# Patient Record
Sex: Female | Born: 1960
Health system: Southern US, Community
[De-identification: ages and names within clinical notes are randomized; demographics above are authoritative.]

## PROBLEM LIST (undated history)

## (undated) DIAGNOSIS — K219 Gastro-esophageal reflux disease without esophagitis: Secondary | ICD-10-CM

## (undated) DIAGNOSIS — T4145XA Adverse effect of unspecified anesthetic, initial encounter: Secondary | ICD-10-CM

## (undated) DIAGNOSIS — F429 Obsessive-compulsive disorder, unspecified: Secondary | ICD-10-CM

## (undated) DIAGNOSIS — R519 Headache, unspecified: Secondary | ICD-10-CM

## (undated) DIAGNOSIS — R569 Unspecified convulsions: Secondary | ICD-10-CM

## (undated) DIAGNOSIS — F909 Attention-deficit hyperactivity disorder, unspecified type: Secondary | ICD-10-CM

## (undated) DIAGNOSIS — S065X9A Traumatic subdural hemorrhage with loss of consciousness of unspecified duration, initial encounter: Secondary | ICD-10-CM

## (undated) DIAGNOSIS — I10 Essential (primary) hypertension: Secondary | ICD-10-CM

## (undated) DIAGNOSIS — R51 Headache: Secondary | ICD-10-CM

## (undated) DIAGNOSIS — T7840XA Allergy, unspecified, initial encounter: Secondary | ICD-10-CM

## (undated) DIAGNOSIS — T8859XA Other complications of anesthesia, initial encounter: Secondary | ICD-10-CM

## (undated) DIAGNOSIS — S0990XA Unspecified injury of head, initial encounter: Secondary | ICD-10-CM

## (undated) DIAGNOSIS — F4310 Post-traumatic stress disorder, unspecified: Secondary | ICD-10-CM

## (undated) DIAGNOSIS — F419 Anxiety disorder, unspecified: Secondary | ICD-10-CM

## (undated) HISTORY — PX: ABDOMINAL HYSTERECTOMY: SHX81

## (undated) HISTORY — DX: Allergy, unspecified, initial encounter: T78.40XA

## (undated) HISTORY — DX: Unspecified convulsions: R56.9

## (undated) HISTORY — DX: Attention-deficit hyperactivity disorder, unspecified type: F90.9

## (undated) HISTORY — DX: Obsessive-compulsive disorder, unspecified: F42.9

## (undated) HISTORY — DX: Post-traumatic stress disorder, unspecified: F43.10

---
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
## 2002-05-26 ENCOUNTER — Emergency Department (HOSPITAL_COMMUNITY): Admission: EM | Admit: 2002-05-26 | Discharge: 2002-05-26 | Payer: Self-pay | Admitting: Emergency Medicine

## 2006-08-07 ENCOUNTER — Emergency Department (HOSPITAL_COMMUNITY): Admission: EM | Admit: 2006-08-07 | Discharge: 2006-08-07 | Payer: Self-pay | Admitting: Emergency Medicine

## 2006-11-27 ENCOUNTER — Emergency Department (HOSPITAL_COMMUNITY): Admission: EM | Admit: 2006-11-27 | Discharge: 2006-11-27 | Payer: Self-pay | Admitting: Emergency Medicine

## 2007-02-09 ENCOUNTER — Encounter: Admission: RE | Admit: 2007-02-09 | Discharge: 2007-02-09 | Payer: Self-pay | Admitting: Family Medicine

## 2007-02-23 ENCOUNTER — Emergency Department (HOSPITAL_COMMUNITY): Admission: EM | Admit: 2007-02-23 | Discharge: 2007-02-23 | Payer: Self-pay | Admitting: Emergency Medicine

## 2008-02-23 ENCOUNTER — Emergency Department (HOSPITAL_COMMUNITY): Admission: EM | Admit: 2008-02-23 | Discharge: 2008-02-23 | Payer: Self-pay | Admitting: Emergency Medicine

## 2008-02-26 ENCOUNTER — Emergency Department (HOSPITAL_COMMUNITY): Admission: EM | Admit: 2008-02-26 | Discharge: 2008-02-26 | Payer: Self-pay | Admitting: Emergency Medicine

## 2008-02-27 ENCOUNTER — Emergency Department (HOSPITAL_COMMUNITY): Admission: EM | Admit: 2008-02-27 | Discharge: 2008-02-27 | Payer: Self-pay | Admitting: Emergency Medicine

## 2009-02-23 ENCOUNTER — Emergency Department (HOSPITAL_COMMUNITY): Admission: EM | Admit: 2009-02-23 | Discharge: 2009-02-23 | Payer: Self-pay | Admitting: Emergency Medicine

## 2009-04-13 ENCOUNTER — Emergency Department (HOSPITAL_COMMUNITY): Admission: EM | Admit: 2009-04-13 | Discharge: 2009-04-13 | Payer: Self-pay | Admitting: Emergency Medicine

## 2009-08-06 ENCOUNTER — Emergency Department (HOSPITAL_COMMUNITY): Admission: EM | Admit: 2009-08-06 | Discharge: 2009-08-06 | Payer: Self-pay | Admitting: Emergency Medicine

## 2009-08-08 ENCOUNTER — Emergency Department (HOSPITAL_COMMUNITY): Admission: EM | Admit: 2009-08-08 | Discharge: 2009-08-08 | Payer: Self-pay | Admitting: Emergency Medicine

## 2010-03-15 ENCOUNTER — Emergency Department (HOSPITAL_COMMUNITY): Admission: EM | Admit: 2010-03-15 | Discharge: 2010-03-15 | Payer: Self-pay | Admitting: Emergency Medicine

## 2010-04-11 ENCOUNTER — Emergency Department (HOSPITAL_COMMUNITY): Admission: EM | Admit: 2010-04-11 | Discharge: 2010-04-11 | Payer: Self-pay | Admitting: Emergency Medicine

## 2010-09-01 ENCOUNTER — Emergency Department (HOSPITAL_COMMUNITY)
Admission: EM | Admit: 2010-09-01 | Discharge: 2010-09-01 | Payer: Self-pay | Source: Home / Self Care | Admitting: Emergency Medicine

## 2010-10-14 ENCOUNTER — Encounter: Payer: Self-pay | Admitting: Family Medicine

## 2010-12-04 LAB — URINALYSIS, ROUTINE W REFLEX MICROSCOPIC
Bilirubin Urine: NEGATIVE
Nitrite: POSITIVE — AB
Protein, ur: 100 mg/dL — AB
Specific Gravity, Urine: 1.022 (ref 1.005–1.030)
Urobilinogen, UA: 0.2 mg/dL (ref 0.0–1.0)

## 2010-12-04 LAB — URINE MICROSCOPIC-ADD ON

## 2010-12-05 ENCOUNTER — Emergency Department (HOSPITAL_COMMUNITY)
Admission: EM | Admit: 2010-12-05 | Discharge: 2010-12-05 | Disposition: A | Discharge status: Home / Self Care | Payer: Self-pay | Attending: Emergency Medicine | Admitting: Emergency Medicine

## 2010-12-05 ENCOUNTER — Emergency Department (HOSPITAL_COMMUNITY): Payer: Self-pay

## 2010-12-05 DIAGNOSIS — K5289 Other specified noninfective gastroenteritis and colitis: Secondary | ICD-10-CM | POA: Insufficient documentation

## 2010-12-05 DIAGNOSIS — R109 Unspecified abdominal pain: Secondary | ICD-10-CM | POA: Insufficient documentation

## 2010-12-05 LAB — DIFFERENTIAL
Basophils Absolute: 0.1 10*3/uL (ref 0.0–0.1)
Basophils Relative: 1 % (ref 0–1)
Eosinophils Absolute: 0.2 10*3/uL (ref 0.0–0.7)
Eosinophils Relative: 2 % (ref 0–5)
Lymphocytes Relative: 21 % (ref 12–46)
Lymphs Abs: 2.8 K/uL (ref 0.7–4.0)
Monocytes Absolute: 0.8 10*3/uL (ref 0.1–1.0)
Monocytes Relative: 6 % (ref 3–12)
Neutro Abs: 9.3 K/uL — ABNORMAL HIGH (ref 1.7–7.7)
Neutrophils Relative %: 71 % (ref 43–77)

## 2010-12-05 LAB — URINALYSIS, ROUTINE W REFLEX MICROSCOPIC
Bilirubin Urine: NEGATIVE
Glucose, UA: NEGATIVE mg/dL
Hgb urine dipstick: NEGATIVE
Ketones, ur: NEGATIVE mg/dL
Nitrite: NEGATIVE
Protein, ur: NEGATIVE mg/dL
Specific Gravity, Urine: 1.027 (ref 1.005–1.030)
Urobilinogen, UA: 0.2 mg/dL (ref 0.0–1.0)
pH: 6 (ref 5.0–8.0)

## 2010-12-05 LAB — COMPREHENSIVE METABOLIC PANEL
ALT: 13 U/L (ref 0–35)
Albumin: 4.1 g/dL (ref 3.5–5.2)
Calcium: 9.4 mg/dL (ref 8.4–10.5)
Glucose, Bld: 115 mg/dL — ABNORMAL HIGH (ref 70–99)
Sodium: 137 mEq/L (ref 135–145)
Total Protein: 7.5 g/dL (ref 6.0–8.3)

## 2010-12-05 LAB — CBC
HCT: 43.7 % (ref 36.0–46.0)
Hemoglobin: 14.9 g/dL (ref 12.0–15.0)
MCH: 30.9 pg (ref 26.0–34.0)
MCHC: 34.1 g/dL (ref 30.0–36.0)
MCV: 90.7 fL (ref 78.0–100.0)
Platelets: 375 10*3/uL (ref 150–400)
RBC: 4.82 MIL/uL (ref 3.87–5.11)
RDW: 12.3 % (ref 11.5–15.5)
WBC: 13.1 K/uL — ABNORMAL HIGH (ref 4.0–10.5)

## 2010-12-05 LAB — WET PREP, GENITAL
Trich, Wet Prep: NONE SEEN
Yeast Wet Prep HPF POC: NONE SEEN

## 2010-12-05 LAB — COMPREHENSIVE METABOLIC PANEL WITH GFR
AST: 19 U/L (ref 0–37)
Alkaline Phosphatase: 64 U/L (ref 39–117)
BUN: 11 mg/dL (ref 6–23)
CO2: 28 meq/L (ref 19–32)
Chloride: 101 meq/L (ref 96–112)
Creatinine, Ser: 0.73 mg/dL (ref 0.4–1.2)
GFR calc Af Amer: >60 mL/min (ref >60)
GFR calc non Af Amer: >60 mL/min (ref >60)
Potassium: 3.4 meq/L — ABNORMAL LOW (ref 3.5–5.1)
Total Bilirubin: 0.6 mg/dL (ref 0.3–1.2)

## 2010-12-05 LAB — LIPASE, BLOOD: Lipase: 22 U/L (ref 11–59)

## 2010-12-05 MED ORDER — IOHEXOL 300 MG/ML  SOLN
100.0000 mL | Freq: Once | INTRAMUSCULAR | Status: AC | PRN
  Administered 2010-12-05: 100 mL via INTRAVENOUS

## 2010-12-06 LAB — GC/CHLAMYDIA PROBE AMP, GENITAL
Chlamydia, DNA Probe: NEGATIVE
GC Probe Amp, Genital: NEGATIVE

## 2010-12-09 LAB — DIFFERENTIAL
Basophils Absolute: 0 10*3/uL (ref 0.0–0.1)
Lymphocytes Relative: 20 % (ref 12–46)
Lymphs Abs: 1.8 10*3/uL (ref 0.7–4.0)
Neutro Abs: 7.1 10*3/uL (ref 1.7–7.7)

## 2010-12-09 LAB — COMPREHENSIVE METABOLIC PANEL
BUN: 7 mg/dL (ref 6–23)
CO2: 28 mEq/L (ref 19–32)
Calcium: 9.5 mg/dL (ref 8.4–10.5)
Chloride: 103 mEq/L (ref 96–112)
Creatinine, Ser: 0.82 mg/dL (ref 0.4–1.2)
GFR calc non Af Amer: >60 mL/min (ref >60)
Total Bilirubin: 0.8 mg/dL (ref 0.3–1.2)

## 2010-12-09 LAB — CBC
Hemoglobin: 15.1 g/dL — ABNORMAL HIGH (ref 12.0–15.0)
MCH: 30.8 pg (ref 26.0–34.0)
MCHC: 33.2 g/dL (ref 30.0–36.0)
MCV: 92.6 fL (ref 78.0–100.0)
Platelets: 365 10*3/uL (ref 150–400)
RBC: 4.91 MIL/uL (ref 3.87–5.11)

## 2010-12-09 LAB — URINALYSIS, ROUTINE W REFLEX MICROSCOPIC
Nitrite: NEGATIVE
Specific Gravity, Urine: 1.024 (ref 1.005–1.030)
Urobilinogen, UA: 1 mg/dL (ref 0.0–1.0)

## 2010-12-09 LAB — URINE MICROSCOPIC-ADD ON

## 2010-12-09 LAB — LIPASE, BLOOD: Lipase: 22 U/L (ref 11–59)

## 2010-12-09 LAB — URINE CULTURE

## 2010-12-26 LAB — URINALYSIS, ROUTINE W REFLEX MICROSCOPIC
Ketones, ur: 15 mg/dL — AB
Nitrite: NEGATIVE
Specific Gravity, Urine: 1.028 (ref 1.005–1.030)
pH: 7 (ref 5.0–8.0)

## 2010-12-26 LAB — CBC
HCT: 43.5 % (ref 36.0–46.0)
Hemoglobin: 14.6 g/dL (ref 12.0–15.0)
MCV: 87.9 fL (ref 78.0–100.0)
Platelets: 355 10*3/uL (ref 150–400)

## 2010-12-26 LAB — COMPREHENSIVE METABOLIC PANEL
Albumin: 4.2 g/dL (ref 3.5–5.2)
Alkaline Phosphatase: 65 U/L (ref 39–117)
BUN: 9 mg/dL (ref 6–23)
Chloride: 102 mEq/L (ref 96–112)
Creatinine, Ser: 0.78 mg/dL (ref 0.4–1.2)
GFR calc non Af Amer: >60 mL/min (ref >60)
Glucose, Bld: 106 mg/dL — ABNORMAL HIGH (ref 70–99)
Potassium: 3.9 mEq/L (ref 3.5–5.1)
Total Bilirubin: 0.7 mg/dL (ref 0.3–1.2)

## 2010-12-26 LAB — DIFFERENTIAL
Basophils Absolute: 0 10*3/uL (ref 0.0–0.1)
Basophils Relative: 0 % (ref 0–1)
Lymphocytes Relative: 25 % (ref 12–46)
Neutro Abs: 6.8 10*3/uL (ref 1.7–7.7)
Neutrophils Relative %: 69 % (ref 43–77)

## 2011-01-12 ENCOUNTER — Emergency Department (HOSPITAL_COMMUNITY)
Admission: EM | Admit: 2011-01-12 | Discharge: 2011-01-12 | Disposition: A | Discharge status: Home / Self Care | Payer: Self-pay | Attending: Emergency Medicine | Admitting: Emergency Medicine

## 2011-01-12 DIAGNOSIS — J45909 Unspecified asthma, uncomplicated: Secondary | ICD-10-CM | POA: Insufficient documentation

## 2011-01-12 DIAGNOSIS — J309 Allergic rhinitis, unspecified: Secondary | ICD-10-CM | POA: Insufficient documentation

## 2011-01-28 ENCOUNTER — Emergency Department (HOSPITAL_COMMUNITY)
Admission: EM | Admit: 2011-01-28 | Discharge: 2011-01-29 | Disposition: A | Discharge status: Home / Self Care | Payer: Self-pay | Attending: Emergency Medicine | Admitting: Emergency Medicine

## 2011-01-28 DIAGNOSIS — S0010XA Contusion of unspecified eyelid and periocular area, initial encounter: Secondary | ICD-10-CM | POA: Insufficient documentation

## 2011-01-28 DIAGNOSIS — M5030 Other cervical disc degeneration, unspecified cervical region: Secondary | ICD-10-CM | POA: Insufficient documentation

## 2011-01-28 DIAGNOSIS — M25569 Pain in unspecified knee: Secondary | ICD-10-CM | POA: Insufficient documentation

## 2011-01-28 DIAGNOSIS — IMO0002 Reserved for concepts with insufficient information to code with codable children: Secondary | ICD-10-CM | POA: Insufficient documentation

## 2011-01-28 DIAGNOSIS — T07XXXA Unspecified multiple injuries, initial encounter: Secondary | ICD-10-CM | POA: Insufficient documentation

## 2011-01-28 DIAGNOSIS — S060X9A Concussion with loss of consciousness of unspecified duration, initial encounter: Secondary | ICD-10-CM | POA: Insufficient documentation

## 2011-01-29 ENCOUNTER — Encounter (HOSPITAL_COMMUNITY): Payer: Self-pay

## 2011-01-29 ENCOUNTER — Emergency Department (HOSPITAL_COMMUNITY): Payer: Self-pay

## 2011-01-29 LAB — URINALYSIS, ROUTINE W REFLEX MICROSCOPIC
Hgb urine dipstick: NEGATIVE
Nitrite: NEGATIVE
Specific Gravity, Urine: 1.017 (ref 1.005–1.030)
Urobilinogen, UA: 0.2 mg/dL (ref 0.0–1.0)
pH: 6 (ref 5.0–8.0)

## 2011-01-29 LAB — RAPID URINE DRUG SCREEN, HOSP PERFORMED
Amphetamines: NOT DETECTED
Barbiturates: NOT DETECTED
Benzodiazepines: NOT DETECTED
Cocaine: POSITIVE — AB
Cocaine: NOT DETECTED
Opiates: NOT DETECTED
Tetrahydrocannabinol: NOT DETECTED

## 2011-01-29 LAB — BASIC METABOLIC PANEL
CO2: 24 mEq/L (ref 19–32)
Calcium: 9.6 mg/dL (ref 8.4–10.5)
GFR calc Af Amer: >60 mL/min (ref >60)
Potassium: 3.9 mEq/L (ref 3.5–5.1)
Sodium: 136 mEq/L (ref 135–145)

## 2011-01-29 LAB — CBC
HCT: 39.5 % (ref 36.0–46.0)
Hemoglobin: 13.7 g/dL (ref 12.0–15.0)
MCV: 89 fL (ref 78.0–100.0)
RBC: 4.44 MIL/uL (ref 3.87–5.11)
RDW: 12.2 % (ref 11.5–15.5)
WBC: 10.3 10*3/uL (ref 4.0–10.5)

## 2011-01-29 LAB — DIFFERENTIAL
Basophils Absolute: 0.1 10*3/uL (ref 0.0–0.1)
Eosinophils Relative: 1 % (ref 0–5)
Lymphocytes Relative: 23 % (ref 12–46)
Lymphs Abs: 2.4 10*3/uL (ref 0.7–4.0)
Neutro Abs: 7.1 10*3/uL (ref 1.7–7.7)
Neutrophils Relative %: 69 % (ref 43–77)

## 2011-01-31 ENCOUNTER — Emergency Department (HOSPITAL_COMMUNITY)
Admission: EM | Admit: 2011-01-31 | Discharge: 2011-01-31 | Disposition: A | Discharge status: Home / Self Care | Payer: Self-pay | Attending: Emergency Medicine | Admitting: Emergency Medicine

## 2011-01-31 ENCOUNTER — Emergency Department (HOSPITAL_COMMUNITY): Payer: Self-pay

## 2011-01-31 DIAGNOSIS — S02400A Malar fracture unspecified, initial encounter for closed fracture: Secondary | ICD-10-CM | POA: Insufficient documentation

## 2011-01-31 DIAGNOSIS — H20049 Secondary noninfectious iridocyclitis, unspecified eye: Secondary | ICD-10-CM | POA: Insufficient documentation

## 2011-01-31 DIAGNOSIS — S02401A Maxillary fracture, unspecified, initial encounter for closed fracture: Secondary | ICD-10-CM | POA: Insufficient documentation

## 2011-01-31 DIAGNOSIS — S058X9A Other injuries of unspecified eye and orbit, initial encounter: Secondary | ICD-10-CM | POA: Insufficient documentation

## 2011-01-31 DIAGNOSIS — S0280XA Fracture of other specified skull and facial bones, unspecified side, initial encounter for closed fracture: Secondary | ICD-10-CM | POA: Insufficient documentation

## 2011-02-26 ENCOUNTER — Emergency Department (HOSPITAL_COMMUNITY)
Admission: EM | Admit: 2011-02-26 | Discharge: 2011-02-26 | Disposition: A | Discharge status: Home / Self Care | Payer: Self-pay | Attending: Emergency Medicine | Admitting: Emergency Medicine

## 2011-02-26 DIAGNOSIS — G44309 Post-traumatic headache, unspecified, not intractable: Secondary | ICD-10-CM | POA: Insufficient documentation

## 2011-02-26 DIAGNOSIS — F0781 Postconcussional syndrome: Secondary | ICD-10-CM | POA: Insufficient documentation

## 2011-02-26 DIAGNOSIS — IMO0001 Reserved for inherently not codable concepts without codable children: Secondary | ICD-10-CM | POA: Insufficient documentation

## 2011-02-26 DIAGNOSIS — IMO0002 Reserved for concepts with insufficient information to code with codable children: Secondary | ICD-10-CM | POA: Insufficient documentation

## 2011-03-14 ENCOUNTER — Emergency Department (HOSPITAL_COMMUNITY)
Admission: EM | Admit: 2011-03-14 | Discharge: 2011-03-14 | Disposition: A | Discharge status: Home / Self Care | Payer: Self-pay | Attending: Emergency Medicine | Admitting: Emergency Medicine

## 2011-03-14 ENCOUNTER — Emergency Department (HOSPITAL_COMMUNITY): Payer: Self-pay

## 2011-03-14 DIAGNOSIS — X58XXXA Exposure to other specified factors, initial encounter: Secondary | ICD-10-CM | POA: Insufficient documentation

## 2011-03-14 DIAGNOSIS — S99919A Unspecified injury of unspecified ankle, initial encounter: Secondary | ICD-10-CM | POA: Insufficient documentation

## 2011-03-14 DIAGNOSIS — S7010XA Contusion of unspecified thigh, initial encounter: Secondary | ICD-10-CM | POA: Insufficient documentation

## 2011-03-14 DIAGNOSIS — H5710 Ocular pain, unspecified eye: Secondary | ICD-10-CM | POA: Insufficient documentation

## 2011-03-14 DIAGNOSIS — R51 Headache: Secondary | ICD-10-CM | POA: Insufficient documentation

## 2011-03-14 DIAGNOSIS — M79609 Pain in unspecified limb: Secondary | ICD-10-CM | POA: Insufficient documentation

## 2011-03-14 DIAGNOSIS — S8010XA Contusion of unspecified lower leg, initial encounter: Secondary | ICD-10-CM | POA: Insufficient documentation

## 2011-03-14 DIAGNOSIS — F0781 Postconcussional syndrome: Secondary | ICD-10-CM | POA: Insufficient documentation

## 2011-03-14 DIAGNOSIS — M549 Dorsalgia, unspecified: Secondary | ICD-10-CM | POA: Insufficient documentation

## 2011-03-14 DIAGNOSIS — S8990XA Unspecified injury of unspecified lower leg, initial encounter: Secondary | ICD-10-CM | POA: Insufficient documentation

## 2011-03-14 DIAGNOSIS — G8929 Other chronic pain: Secondary | ICD-10-CM | POA: Insufficient documentation

## 2011-04-07 ENCOUNTER — Emergency Department (HOSPITAL_COMMUNITY)
Admission: EM | Admit: 2011-04-07 | Discharge: 2011-04-07 | Disposition: A | Discharge status: Home / Self Care | Payer: Self-pay | Attending: Emergency Medicine | Admitting: Emergency Medicine

## 2011-04-07 DIAGNOSIS — X58XXXS Exposure to other specified factors, sequela: Secondary | ICD-10-CM | POA: Insufficient documentation

## 2011-04-07 DIAGNOSIS — F17200 Nicotine dependence, unspecified, uncomplicated: Secondary | ICD-10-CM | POA: Insufficient documentation

## 2011-04-07 DIAGNOSIS — F0781 Postconcussional syndrome: Secondary | ICD-10-CM | POA: Insufficient documentation

## 2011-04-07 DIAGNOSIS — G44309 Post-traumatic headache, unspecified, not intractable: Secondary | ICD-10-CM | POA: Insufficient documentation

## 2011-04-07 DIAGNOSIS — IMO0001 Reserved for inherently not codable concepts without codable children: Secondary | ICD-10-CM | POA: Insufficient documentation

## 2011-04-14 ENCOUNTER — Emergency Department (HOSPITAL_COMMUNITY)
Admission: EM | Admit: 2011-04-14 | Discharge: 2011-04-14 | Disposition: A | Discharge status: Home / Self Care | Payer: Self-pay | Attending: Emergency Medicine | Admitting: Emergency Medicine

## 2011-04-14 DIAGNOSIS — R51 Headache: Secondary | ICD-10-CM | POA: Insufficient documentation

## 2011-04-14 DIAGNOSIS — Z79899 Other long term (current) drug therapy: Secondary | ICD-10-CM | POA: Insufficient documentation

## 2011-04-24 ENCOUNTER — Emergency Department (HOSPITAL_COMMUNITY)
Admission: EM | Admit: 2011-04-24 | Discharge: 2011-04-24 | Disposition: A | Discharge status: Home / Self Care | Payer: Self-pay | Attending: Emergency Medicine | Admitting: Emergency Medicine

## 2011-04-24 DIAGNOSIS — G8929 Other chronic pain: Secondary | ICD-10-CM | POA: Insufficient documentation

## 2011-04-24 DIAGNOSIS — R51 Headache: Secondary | ICD-10-CM | POA: Insufficient documentation

## 2011-04-24 DIAGNOSIS — Z8781 Personal history of (healed) traumatic fracture: Secondary | ICD-10-CM | POA: Insufficient documentation

## 2011-05-12 ENCOUNTER — Emergency Department (HOSPITAL_COMMUNITY): Payer: Self-pay

## 2011-05-12 ENCOUNTER — Emergency Department (HOSPITAL_COMMUNITY)
Admission: EM | Admit: 2011-05-12 | Discharge: 2011-05-12 | Disposition: A | Discharge status: Home / Self Care | Payer: Self-pay | Attending: Emergency Medicine | Admitting: Emergency Medicine

## 2011-05-12 DIAGNOSIS — R609 Edema, unspecified: Secondary | ICD-10-CM | POA: Insufficient documentation

## 2011-05-12 DIAGNOSIS — X500XXA Overexertion from strenuous movement or load, initial encounter: Secondary | ICD-10-CM | POA: Insufficient documentation

## 2011-05-12 DIAGNOSIS — IMO0002 Reserved for concepts with insufficient information to code with codable children: Secondary | ICD-10-CM | POA: Insufficient documentation

## 2011-05-12 DIAGNOSIS — M25579 Pain in unspecified ankle and joints of unspecified foot: Secondary | ICD-10-CM | POA: Insufficient documentation

## 2011-05-25 DIAGNOSIS — S065XAA Traumatic subdural hemorrhage with loss of consciousness status unknown, initial encounter: Secondary | ICD-10-CM

## 2011-05-25 DIAGNOSIS — S065X9A Traumatic subdural hemorrhage with loss of consciousness of unspecified duration, initial encounter: Secondary | ICD-10-CM

## 2011-05-25 HISTORY — DX: Traumatic subdural hemorrhage with loss of consciousness of unspecified duration, initial encounter: S06.5X9A

## 2011-05-25 HISTORY — DX: Traumatic subdural hemorrhage with loss of consciousness status unknown, initial encounter: S06.5XAA

## 2011-06-05 ENCOUNTER — Emergency Department (HOSPITAL_COMMUNITY): Payer: Self-pay

## 2011-06-05 ENCOUNTER — Emergency Department (HOSPITAL_COMMUNITY)
Admission: EM | Admit: 2011-06-05 | Discharge: 2011-06-06 | Disposition: A | Discharge status: Other Acute Inpatient Hospital | Payer: Self-pay | Attending: Emergency Medicine | Admitting: Emergency Medicine

## 2011-06-05 DIAGNOSIS — S065X0A Traumatic subdural hemorrhage without loss of consciousness, initial encounter: Secondary | ICD-10-CM | POA: Insufficient documentation

## 2011-06-05 DIAGNOSIS — R51 Headache: Secondary | ICD-10-CM | POA: Insufficient documentation

## 2011-06-05 LAB — URINALYSIS, ROUTINE W REFLEX MICROSCOPIC
Bilirubin Urine: NEGATIVE
Protein, ur: NEGATIVE mg/dL
Urobilinogen, UA: 0.2 mg/dL (ref 0.0–1.0)

## 2011-06-06 ENCOUNTER — Inpatient Hospital Stay (HOSPITAL_COMMUNITY): Payer: Self-pay

## 2011-06-06 ENCOUNTER — Inpatient Hospital Stay (HOSPITAL_COMMUNITY)
Admission: AD | Admit: 2011-06-06 | Discharge: 2011-06-11 | DRG: 087 | Disposition: A | Discharge status: Home / Self Care | Payer: Self-pay | Source: Other Acute Inpatient Hospital | Attending: Neurological Surgery | Admitting: Neurological Surgery

## 2011-06-06 DIAGNOSIS — F0781 Postconcussional syndrome: Secondary | ICD-10-CM | POA: Diagnosis present

## 2011-06-06 DIAGNOSIS — S069X9S Unspecified intracranial injury with loss of consciousness of unspecified duration, sequela: Secondary | ICD-10-CM

## 2011-06-06 DIAGNOSIS — I10 Essential (primary) hypertension: Secondary | ICD-10-CM | POA: Diagnosis present

## 2011-06-06 DIAGNOSIS — S069XAS Unspecified intracranial injury with loss of consciousness status unknown, sequela: Secondary | ICD-10-CM

## 2011-06-06 DIAGNOSIS — F17200 Nicotine dependence, unspecified, uncomplicated: Secondary | ICD-10-CM | POA: Diagnosis present

## 2011-06-06 DIAGNOSIS — W19XXXS Unspecified fall, sequela: Secondary | ICD-10-CM

## 2011-06-06 DIAGNOSIS — S065X0A Traumatic subdural hemorrhage without loss of consciousness, initial encounter: Principal | ICD-10-CM | POA: Diagnosis present

## 2011-06-06 DIAGNOSIS — Z79899 Other long term (current) drug therapy: Secondary | ICD-10-CM

## 2011-06-06 DIAGNOSIS — Z880 Allergy status to penicillin: Secondary | ICD-10-CM

## 2011-06-06 DIAGNOSIS — G44319 Acute post-traumatic headache, not intractable: Secondary | ICD-10-CM | POA: Diagnosis present

## 2011-06-06 DIAGNOSIS — R569 Unspecified convulsions: Secondary | ICD-10-CM | POA: Diagnosis present

## 2011-06-06 DIAGNOSIS — Y9289 Other specified places as the place of occurrence of the external cause: Secondary | ICD-10-CM

## 2011-06-06 LAB — CBC
HCT: 40.3 % (ref 36.0–46.0)
Hemoglobin: 14.1 g/dL (ref 12.0–15.0)
RBC: 4.6 MIL/uL (ref 3.87–5.11)
WBC: 11 10*3/uL — ABNORMAL HIGH (ref 4.0–10.5)

## 2011-06-06 LAB — COMPREHENSIVE METABOLIC PANEL
Albumin: 3.7 g/dL (ref 3.5–5.2)
Alkaline Phosphatase: 63 U/L (ref 39–117)
BUN: 10 mg/dL (ref 6–23)
CO2: 23 mEq/L (ref 19–32)
Chloride: 104 mEq/L (ref 96–112)
GFR calc Af Amer: >60 mL/min (ref >60)
Glucose, Bld: 108 mg/dL — ABNORMAL HIGH (ref 70–99)
Potassium: 3.7 mEq/L (ref 3.5–5.1)
Total Bilirubin: 0.5 mg/dL (ref 0.3–1.2)

## 2011-06-06 LAB — GLUCOSE, CAPILLARY
Glucose-Capillary: 130 mg/dL — ABNORMAL HIGH (ref 70–99)
Glucose-Capillary: 106 mg/dL — ABNORMAL HIGH (ref 70–99)
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
## 2011-07-11 LAB — URINALYSIS, ROUTINE W REFLEX MICROSCOPIC
Bilirubin Urine: NEGATIVE
Hgb urine dipstick: NEGATIVE
Ketones, ur: NEGATIVE
Protein, ur: NEGATIVE
Urobilinogen, UA: 0.2

## 2011-07-11 LAB — DIFFERENTIAL
Basophils Relative: 0
Eosinophils Absolute: 0.3
Monocytes Relative: 6
Neutrophils Relative %: 65

## 2011-07-11 LAB — CBC
MCHC: 33.5
MCV: 86.6
RBC: 4.32

## 2011-07-11 LAB — URINE MICROSCOPIC-ADD ON

## 2011-07-12 ENCOUNTER — Other Ambulatory Visit (HOSPITAL_COMMUNITY): Payer: Self-pay | Admitting: Family Medicine

## 2011-07-12 DIAGNOSIS — S0990XA Unspecified injury of head, initial encounter: Secondary | ICD-10-CM

## 2011-07-15 ENCOUNTER — Inpatient Hospital Stay (HOSPITAL_COMMUNITY)
Admission: RE | Admit: 2011-07-15 | Discharge: 2011-07-15 | Payer: Self-pay | Source: Ambulatory Visit | Attending: Family Medicine | Admitting: Family Medicine

## 2011-07-22 ENCOUNTER — Other Ambulatory Visit (HOSPITAL_COMMUNITY): Payer: Self-pay | Admitting: Family Medicine

## 2011-07-22 DIAGNOSIS — S0990XA Unspecified injury of head, initial encounter: Secondary | ICD-10-CM

## 2011-07-24 NOTE — Consult Note (Signed)
  Joy Barber, GABRIELSEN NO.:  [id number]  MEDICAL RECORD NO.:  [id number]  LOCATION:  3103                         FACILITY:  [facility]  PHYSICIAN:  Stefani Dama, M.D.  DATE OF BIRTH:  [birth year]  DATE OF CONSULTATION:  06/06/2011 DATE OF DISCHARGE:                                CONSULTATION   REQUESTOR:  Devoria Albe, MD  REASON FOR REQUEST:  Subdural hematoma.  HISTORY OF PRESENT ILLNESS:  The patient is a 50 year old individual who called about at approximately 2 in the morning regarding a presence of a subdural hematoma.  She was seen in the emergency department for increasing headache.  There was a remote history of trauma back in May of this year, and a subsequent CT scan did not show any subdural hematoma, but now, there is a subacute lesion on the right frontal region, which is measures at best a centimeter in thickness.  She was also explained that she has severe and unrelenting headaches and that she had two seizure episodes sometime in the past.  Because of the nature of this hematoma being a new finding, though the complaints are chronic, I have advised that the patient be admitted to the hospital to undergo further workup and monitoring to see if there is any evidence for seizure activity.  She will be transferred to [facility] in the intensive care unit.     Stefani Dama, M.D.     Merla Riches  D:  06/06/2011  T:  06/06/2011  Job:  [id number]  Electronically Signed by Barnett Abu M.D. on 07/24/2011 07:00:37 AM
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
## 2011-07-25 ENCOUNTER — Ambulatory Visit (HOSPITAL_COMMUNITY)
Admission: RE | Admit: 2011-07-25 | Discharge: 2011-07-25 | Disposition: A | Discharge status: Home / Self Care | Payer: Self-pay | Source: Ambulatory Visit | Attending: Family Medicine | Admitting: Family Medicine

## 2011-07-25 DIAGNOSIS — H538 Other visual disturbances: Secondary | ICD-10-CM | POA: Insufficient documentation

## 2011-07-25 DIAGNOSIS — S0990XA Unspecified injury of head, initial encounter: Secondary | ICD-10-CM

## 2012-01-08 ENCOUNTER — Other Ambulatory Visit: Payer: Self-pay | Admitting: Obstetrics and Gynecology

## 2012-01-08 DIAGNOSIS — Z1231 Encounter for screening mammogram for malignant neoplasm of breast: Secondary | ICD-10-CM

## 2012-01-14 ENCOUNTER — Ambulatory Visit: Payer: Self-pay

## 2012-01-14 ENCOUNTER — Ambulatory Visit (HOSPITAL_COMMUNITY): Payer: Self-pay | Attending: Obstetrics and Gynecology

## 2012-06-13 ENCOUNTER — Ambulatory Visit: Payer: Self-pay | Admitting: Emergency Medicine

## 2012-06-13 VITALS — BP 138/89 | HR 71 | Temp 98.2°F | Resp 18 | Ht 64.0 in | Wt 139.0 lb

## 2012-06-13 DIAGNOSIS — Z23 Encounter for immunization: Secondary | ICD-10-CM

## 2012-06-13 DIAGNOSIS — S0190XA Unspecified open wound of unspecified part of head, initial encounter: Secondary | ICD-10-CM
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
## 2012-07-07 ENCOUNTER — Emergency Department (HOSPITAL_COMMUNITY)
Admission: EM | Admit: 2012-07-07 | Discharge: 2012-07-07 | Disposition: A | Discharge status: Home / Self Care | Payer: Self-pay | Attending: Emergency Medicine | Admitting: Emergency Medicine

## 2012-07-07 ENCOUNTER — Encounter (HOSPITAL_COMMUNITY): Payer: Self-pay

## 2012-07-07 ENCOUNTER — Emergency Department (HOSPITAL_COMMUNITY): Payer: Self-pay

## 2012-07-07 DIAGNOSIS — J069 Acute upper respiratory infection, unspecified: Secondary | ICD-10-CM | POA: Insufficient documentation

## 2012-07-07 LAB — MONONUCLEOSIS SCREEN: Mono Screen: NEGATIVE

## 2012-07-07 LAB — CBC WITH DIFFERENTIAL/PLATELET
Basophils Absolute: 0 10*3/uL (ref 0.0–0.1)
Basophils Relative: 0 % (ref 0–1)
Eosinophils Absolute: 0.1 10*3/uL (ref 0.0–0.7)
MCH: 30.3 pg (ref 26.0–34.0)
MCHC: 34.2 g/dL (ref 30.0–36.0)
Neutrophils Relative %: 69 % (ref 43–77)
Platelets: 385 10*3/uL (ref 150–400)

## 2012-07-07 LAB — BASIC METABOLIC PANEL
GFR calc Af Amer: >90 mL/min (ref >90)
GFR calc non Af Amer: >90 mL/min (ref >90)
Potassium: 4.1 mEq/L (ref 3.5–5.1)
Sodium: 137 mEq/L (ref 135–145)

## 2012-07-07 LAB — RAPID STREP SCREEN (MED CTR MEBANE ONLY): Streptococcus, Group A Screen (Direct): NEGATIVE

## 2012-07-07 MED ORDER — AZITHROMYCIN 250 MG PO TABS: 250.0000 mg | ORAL_TABLET | Freq: Every day | ORAL | Status: DC

## 2012-07-07 MED ORDER — SODIUM CHLORIDE 0.9 % IV BOLUS (SEPSIS)
1000.0000 mL | Freq: Once | INTRAVENOUS | Status: AC
  Administered 2012-07-07: 1000 mL via INTRAVENOUS

## 2012-07-07 MED ORDER — TRAMADOL HCL 50 MG PO TABS: 50.0000 mg | ORAL_TABLET | Freq: Four times a day (QID) | ORAL | Status: DC | PRN

## 2012-07-07 MED ORDER — ONDANSETRON HCL 4 MG/2ML IJ SOLN
4.0000 mg | Freq: Once | INTRAMUSCULAR | Status: AC
  Administered 2012-07-07: 4 mg via INTRAVENOUS
  Filled 2012-07-07: qty 2

## 2012-07-07 MED ORDER — ALBUTEROL SULFATE HFA 108 (90 BASE) MCG/ACT IN AERS
2.0000 | INHALATION_SPRAY | RESPIRATORY_TRACT | Status: DC | PRN
  Administered 2012-07-07: 2 via RESPIRATORY_TRACT
  Filled 2012-07-07: qty 6.7

## 2012-07-07 MED ORDER — TRAMADOL HCL 50 MG PO TABS
50.0000 mg | ORAL_TABLET | Freq: Once | ORAL | Status: AC
  Administered 2012-07-07: 50 mg via ORAL
  Filled 2012-07-07: qty 1

## 2012-07-07 MED ORDER — AZITHROMYCIN 250 MG PO TABS
500.0000 mg | ORAL_TABLET | Freq: Once | ORAL | Status: AC
  Administered 2012-07-07: 500 mg via ORAL
  Filled 2012-07-07: qty 2

## 2012-07-07 NOTE — ED Notes (Signed)
Patient reports she has felt like she has the flu.  Her throat hurts and bilateral neck area is swollen and painful.  Pt has been coughing resulting in vomiting at times.  Pt is normally active and exercises and reports she has been sick since receiving TDAP and MMR vacination on around Sept. 21.

## 2012-07-07 NOTE — ED Notes (Signed)
Pt present with c/o lethargic, swolllen glands, nausea, and coughing.  Pt reports "I got sick 48 hours after taking MMR and Tdap vaccinations for school."  Pt reports chills and fever

## 2012-07-07 NOTE — ED Provider Notes (Signed)
History     CSN: [id number]  Arrival date & time 07/07/12  1535   First MD Initiated Contact with Patient 07/07/12 1716      Chief Complaint  Patient presents with  . Lymphadenopathy  . Nausea    (Consider location/radiation/quality/duration/timing/severity/associated sxs/prior treatment) HPI  Pt to the ER with multiple complaints. She received the MMR 3 weeks ago and ever since then she has been feeling fatigued, sore throat, swollen tonsils, post tussive vomiting with nausea and myalgias. She denies having fevers but admits to subjective fevers and chills. She does have a PMH of TBI from 2012.  She says that that has been stable. Pt is healthy at baseline and exercises almost daily, no chronic ongoing conditions. NAD/VSS  No past medical history on file.  Past Surgical History  Procedure Date  . Brain surgery post concusive syndrome, tramatic brain injury, seizures, subdural hematoma,    No family history on file.  History  Substance Use Topics  . Smoking status: Former Games developer  . Smokeless tobacco: Not on file  . Alcohol Use: Not on file    OB History    Grav Para Term Preterm Abortions TAB SAB Ect Mult Living                  Review of Systems  Constitutional: Positive for fever and chills.  HENT: Negative for ear pain and ear discharge.   Eyes: Negative for redness and itching.  Respiratory: Positive for cough. Negative for chest tightness, shortness of breath and wheezing.   Cardiovascular: Negative for chest pain and leg swelling.  Genitourinary: Negative for dysuria.  Musculoskeletal: Negative for back pain and arthralgias.  Neurological: Negative for dizziness.  Hematological: Negative for adenopathy.  Psychiatric/Behavioral: Negative for agitation.      Allergies  Penicillins  Home Medications   Current Outpatient Rx  Name Route Sig Dispense Refill  . PRESCRIPTION MEDICATION Oral Take 1 capsule by mouth daily. Piracetam 250mg  capsule for  "mental health".    . TRAMADOL HCL 50 MG PO TABS Oral Take 50 mg by mouth every 6 (six) hours as needed. For pain.    Marland Kitchen AZITHROMYCIN 250 MG PO TABS Oral Take 1 tablet (250 mg total) by mouth daily. Take first 2 tablets together, then 1 every day until finished. 4 tablet 0    Start on 07/08/2012  . TRAMADOL HCL 50 MG PO TABS Oral Take 1 tablet (50 mg total) by mouth every 6 (six) hours as needed for pain. 15 tablet 0    BP 121/75  Pulse 69  Temp 98.3 F (36.8 C) (Oral)  Resp 14  SpO2 100%  Physical Exam  Nursing note and vitals reviewed. Constitutional: She appears well-developed and well-nourished. No distress.  HENT:  Head: Normocephalic and atraumatic.  Eyes: Pupils are equal, round, and reactive to light.  Neck: Normal range of motion. Neck supple.  Cardiovascular: Normal rate and regular rhythm.   Pulmonary/Chest: Effort normal. No respiratory distress. She has no wheezes.  Abdominal: Soft. There is no tenderness. There is no rebound.  Lymphadenopathy:       Head (right side): Tonsillar adenopathy present.       Head (left side): Tonsillar adenopathy present.    She has no cervical adenopathy.    She has no axillary adenopathy.  Neurological: She is alert.  Skin: Skin is warm and dry.    ED Course  Procedures (including critical care time)  Labs Reviewed  CBC WITH DIFFERENTIAL - Abnormal;  Notable for the following:    WBC 13.5 (*)     Neutro Abs 9.4 (*)     Monocytes Absolute 1.1 (*)     All other components within normal limits  BASIC METABOLIC PANEL  RAPID STREP SCREEN  MONONUCLEOSIS SCREEN   Dg Chest 2 View  07/07/2012  *RADIOLOGY REPORT*  Clinical Data: Cough.  CHEST - 2 VIEW  Comparison: 02/23/2007  Findings: The cardiac silhouette, mediastinal and hilar contours are normal.  The lungs are clear.  No pleural effusion.  The bony thorax is intact.  IMPRESSION: No acute cardiopulmonary findings.   Original Report Authenticated By: P. Loralie Champagne, M.D.       1. URI (upper respiratory infection)       MDM  Labs unremarkable. Pt most likely has URI. No concerning findings on labs or physical exam. Will give abx, inhaler and tramadol. Pt needs to rest. Admits to not eating well like she should.  Pt has been advised of the symptoms that warrant their return to the ED. Patient has voiced understanding and has agreed to follow-up with the PCP or specialist.         Dorthula Matas, PA 07/07/12 1939

## 2012-07-08 MED ORDER — CEPHALEXIN 500 MG PO CAPS: 500.0000 mg | ORAL_CAPSULE | Freq: Four times a day (QID) | ORAL | Status: DC

## 2012-07-08 NOTE — ED Provider Notes (Signed)
Medical screening examination/treatment/procedure(s) were performed by non-physician practitioner and as supervising physician I was immediately available for consultation/collaboration.  Doug Sou, MD 07/08/12 [phone]

## 2012-08-05 ENCOUNTER — Telehealth: Payer: Self-pay

## 2012-08-05 ENCOUNTER — Encounter: Payer: Self-pay | Admitting: Radiology

## 2012-08-05 NOTE — Telephone Encounter (Signed)
Patient dropped off paperwork about 3-4 days ago for Dr. Cleta Alberts to fill out from [facility] stating patients accommodations. Patient states that person at front desk said she'd hand deliver papers to Dr. Cleta Alberts but I don't have any notes stating this happened and nothing was found in Dr. Ellis Parents box. Please check and see if anyone knows about these papers and where they might be. (They are not in pick up drawer either, but patient states that she also had a self addressed envelope, so there is a chance it was filled out and mailed already.) Please call patient to give her an update on these papers. [phone]

## 2012-08-05 NOTE — Telephone Encounter (Signed)
Called pt to advise I had completed letter and mailed to her.
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
## 2012-10-19 ENCOUNTER — Encounter (HOSPITAL_COMMUNITY): Payer: Self-pay | Admitting: *Deleted

## 2012-10-19 ENCOUNTER — Emergency Department (HOSPITAL_COMMUNITY)
Admission: EM | Admit: 2012-10-19 | Discharge: 2012-10-20 | Disposition: A | Discharge status: Home / Self Care | Payer: Self-pay | Attending: Emergency Medicine | Admitting: Emergency Medicine

## 2012-10-19 DIAGNOSIS — Z87820 Personal history of traumatic brain injury: Secondary | ICD-10-CM | POA: Insufficient documentation

## 2012-10-19 DIAGNOSIS — Z79899 Other long term (current) drug therapy: Secondary | ICD-10-CM | POA: Insufficient documentation

## 2012-10-19 DIAGNOSIS — F17200 Nicotine dependence, unspecified, uncomplicated: Secondary | ICD-10-CM | POA: Insufficient documentation

## 2012-10-19 DIAGNOSIS — Z9889 Other specified postprocedural states: Secondary | ICD-10-CM | POA: Insufficient documentation

## 2012-10-19 DIAGNOSIS — F0781 Postconcussional syndrome: Secondary | ICD-10-CM | POA: Insufficient documentation

## 2012-10-19 HISTORY — DX: Unspecified injury of head, initial encounter: S09.90XA

## 2012-10-19 HISTORY — DX: Traumatic subdural hemorrhage with loss of consciousness of unspecified duration, initial encounter: S06.5X9A

## 2012-10-19 LAB — POCT I-STAT, CHEM 8
Creatinine, Ser: 1 mg/dL (ref 0.50–1.10)
Hemoglobin: 14.6 g/dL (ref 12.0–15.0)
Sodium: 144 mEq/L (ref 135–145)
TCO2: 28 mmol/L (ref 0–100)

## 2012-10-19 MED ORDER — DIPHENHYDRAMINE HCL 50 MG/ML IJ SOLN
25.0000 mg | Freq: Once | INTRAMUSCULAR | Status: AC
Start: 2012-10-19 — End: 2012-10-20
  Administered 2012-10-20: 25 mg via INTRAVENOUS
  Filled 2012-10-19: qty 1

## 2012-10-19 MED ORDER — METOCLOPRAMIDE HCL 5 MG/ML IJ SOLN
10.0000 mg | Freq: Once | INTRAMUSCULAR | Status: AC
  Administered 2012-10-20: 10 mg via INTRAVENOUS
  Filled 2012-10-19: qty 2

## 2012-10-19 MED ORDER — SODIUM CHLORIDE 0.9 % IV BOLUS (SEPSIS)
1000.0000 mL | Freq: Once | INTRAVENOUS | Status: AC
  Administered 2012-10-19: 1000 mL via INTRAVENOUS

## 2012-10-19 MED ORDER — DIAZEPAM 5 MG PO TABS
5.0000 mg | ORAL_TABLET | Freq: Once | ORAL | Status: AC
  Administered 2012-10-20: 5 mg via ORAL
  Filled 2012-10-19: qty 1

## 2012-10-19 MED ORDER — KETOROLAC TROMETHAMINE 30 MG/ML IJ SOLN
30.0000 mg | Freq: Once | INTRAMUSCULAR | Status: AC
  Administered 2012-10-20: 30 mg via INTRAVENOUS
  Filled 2012-10-19: qty 1
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
## 2012-10-20 MED ORDER — BUSPIRONE HCL 5 MG PO TABS: 5.0000 mg | ORAL_TABLET | Freq: Three times a day (TID) | ORAL | Status: DC

## 2012-10-20 MED ORDER — TRAMADOL HCL 50 MG PO TABS: 50.0000 mg | ORAL_TABLET | Freq: Four times a day (QID) | ORAL | Status: DC | PRN

## 2012-10-20 NOTE — ED Provider Notes (Signed)
Medical screening examination/treatment/procedure(s) were performed by non-physician practitioner and as supervising physician I was immediately available for consultation/collaboration.   Gilda Crease, MD 10/20/12 [phone]

## 2012-11-10 ENCOUNTER — Ambulatory Visit: Payer: Self-pay | Admitting: Family Medicine

## 2012-11-10 VITALS — BP 130/80 | HR 81 | Temp 97.6°F | Resp 17 | Ht 64.5 in | Wt 139.0 lb

## 2012-11-10 DIAGNOSIS — J029 Acute pharyngitis, unspecified: Secondary | ICD-10-CM

## 2012-11-10 DIAGNOSIS — F909 Attention-deficit hyperactivity disorder, unspecified type: Secondary | ICD-10-CM

## 2012-11-10 DIAGNOSIS — S069X0S Unspecified intracranial injury without loss of consciousness, sequela: Secondary | ICD-10-CM

## 2012-11-10 DIAGNOSIS — S069XAS Unspecified intracranial injury with loss of consciousness status unknown, sequela: Secondary | ICD-10-CM

## 2012-11-10 DIAGNOSIS — S069X9S Unspecified intracranial injury with loss of consciousness of unspecified duration, sequela: Secondary | ICD-10-CM

## 2012-11-10 DIAGNOSIS — F411 Generalized anxiety disorder: Secondary | ICD-10-CM

## 2012-11-10 MED ORDER — TRAMADOL HCL 50 MG PO TABS: 50.0000 mg | ORAL_TABLET | Freq: Three times a day (TID) | ORAL | Status: DC | PRN

## 2012-11-10 MED ORDER — BUSPIRONE HCL 5 MG PO TABS: 5.0000 mg | ORAL_TABLET | Freq: Three times a day (TID) | ORAL | Status: DC

## 2012-11-10 MED ORDER — AZITHROMYCIN 250 MG PO TABS: ORAL_TABLET | ORAL | Status: DC
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
## 2013-06-11 ENCOUNTER — Ambulatory Visit: Payer: Self-pay | Admitting: Physician Assistant

## 2013-06-11 VITALS — BP 160/90 | HR 70 | Temp 98.8°F | Resp 16 | Ht 65.0 in | Wt 142.0 lb

## 2013-06-11 DIAGNOSIS — S069X0A Unspecified intracranial injury without loss of consciousness, initial encounter: Secondary | ICD-10-CM

## 2013-06-11 DIAGNOSIS — Z87898 Personal history of other specified conditions: Secondary | ICD-10-CM | POA: Insufficient documentation

## 2013-06-11 DIAGNOSIS — S069X9S Unspecified intracranial injury with loss of consciousness of unspecified duration, sequela: Secondary | ICD-10-CM

## 2013-06-11 DIAGNOSIS — F4310 Post-traumatic stress disorder, unspecified: Secondary | ICD-10-CM

## 2013-06-11 DIAGNOSIS — S069XAA Unspecified intracranial injury with loss of consciousness status unknown, initial encounter: Secondary | ICD-10-CM | POA: Insufficient documentation

## 2013-06-11 DIAGNOSIS — S069X0S Unspecified intracranial injury without loss of consciousness, sequela: Secondary | ICD-10-CM

## 2013-06-11 DIAGNOSIS — F909 Attention-deficit hyperactivity disorder, unspecified type: Secondary | ICD-10-CM

## 2013-06-11 DIAGNOSIS — Z8669 Personal history of other diseases of the nervous system and sense organs: Secondary | ICD-10-CM

## 2013-06-11 DIAGNOSIS — S069XAS Unspecified intracranial injury with loss of consciousness status unknown, sequela: Secondary | ICD-10-CM

## 2013-06-11 DIAGNOSIS — J069 Acute upper respiratory infection, unspecified: Secondary | ICD-10-CM

## 2013-06-11 DIAGNOSIS — S069X9A Unspecified intracranial injury with loss of consciousness of unspecified duration, initial encounter: Secondary | ICD-10-CM

## 2013-06-11 MED ORDER — TRAMADOL HCL 50 MG PO TABS: 50.0000 mg | ORAL_TABLET | Freq: Three times a day (TID) | ORAL | Status: DC | PRN

## 2013-06-11 NOTE — Patient Instructions (Signed)
Get plenty of rest and drink at least 64 ounces of water daily. Schedule a follow-up with Dr. Cleta Alberts at your convenience.

## 2013-06-11 NOTE — Progress Notes (Signed)
Subjective:    Patient ID: Joy Barber, female    DOB: 1960-10-20, 52 y.o.   MRN: [id number]  HPI Presents needing a couple of notes. Missed work and class all week due to illness.  Lots of congestion, drainage, laryngitis, achiness.  Has used homeopathic products along with Airborne, vitamin C, OTC cold/allergy product (acetaminophen 325 mg, Guaifenesin 200 mg, phenylephrine 5 mg) and cetirizine. Doesn't want any prescription medications. Needs a note for work and another for class.  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 The note Dr. Cleta Alberts wrote her doesn't specifically state that she needs to test in a location with fewer distractions, and she needs something to address that.  Father has DMT2 and she's concerned that she has frequent urination. Doesn't want to provide a urine or blood sample today to address that.  Having hot flashes, "I'm like a woman on Viagra." Irritable. Sleeping is a big problem.  Uses valerian root. Not interested in any prescription product for treatment of these.  Review of Systems As above.  No CP, SOB, dizziness, GI symptoms.    Objective:   Physical Exam  Vitals reviewed. Constitutional: She is oriented to person, place, and time. Vital signs are normal. She appears well-developed and well-nourished. She is active and cooperative. No distress.  HENT:  Head: Normocephalic and atraumatic.  Right Ear: Hearing, tympanic membrane, external ear and ear canal normal.  Left Ear: Hearing, tympanic membrane, external ear and ear canal normal.  Nose: Mucosal edema present. No rhinorrhea.  No foreign bodies. Right sinus exhibits no maxillary sinus tenderness and no frontal sinus tenderness. Left sinus exhibits no maxillary sinus tenderness and no frontal sinus tenderness.  Mouth/Throat: Uvula is midline, oropharynx is clear and moist and mucous membranes are normal. No edematous. No oropharyngeal  exudate.  Eyes: Conjunctivae, EOM and lids are normal. Pupils are equal, round, and reactive to light. Right eye exhibits no discharge. Left eye exhibits no discharge. No scleral icterus.  Neck: Trachea normal, normal range of motion and full passive range of motion without pain. Neck supple. No mass and no thyromegaly present.  Cardiovascular: Normal rate, regular rhythm and normal heart sounds.   Pulmonary/Chest: Effort normal and breath sounds normal.  Lymphadenopathy:       Head (right side): No submandibular, no tonsillar, no preauricular, no posterior auricular and no occipital adenopathy present.       Head (left side): No submandibular, no tonsillar, no preauricular and no occipital adenopathy present.    She has no cervical adenopathy.       Right: No supraclavicular adenopathy present.       Left: No supraclavicular adenopathy present.  Neurological: She is alert and oriented to person, place, and time. She has normal strength. No sensory deficit.  Skin: Skin is warm, dry and intact. No rash noted.  Psychiatric: She has a normal mood and affect. Her speech is normal and behavior is normal.  Quite verbose.       Assessment & Plan:  ADHD (attention deficit hyperactivity disorder) Letter written to supplement Dr. Ellis Parents reagarding the need for testing in a room with no distractions.  TBI (traumatic brain injury), sequela - Plan: REFILL traMADol (ULTRAM) 50 MG tablet  Viral URI She'll continue her current OTC and homeopathic products since she's improving.  Letters   She will follow-up with Dr. Cleta Alberts regarding the increased urinary frequency.  Fernande Bras,  PA-C Physician Assistant-Certified [facility]

## 2013-07-13 ENCOUNTER — Ambulatory Visit: Payer: Self-pay | Admitting: Emergency Medicine

## 2014-12-15 ENCOUNTER — Emergency Department (HOSPITAL_COMMUNITY)
Admission: EM | Admit: 2014-12-15 | Discharge: 2014-12-15 | Disposition: A | Discharge status: Home / Self Care | Payer: Self-pay | Attending: Emergency Medicine | Admitting: Emergency Medicine

## 2014-12-15 ENCOUNTER — Encounter (HOSPITAL_COMMUNITY): Payer: Self-pay

## 2014-12-15 ENCOUNTER — Emergency Department (HOSPITAL_COMMUNITY): Payer: Self-pay

## 2014-12-15 DIAGNOSIS — J0100 Acute maxillary sinusitis, unspecified: Secondary | ICD-10-CM | POA: Insufficient documentation

## 2014-12-15 DIAGNOSIS — Z87828 Personal history of other (healed) physical injury and trauma: Secondary | ICD-10-CM | POA: Insufficient documentation

## 2014-12-15 DIAGNOSIS — Z8679 Personal history of other diseases of the circulatory system: Secondary | ICD-10-CM | POA: Insufficient documentation

## 2014-12-15 DIAGNOSIS — Z87820 Personal history of traumatic brain injury: Secondary | ICD-10-CM | POA: Insufficient documentation

## 2014-12-15 DIAGNOSIS — R05 Cough: Secondary | ICD-10-CM | POA: Insufficient documentation

## 2014-12-15 DIAGNOSIS — R509 Fever, unspecified: Secondary | ICD-10-CM

## 2014-12-15 DIAGNOSIS — Z880 Allergy status to penicillin: Secondary | ICD-10-CM | POA: Insufficient documentation

## 2014-12-15 DIAGNOSIS — R5383 Other fatigue: Secondary | ICD-10-CM | POA: Insufficient documentation

## 2014-12-15 DIAGNOSIS — Z8669 Personal history of other diseases of the nervous system and sense organs: Secondary | ICD-10-CM | POA: Insufficient documentation

## 2014-12-15 DIAGNOSIS — R61 Generalized hyperhidrosis: Secondary | ICD-10-CM | POA: Insufficient documentation

## 2014-12-15 DIAGNOSIS — R059 Cough, unspecified: Secondary | ICD-10-CM

## 2014-12-15 DIAGNOSIS — Z79899 Other long term (current) drug therapy: Secondary | ICD-10-CM | POA: Insufficient documentation

## 2014-12-15 DIAGNOSIS — Z8659 Personal history of other mental and behavioral disorders: Secondary | ICD-10-CM | POA: Insufficient documentation

## 2014-12-15 DIAGNOSIS — Z720 Tobacco use: Secondary | ICD-10-CM | POA: Insufficient documentation

## 2014-12-15 MED ORDER — SULFAMETHOXAZOLE-TRIMETHOPRIM 800-160 MG PO TABS: 1.0000 | ORAL_TABLET | Freq: Two times a day (BID) | ORAL | Status: DC

## 2014-12-15 NOTE — ED Notes (Signed)
Patient reports feeling extremely weak since Sunday.  Complains of sore throat, slight headache.

## 2014-12-15 NOTE — ED Provider Notes (Signed)
CSN: [id number]     Arrival date & time 12/15/14  1951 History  This chart was scribed for non-physician practitioner, Alvina Chou, PA-C working with Virgel Manifold, MD by Tula Nakayama, ED scribe. This patient was seen in room WTR7/WTR7 and the patient's care was started at 8:30 PM  Chief Complaint  Patient presents with  . Fatigue   The history is provided by the patient. No language interpreter was used.    HPI Comments: Joy Barber is a 54 y.o. female who presents to the Emergency Department complaining of constant, moderate generalized fatigue that started 4 days ago. She states diaphoresis, dry cough, sinus congestion and HA as associated symptoms. Pt also reports elevated BP and pulse. She denies abdominal pain, nausea, vomiting, diarrhea and fever as associated symptoms.  Past Medical History  Diagnosis Date  . Head injury   . Subdural hematoma sept 2012  . ADHD (attention deficit hyperactivity disorder)   . OCD (obsessive compulsive disorder)   . PTSD (post-traumatic stress disorder)   . Seizures     s/p TBI  . Allergy    Past Surgical History  Procedure Laterality Date  . Brain surgery  post concusive syndrome, tramatic brain injury, seizures, subdural hematoma,  . Abdominal hysterectomy    . Cosmetic surgery  1975    forehead   Family History  Problem Relation Age of Onset  . Cancer Mother     breast  . Diabetes Father   . Cancer Father     testicular  . Hypertension Brother   . Heart disease Brother    History  Substance Use Topics  . Smoking status: Current Some Day Smoker -- 0.20 packs/day    Types: Cigarettes  . Smokeless tobacco: Never Used  . Alcohol Use: No   OB History    No data available     Review of Systems  Constitutional: Positive for diaphoresis and fatigue.  HENT: Positive for congestion, sinus pressure and sore throat.   Respiratory: Positive for cough.   Gastrointestinal: Negative for nausea, vomiting, abdominal pain and  diarrhea.  Neurological: Positive for headaches.   Allergies  Nsaids and Penicillins  Home Medications   Prior to Admission medications   Medication Sig Start Date End Date Taking? Authorizing Provider  acetaminophen (TYLENOL) 500 MG tablet Take 1,000 mg by mouth every 6 (six) hours as needed. For pain.    Historical Provider, MD  busPIRone (BUSPAR) 5 MG tablet Take 1 tablet (5 mg total) by mouth 3 (three) times daily. 11/10/12   Thao P Le, DO  Dextromethorphan HBr 15 MG CAPS Take 15 mg by mouth daily.    Historical Provider, MD  Ginger, Zingiber officinalis, (GINGER ROOT PO) Take 1,100 mg by mouth 2 (two) times daily.    Historical Provider, MD  Melatonin 200 MCG TABS Take 200 mcg by mouth at bedtime.    Historical Provider, MD  NON FORMULARY Take 2,000 mg by mouth daily. FIBRENZA 1000 mg    Historical Provider, MD  OVER THE COUNTER MEDICATION Take 800 mg by mouth 2 (two) times daily. PIRACETAM 800 mg    Historical Provider, MD  OVER THE COUNTER MEDICATION Take 200 mg by mouth 2 (two) times daily. GABA    Historical Provider, MD  OVER THE COUNTER MEDICATION Take 1,000 mg by mouth daily. MSM    Historical Provider, MD  OVER THE COUNTER MEDICATION Take 160 mg by mouth 3 (three) times daily. HOT FLASH    Historical Provider, MD  OVER THE COUNTER MEDICATION Take 1 tablet by mouth 2 (two) times daily. VITAMIN B + ZINC    Historical Provider, MD  Pediatric Multivit-Minerals-C (GUMMI BEAR MULTIVITAMIN/MIN) CHEW Chew 1 tablet by mouth daily.    Historical Provider, MD  traMADol (ULTRAM) 50 MG tablet Take 1 tablet (50 mg total) by mouth every 8 (eight) hours as needed for pain. 06/11/13   Chelle S Jeffery, PA-C  TURMERIC PO Take 1 tablet by mouth daily.    Historical Provider, MD  VALERIAN ROOT PO Take 75 mg by mouth at bedtime.    Historical Provider, MD  vitamin C (ASCORBIC ACID) 500 MG tablet Take 500 mg by mouth daily.    Historical Provider, MD   BP 147/95 mmHg  Pulse 95  Temp(Src) 98.5 F  (36.9 C) (Oral)  Resp 18  Ht 5\' 4"  (1.626 m)  Wt 145 lb (65.772 kg)  BMI 24.88 kg/m2  SpO2 98% Physical Exam  Constitutional: She is oriented to person, place, and time. She appears well-developed and well-nourished. No distress.  HENT:  Head: Normocephalic and atraumatic.  Mouth/Throat: Oropharynx is clear and moist. No oropharyngeal exudate.  Cobble-stoning of the posterior oropharynx. Maxillary sinus tenderness to palpation.   Eyes: Conjunctivae and EOM are normal. Pupils are equal, round, and reactive to light.  Neck: Neck supple. No tracheal deviation present.  Cardiovascular: Normal rate, regular rhythm and normal heart sounds.  Exam reveals no gallop and no friction rub.   No murmur heard. Pulmonary/Chest: Effort normal and breath sounds normal. No respiratory distress. She has no wheezes. She has no rales. She exhibits no tenderness.  Abdominal: Soft. There is no tenderness.  Musculoskeletal: Normal range of motion.  Neurological: She is alert and oriented to person, place, and time.  Speech is goal-oriented. Moves limbs without ataxia.   Skin: Skin is warm and dry.  Psychiatric: She has a normal mood and affect. Her behavior is normal.  Nursing note and vitals reviewed.  ED Course  Procedures   DIAGNOSTIC STUDIES: Oxygen Saturation is 98% on RA, normal by my interpretation.    COORDINATION OF CARE: 8:34 PM Discussed treatment plan with pt which includes a chest x-ray. Pt agreed to plan.  Labs Review Labs Reviewed - No data to display  Imaging Review Dg Chest 2 View  12/15/2014   CLINICAL DATA:  Fatigue and dry cough  EXAM: CHEST  2 VIEW  COMPARISON:  07/07/2012  FINDINGS: The heart size and mediastinal contours are within normal limits. Both lungs are clear. The visualized skeletal structures are unremarkable.  IMPRESSION: No active cardiopulmonary disease.   Electronically Signed   By: Inez Catalina M.D.   On: 12/15/2014 20:53     EKG Interpretation None       MDM   Final diagnoses:  Cough  Fever  Acute maxillary sinusitis, recurrence not specified    9:17 PM Chest xray unremarkable for acute changes. Patient will be treated for sinusitis with bactrim. Vitals stable and patient afebrile.   I personally performed the services described in this documentation, which was scribed in my presence. The recorded information has been reviewed and is accurate.     Alvina Chou, PA-C 12/15/14 2118  Virgel Manifold, MD 12/17/14 [phone]

## 2014-12-15 NOTE — Discharge Instructions (Signed)
Take bactrim as directed until gone. Refer to attached documents for more information. Return to the ED with worsening or concerning symptoms.

## 2015-04-20 ENCOUNTER — Emergency Department (HOSPITAL_COMMUNITY)
Admission: EM | Admit: 2015-04-20 | Discharge: 2015-04-20 | Disposition: A | Discharge status: Home / Self Care | Payer: Self-pay | Attending: Emergency Medicine | Admitting: Emergency Medicine

## 2015-04-20 ENCOUNTER — Encounter (HOSPITAL_COMMUNITY): Payer: Self-pay | Admitting: Emergency Medicine

## 2015-04-20 DIAGNOSIS — Z8659 Personal history of other mental and behavioral disorders: Secondary | ICD-10-CM | POA: Insufficient documentation

## 2015-04-20 DIAGNOSIS — Z880 Allergy status to penicillin: Secondary | ICD-10-CM | POA: Insufficient documentation

## 2015-04-20 DIAGNOSIS — Z87828 Personal history of other (healed) physical injury and trauma: Secondary | ICD-10-CM | POA: Insufficient documentation

## 2015-04-20 DIAGNOSIS — Z79899 Other long term (current) drug therapy: Secondary | ICD-10-CM | POA: Insufficient documentation

## 2015-04-20 DIAGNOSIS — L259 Unspecified contact dermatitis, unspecified cause: Secondary | ICD-10-CM | POA: Insufficient documentation

## 2015-04-20 DIAGNOSIS — Z87891 Personal history of nicotine dependence: Secondary | ICD-10-CM | POA: Insufficient documentation

## 2015-04-20 MED ORDER — PREDNISONE 20 MG PO TABS: 20.0000 mg | ORAL_TABLET | Freq: Every day | ORAL | Status: DC

## 2015-04-20 MED ORDER — DEXAMETHASONE SODIUM PHOSPHATE 10 MG/ML IJ SOLN
10.0000 mg | Freq: Once | INTRAMUSCULAR | Status: AC
  Administered 2015-04-20: 10 mg via INTRAMUSCULAR
  Filled 2015-04-20: qty 1

## 2015-04-20 NOTE — ED Notes (Signed)
Red raised blisters and rash on whole body. Increased swelling on l/forearm.  Pt completed yard work on Monday, skin eruptions stated on Tuesday. Pt treated skin with OTC meds, Benadryl and topical Cortizone.

## 2015-04-20 NOTE — ED Provider Notes (Signed)
CSN: [id number]     Arrival date & time 04/20/15  1042 History   First MD Initiated Contact with Patient 04/20/15 1055     Chief Complaint  Patient presents with  . Rash    rash x 2 days  . Allergic Reaction  . Insect Bite    insect bite to l/forearm     (Consider location/radiation/quality/duration/timing/severity/associated sxs/prior Treatment) HPI Comments: Patient presents to the emergency department with chief complaint of rash. She states that the rash started on Tuesday. She states that she was working in the yard on Monday. She is uncertain whether or not she came in contact with any poison ivy or poison oak. She has tried using Aleve, Benadryl, topical cortisone with no relief. The rash is mostly located on her distal upper and lower extremities. She denies any fevers, or chills. Denies difficulty breathing. She states that the rash is somewhat painful. The symptoms are aggravated with palpation and when the "wind blows on the rash."  The history is provided by the patient. No language interpreter was used.    Past Medical History  Diagnosis Date  . Head injury   . Subdural hematoma sept 2012  . ADHD (attention deficit hyperactivity disorder)   . OCD (obsessive compulsive disorder)   . PTSD (post-traumatic stress disorder)   . Seizures     s/p TBI  . Allergy    Past Surgical History  Procedure Laterality Date  . Brain surgery  post concusive syndrome, tramatic brain injury, seizures, subdural hematoma,  . Abdominal hysterectomy    . Cosmetic surgery  1975    forehead   Family History  Problem Relation Age of Onset  . Cancer Mother     breast  . Diabetes Father   . Cancer Father     testicular  . Hypertension Brother   . Heart disease Brother    History  Substance Use Topics  . Smoking status: Former Smoker -- 0.20 packs/day    Types: Cigarettes    Quit date: 03/21/2015  . Smokeless tobacco: Former Systems developer  . Alcohol Use: No   OB History    No data  available     Review of Systems  Constitutional: Negative for fever and chills.  Respiratory: Negative for shortness of breath.   Cardiovascular: Negative for chest pain.  Gastrointestinal: Negative for nausea, vomiting, diarrhea and constipation.  Genitourinary: Negative for dysuria.  Skin: Positive for rash.      Allergies  Nsaids and Penicillins  Home Medications   Prior to Admission medications   Medication Sig Start Date End Date Taking? Authorizing Provider  acetaminophen (TYLENOL) 500 MG tablet Take 1,000 mg by mouth every 6 (six) hours as needed. For pain.    Historical Provider, MD  busPIRone (BUSPAR) 5 MG tablet Take 1 tablet (5 mg total) by mouth 3 (three) times daily. 11/10/12   Thao P Le, DO  Dextromethorphan HBr 15 MG CAPS Take 15 mg by mouth daily.    Historical Provider, MD  Ginger, Zingiber officinalis, (GINGER ROOT PO) Take 1,100 mg by mouth 2 (two) times daily.    Historical Provider, MD  Melatonin 200 MCG TABS Take 200 mcg by mouth at bedtime.    Historical Provider, MD  NON FORMULARY Take 2,000 mg by mouth daily. FIBRENZA 1000 mg    Historical Provider, MD  OVER THE COUNTER MEDICATION Take 800 mg by mouth 2 (two) times daily. PIRACETAM 800 mg    Historical Provider, MD  OVER THE COUNTER  MEDICATION Take 200 mg by mouth 2 (two) times daily. GABA    Historical Provider, MD  OVER THE COUNTER MEDICATION Take 1,000 mg by mouth daily. MSM    Historical Provider, MD  OVER THE COUNTER MEDICATION Take 160 mg by mouth 3 (three) times daily. HOT FLASH    Historical Provider, MD  OVER THE COUNTER MEDICATION Take 1 tablet by mouth 2 (two) times daily. VITAMIN B + ZINC    Historical Provider, MD  Pediatric Multivit-Minerals-C (GUMMI BEAR MULTIVITAMIN/MIN) CHEW Chew 1 tablet by mouth daily.    Historical Provider, MD  sulfamethoxazole-trimethoprim (SEPTRA DS) 800-160 MG per tablet Take 1 tablet by mouth 2 (two) times daily. 12/15/14   Kaitlyn Szekalski, PA-C  traMADol (ULTRAM) 50  MG tablet Take 1 tablet (50 mg total) by mouth every 8 (eight) hours as needed for pain. 06/11/13   Chelle Jeffery, PA-C  TURMERIC PO Take 1 tablet by mouth daily.    Historical Provider, MD  VALERIAN ROOT PO Take 75 mg by mouth at bedtime.    Historical Provider, MD  vitamin C (ASCORBIC ACID) 500 MG tablet Take 500 mg by mouth daily.    Historical Provider, MD   BP 165/99 mmHg  Pulse 87  Temp(Src) 98.1 F (36.7 C) (Oral)  Resp 18  Wt 145 lb (65.772 kg)  SpO2 100% Physical Exam  Constitutional: She is oriented to person, place, and time. She appears well-developed and well-nourished.  HENT:  Head: Normocephalic and atraumatic.  Eyes: Conjunctivae and EOM are normal.  Neck: Normal range of motion.  Cardiovascular: Normal rate, regular rhythm and normal heart sounds.  Exam reveals no gallop and no friction rub.   No murmur heard. Pulmonary/Chest: Effort normal and breath sounds normal. No respiratory distress. She has no wheezes. She exhibits no tenderness.  Abdominal: She exhibits no distension.  Musculoskeletal: Normal range of motion.  Neurological: She is alert and oriented to person, place, and time.  Skin: Skin is dry.  Scattered vesicular lesions and some linear vesicular lesions characteristic of poison ivy  Psychiatric: She has a normal mood and affect. Her behavior is normal. Judgment and thought content normal.  Nursing note and vitals reviewed.   ED Course  Procedures (including critical care time) Labs Review Labs Reviewed - No data to display  Imaging Review No results found.   EKG Interpretation None      MDM   Final diagnoses:  Contact dermatitis    Patient with contact dermatitis.  Likely secondary to poison ivy.  Will treat with IM decadron and give 21 day prednisone taper.  Return precautions discussed.    Montine Circle, PA-C 04/20/15 1148  Gareth Morgan, MD 04/20/15 2316

## 2015-04-20 NOTE — Discharge Instructions (Signed)
Poison [employer] ivy is a inflammation of the skin (contact dermatitis) caused by touching the allergens on the leaves of the ivy plant following previous exposure to the plant. The rash usually appears 48 hours after exposure. The rash is usually bumps (papules) or blisters (vesicles) in a linear pattern. Depending on your own sensitivity, the rash may simply cause redness and itching, or it may also progress to blisters which may break open. These must be well cared for to prevent secondary bacterial (germ) infection, followed by scarring. Keep any open areas dry, clean, dressed, and covered with an antibacterial ointment if needed. The eyes may also get puffy. The puffiness is worst in the morning and gets better as the day progresses. This dermatitis usually heals without scarring, within 2 to 3 weeks without treatment. HOME CARE INSTRUCTIONS  Thoroughly wash with soap and water as soon as you have been exposed to poison ivy. You have about one half hour to remove the plant resin before it will cause the rash. This washing will destroy the oil or antigen on the skin that is causing, or will cause, the rash. Be sure to wash under your fingernails as any plant resin there will continue to spread the rash. Do not rub skin vigorously when washing affected area. Poison ivy cannot spread if no oil from the plant remains on your body. A rash that has progressed to weeping sores will not spread the rash unless you have not washed thoroughly. It is also important to wash any clothes you have been wearing as these may carry active allergens. The rash will return if you wear the unwashed clothing, even several days later. Avoidance of the plant in the future is the best measure. Poison ivy plant can be recognized by the number of leaves. Generally, poison ivy has three leaves with flowering branches on a single stem. Diphenhydramine may be purchased over the counter and used as needed for itching. Do not drive with  this medication if it makes you drowsy.Ask your caregiver about medication for children. SEEK MEDICAL CARE IF:  Open sores develop.  Redness spreads beyond area of rash.  You notice purulent (pus-like) discharge.  You have increased pain.  Other signs of infection develop (such as fever). Document Released: 09/06/2000 Document Revised: 12/02/2011 Document Reviewed: 02/17/2009 [facility] Patient Information 2015 [place], [place]. This information is not intended to replace advice given to you by your health care provider. Make sure you discuss any questions you have with your health care provider. Contact Dermatitis Contact dermatitis is a reaction to certain substances that touch the skin. Contact dermatitis can be either irritant contact dermatitis or allergic contact dermatitis. Irritant contact dermatitis does not require previous exposure to the substance for a reaction to occur.Allergic contact dermatitis only occurs if you have been exposed to the substance before. Upon a repeat exposure, your body reacts to the substance.  CAUSES  Many substances can cause contact dermatitis. Irritant dermatitis is most commonly caused by repeated exposure to mildly irritating substances, such as:  Makeup.  Soaps.  Detergents.  Bleaches.  Acids.  Metal salts, such as nickel. Allergic contact dermatitis is most commonly caused by exposure to:  Poisonous plants.  Chemicals (deodorants, shampoos).  Jewelry.  Latex.  Neomycin in triple antibiotic cream.  Preservatives in products, including clothing. SYMPTOMS  The area of skin that is exposed may develop:  Dryness or flaking.  Redness.  Cracks.  Itching.  Pain or a burning sensation.  Blisters. With allergic  contact dermatitis, there may also be swelling in areas such as the eyelids, mouth, or genitals.  DIAGNOSIS  Your caregiver can usually tell what the problem is by doing a physical exam. In cases where the cause is  uncertain and an allergic contact dermatitis is suspected, a patch skin test may be performed to help determine the cause of your dermatitis. TREATMENT Treatment includes protecting the skin from further contact with the irritating substance by avoiding that substance if possible. Barrier creams, powders, and gloves may be helpful. Your caregiver may also recommend:  Steroid creams or ointments applied 2 times daily. For best results, soak the rash area in cool water for 20 minutes. Then apply the medicine. Cover the area with a plastic wrap. You can store the steroid cream in the refrigerator for a "chilly" effect on your rash. That may decrease itching. Oral steroid medicines may be needed in more severe cases.  Antibiotics or antibacterial ointments if a skin infection is present.  Antihistamine lotion or an antihistamine taken by mouth to ease itching.  Lubricants to keep moisture in your skin.  Burow's solution to reduce redness and soreness or to dry a weeping rash. Mix one packet or tablet of solution in 2 cups cool water. Dip a clean washcloth in the mixture, wring it out a bit, and put it on the affected area. Leave the cloth in place for 30 minutes. Do this as often as possible throughout the day.  Taking several cornstarch or baking soda baths daily if the area is too large to cover with a washcloth. Harsh chemicals, such as alkalis or acids, can cause skin damage that is like a burn. You should flush your skin for 15 to 20 minutes with cold water after such an exposure. You should also seek immediate medical care after exposure. Bandages (dressings), antibiotics, and pain medicine may be needed for severely irritated skin.  HOME CARE INSTRUCTIONS  Avoid the substance that caused your reaction.  Keep the area of skin that is affected away from hot water, soap, sunlight, chemicals, acidic substances, or anything else that would irritate your skin.  Do not scratch the rash. Scratching  may cause the rash to become infected.  You may take cool baths to help stop the itching.  Only take over-the-counter or prescription medicines as directed by your caregiver.  See your caregiver for follow-up care as directed to make sure your skin is healing properly. SEEK MEDICAL CARE IF:   Your condition is not better after 3 days of treatment.  You seem to be getting worse.  You see signs of infection such as swelling, tenderness, redness, soreness, or warmth in the affected area.  You have any problems related to your medicines. Document Released: 09/06/2000 Document Revised: 12/02/2011 Document Reviewed: 02/12/2011 [facility] Patient Information 2015 [place], [place]. This information is not intended to replace advice given to you by your health care provider. Make sure you discuss any questions you have with your health care provider.

## 2015-08-08 ENCOUNTER — Ambulatory Visit: Payer: Self-pay

## 2016-05-14 DIAGNOSIS — Z0271 Encounter for disability determination: Secondary | ICD-10-CM

## 2016-07-02 ENCOUNTER — Emergency Department (HOSPITAL_COMMUNITY)
Admission: EM | Admit: 2016-07-02 | Discharge: 2016-07-02 | Disposition: A | Discharge status: Home / Self Care | Payer: Self-pay | Attending: Emergency Medicine | Admitting: Emergency Medicine

## 2016-07-02 ENCOUNTER — Encounter (HOSPITAL_COMMUNITY): Payer: Self-pay | Admitting: Emergency Medicine

## 2016-07-02 ENCOUNTER — Emergency Department (HOSPITAL_COMMUNITY): Payer: Self-pay

## 2016-07-02 DIAGNOSIS — Y999 Unspecified external cause status: Secondary | ICD-10-CM | POA: Insufficient documentation

## 2016-07-02 DIAGNOSIS — F909 Attention-deficit hyperactivity disorder, unspecified type: Secondary | ICD-10-CM | POA: Insufficient documentation

## 2016-07-02 DIAGNOSIS — X58XXXA Exposure to other specified factors, initial encounter: Secondary | ICD-10-CM | POA: Insufficient documentation

## 2016-07-02 DIAGNOSIS — Z79899 Other long term (current) drug therapy: Secondary | ICD-10-CM | POA: Insufficient documentation

## 2016-07-02 DIAGNOSIS — F17210 Nicotine dependence, cigarettes, uncomplicated: Secondary | ICD-10-CM | POA: Insufficient documentation

## 2016-07-02 DIAGNOSIS — Y939 Activity, unspecified: Secondary | ICD-10-CM | POA: Insufficient documentation

## 2016-07-02 DIAGNOSIS — S90851A Superficial foreign body, right foot, initial encounter: Secondary | ICD-10-CM | POA: Insufficient documentation

## 2016-07-02 DIAGNOSIS — Y929 Unspecified place or not applicable: Secondary | ICD-10-CM | POA: Insufficient documentation

## 2016-07-02 MED ORDER — LIDOCAINE HCL (PF) 1 % IJ SOLN
2.0000 mL | Freq: Once | INTRAMUSCULAR | Status: DC
  Filled 2016-07-02: qty 30

## 2016-07-02 MED ORDER — SALICYLIC ACID 6 % EX GEL: CUTANEOUS | 0 refills | Status: DC

## 2016-07-02 MED ORDER — TERBINAFINE HCL 250 MG PO TABS: 250.0000 mg | ORAL_TABLET | Freq: Every day | ORAL | 0 refills | Status: DC

## 2016-07-02 MED ORDER — SALICYLIC ACID 17 % EX GEL: Freq: Every day | CUTANEOUS | 0 refills | Status: DC

## 2016-07-02 NOTE — Discharge Instructions (Signed)
Medications: Terbinafine, salicylic acid  Treatment: Apply salicylic acid directly to the area on your foot every 2 days and cover with duct tape. Replace application and tape every 48 hours. Take terbinafine once daily. You will be given 1 month supply here, however he will need to follow-up with [place] as soon as possible, as you may need longer duration of treatment  Follow-up: Please follow-up with [facility] for further evaluation and treatment of your symptoms. Please return to emergency department if you develop any new or worsening symptoms.

## 2016-07-02 NOTE — ED Provider Notes (Signed)
[facility] DEPT Provider Note   CSN: [id number] Arrival date & time: 07/02/16  1145  By signing my name below, I, Neta Mends, attest that this documentation has been prepared under the direction and in the presence of Eliezer Mccoy, PA-C. Electronically Signed: Neta Mends, ED Scribe. 07/02/2016. 12:19 PM.   History   Chief Complaint Chief Complaint  Patient presents with  . Foot Problem    The history is provided by the patient. No language interpreter was used.  HPI Comments:  Joy Barber is a 55 y.o. female who presents to the Emergency Department with multiple complaints. Pt states that she stepped on something with her right foot 1-2 days ago. Pt is unsure of what it was and has been hesitant to dislodge the object because her vision is poor. Pt states that she has difficulty walking on her right foot. Pt notes a toenail fungus on her right foot that has been present for 1 year. Pt also complains of an area on the bottom of her left foot that has been present for over 1 year. Pt believes this area to be a wart. She has tried many over-the-counter solutions without relief. Pt denies other associated complaints.    Past Medical History:  Diagnosis Date  . ADHD (attention deficit hyperactivity disorder)   . Allergy   . Head injury   . OCD (obsessive compulsive disorder)   . PTSD (post-traumatic stress disorder)   . Seizures ([place])    s/p TBI  . Subdural hematoma ([place]) sept 2012    Patient Active Problem List   Diagnosis Date Noted  . ADHD (attention deficit hyperactivity disorder) 06/11/2013  . PTSD (post-traumatic stress disorder) 06/11/2013  . History of seizures 06/11/2013  . TBI (traumatic brain injury) ([place]) 06/11/2013    Past Surgical History:  Procedure Laterality Date  . ABDOMINAL HYSTERECTOMY    . BRAIN SURGERY  post concusive syndrome, tramatic brain injury, seizures, subdural hematoma,  . COSMETIC SURGERY  1975   forehead    OB  History    No data available       Home Medications    Prior to Admission medications   Medication Sig Start Date End Date Taking? Authorizing Provider  acetaminophen (TYLENOL) 500 MG tablet Take 1,000 mg by mouth every 6 (six) hours as needed. For pain.    Historical Provider, MD  busPIRone (BUSPAR) 5 MG tablet Take 1 tablet (5 mg total) by mouth 3 (three) times daily. 11/10/12   Thao P Le, DO  Dextromethorphan HBr 15 MG CAPS Take 15 mg by mouth daily.    Historical Provider, MD  Ginger, Zingiber officinalis, (GINGER ROOT PO) Take 1,100 mg by mouth 2 (two) times daily.    Historical Provider, MD  Melatonin 200 MCG TABS Take 200 mcg by mouth at bedtime.    Historical Provider, MD  NON FORMULARY Take 2,000 mg by mouth daily. FIBRENZA 1000 mg    Historical Provider, MD  OVER THE COUNTER MEDICATION Take 800 mg by mouth 2 (two) times daily. PIRACETAM 800 mg    Historical Provider, MD  OVER THE COUNTER MEDICATION Take 200 mg by mouth 2 (two) times daily. GABA    Historical Provider, MD  OVER THE COUNTER MEDICATION Take 1,000 mg by mouth daily. MSM    Historical Provider, MD  OVER THE COUNTER MEDICATION Take 160 mg by mouth 3 (three) times daily. HOT FLASH    Historical Provider, MD  OVER THE COUNTER MEDICATION Take 1  tablet by mouth 2 (two) times daily. VITAMIN B + ZINC    Historical Provider, MD  Pediatric Multivit-Minerals-C (GUMMI BEAR MULTIVITAMIN/MIN) CHEW Chew 1 tablet by mouth daily.    Historical Provider, MD  predniSONE (DELTASONE) 20 MG tablet Take 1 tablet (20 mg total) by mouth daily. Take 3 tablets (60 mg) daily for 7 days, then take 2 tablets (40 mg) daily for 7 days, then take 1 tablet (20 mg) daily for 7 days. 04/20/15   Montine Circle, PA-C  salicylic acid 17 % gel Apply topically daily. 07/02/16   Isabel Freese M Shadi Larner, PA-C  sulfamethoxazole-trimethoprim (SEPTRA DS) 800-160 MG per tablet Take 1 tablet by mouth 2 (two) times daily. 12/15/14   Kaitlyn Szekalski, PA-C  terbinafine  (LAMISIL) 250 MG tablet Take 1 tablet (250 mg total) by mouth daily. 07/02/16   Frederica Kuster, PA-C  traMADol (ULTRAM) 50 MG tablet Take 1 tablet (50 mg total) by mouth every 8 (eight) hours as needed for pain. 06/11/13   Chelle Jeffery, PA-C  TURMERIC PO Take 1 tablet by mouth daily.    Historical Provider, MD  VALERIAN ROOT PO Take 75 mg by mouth at bedtime.    Historical Provider, MD  vitamin C (ASCORBIC ACID) 500 MG tablet Take 500 mg by mouth daily.    Historical Provider, MD    Family History Family History  Problem Relation Age of Onset  . Cancer Mother     breast  . Diabetes Father   . Cancer Father     testicular  . Hypertension Brother   . Heart disease Brother     Social History Social History  Substance Use Topics  . Smoking status: Current Some Day Smoker    Packs/day: 0.20    Types: Cigarettes    Last attempt to quit: 03/21/2015  . Smokeless tobacco: Former Systems developer  . Alcohol use Yes     Comment: Occasionally     Allergies   Nsaids and Penicillins   Review of Systems Review of Systems  Constitutional: Negative for chills and fever.  HENT: Negative for facial swelling.   Respiratory: Negative for shortness of breath.   Gastrointestinal: Negative for abdominal pain, nausea and vomiting.  Genitourinary: Negative for dysuria.  Musculoskeletal: Positive for gait problem (due to FB pain and callous/wart).  Skin: Positive for wound.     Physical Exam Updated Vital Signs BP (!) 140/110 (BP Location: Left Arm)   Pulse 65   Temp 98.4 F (36.9 C) (Oral)   Resp 18   Ht 5\' 4"  (1.626 m)   Wt 65.8 kg   SpO2 100%   BMI 24.89 kg/m   Physical Exam  Constitutional: She appears well-developed and well-nourished. No distress.  HENT:  Head: Normocephalic and atraumatic.  Mouth/Throat: Oropharynx is clear and moist. No oropharyngeal exudate.  Eyes: Conjunctivae are normal. Pupils are equal, round, and reactive to light. Right eye exhibits no discharge. Left eye  exhibits no discharge. No scleral icterus.  Neck: Normal range of motion. Neck supple. No thyromegaly present.  Cardiovascular: Normal rate, regular rhythm, normal heart sounds and intact distal pulses.  Exam reveals no gallop and no friction rub.   No murmur heard. Pulmonary/Chest: Effort normal and breath sounds normal. No stridor. No respiratory distress. She has no wheezes. She has no rales.  Abdominal: Soft. Bowel sounds are normal. She exhibits no distension. There is no tenderness. There is no rebound and no guarding.  Musculoskeletal: She exhibits no edema.  Bilateral feet: Normal  sensation, cap refill <2 secs; normal range of motion of all toes; patient ambulatory in the ED R foot: Foreign body visualized to sole of right foot; yellowing of toenails L foot: hard, calloused 2 cm area to sole, tender, no erythema or drainage, some pinpiont black areas within  Lymphadenopathy:    She has no cervical adenopathy.  Neurological: She is alert. Coordination normal.  Skin: Skin is warm and dry. No rash noted. She is not diaphoretic. No pallor.  Psychiatric: She has a normal mood and affect.  Nursing note and vitals reviewed.    ED Treatments / Results  DIAGNOSTIC STUDIES:  Oxygen Saturation is 98% on RA, normal by my interpretation.    COORDINATION OF CARE:  12:19 PM Discussed treatment plan with pt at bedside and pt agreed to plan.   Labs (all labs ordered are listed, but only abnormal results are displayed) Labs Reviewed - No data to display  EKG  EKG Interpretation None       Radiology Dg Foot Complete Right  Result Date: 07/02/2016 CLINICAL DATA:  The patient thinks a splinter has been stuck in her right foot for 2 days. EXAM: RIGHT FOOT COMPLETE - 3+ VIEW COMPARISON:  None. FINDINGS: There is no evidence of fracture or dislocation. There is no evidence of arthropathy or other focal bone abnormality. Soft tissues are unremarkable. No radiopaque foreign body is  identified. IMPRESSION: No radiopaque foreign body is seen. Please note that wood is radiolucent. Electronically Signed   By: Inge Rise M.D.   On: 07/02/2016 13:12    Procedures .Foreign Body Removal Date/Time: 07/02/2016 6:05 PM Performed by: Frederica Kuster Authorized by: Frederica Kuster  Consent: Verbal consent obtained. Consent given by: patient Patient identity confirmed: verbally with patient Intake: R foot. Anesthesia method: none.  Sedation: Patient sedated: no Patient restrained: no Patient cooperative: yes Complexity: simple 1 objects recovered. Objects recovered: small piece of glass Post-procedure assessment: foreign body removed Patient tolerance: Patient tolerated the procedure well with no immediate complications Comments: Cleaned with iodine prior, and removed very superficial piece of glass with 11 blade scalpel tip and forceps; I offered anesthesia, the patient wanted to try without at first. I was successful without needing anesthesia.   (including critical care time)  Medications Ordered in ED Medications - No data to display   Initial Impression / Assessment and Plan / ED Course  I have reviewed the triage vital signs and the nursing notes.  Pertinent labs & imaging results that were available during my care of the patient were reviewed by me and considered in my medical decision making (see chart for details).  Clinical Course    Small piece of glass recovered from sole of right foot easily without anesthesia. Right foot x-ray negative. Will treat patient's onychomycosis with Lamisil and patient's most likely wart to her left sole with salicylic acid and duct tape treatment every 48 hours. Patient to follow-up with [facility] for further evaluation and treatment. Patient advised to follow up and establish care with a primary care provider. Patient understands and agrees with plan. Patient vitals stable throughout ED course and discharged in  satisfactory condition.  Final Clinical Impressions(s) / ED Diagnoses   Final diagnoses:  Foreign body in right foot, initial encounter    New Prescriptions Discharge Medication List as of 07/02/2016  1:56 PM    START taking these medications   Details  salicylic acid 17 % gel Apply topically daily., Starting Tue 07/02/2016, Print  I personally performed the services described in this documentation, which was scribed in my presence. The recorded information has been reviewed and is accurate.     Frederica Kuster, PA-C 07/02/16 1809    Dorie Rank, MD 07/03/16 2128

## 2016-07-02 NOTE — ED Triage Notes (Signed)
Patient reports warts on the bottom of bilateral feet x 1 year.  Patient reports she thinks a splinter is stuck in the sole of her right foot x2 days.

## 2016-12-31 ENCOUNTER — Encounter: Payer: Self-pay | Admitting: Family Medicine

## 2016-12-31 ENCOUNTER — Ambulatory Visit (INDEPENDENT_AMBULATORY_CARE_PROVIDER_SITE_OTHER): Payer: No Typology Code available for payment source | Admitting: Family Medicine

## 2016-12-31 ENCOUNTER — Telehealth: Payer: Self-pay

## 2016-12-31 VITALS — BP 143/87 | HR 74 | Temp 98.7°F | Resp 16 | Ht 64.0 in | Wt 161.0 lb

## 2016-12-31 DIAGNOSIS — B070 Plantar wart: Secondary | ICD-10-CM

## 2016-12-31 DIAGNOSIS — Z87828 Personal history of other (healed) physical injury and trauma: Secondary | ICD-10-CM

## 2016-12-31 DIAGNOSIS — F411 Generalized anxiety disorder: Secondary | ICD-10-CM

## 2016-12-31 LAB — CBC WITH DIFFERENTIAL/PLATELET
BASOS ABS: 66 {cells}/uL (ref 0–200)
Basophils Relative: 1 %
EOS PCT: 2 %
Eosinophils Absolute: 132 cells/uL (ref 15–500)
HCT: 39.6 % (ref 35.0–45.0)
Hemoglobin: 12.7 g/dL (ref 11.7–15.5)
Lymphocytes Relative: 34 %
Lymphs Abs: 2244 cells/uL (ref 850–3900)
MCH: 29.2 pg (ref 27.0–33.0)
MCHC: 32.1 g/dL (ref 32.0–36.0)
MCV: 91 fL (ref 80.0–100.0)
MONOS PCT: 9 %
MPV: 9.8 fL (ref 7.5–12.5)
Monocytes Absolute: 594 cells/uL (ref 200–950)
NEUTROS ABS: 3564 {cells}/uL (ref 1500–7800)
NEUTROS PCT: 54 %
PLATELETS: 362 10*3/uL (ref 140–400)
RBC: 4.35 MIL/uL (ref 3.80–5.10)
RDW: 13.8 % (ref 11.0–15.0)
WBC: 6.6 10*3/uL (ref 3.8–10.8)

## 2016-12-31 LAB — COMPLETE METABOLIC PANEL WITH GFR
ALK PHOS: 55 U/L (ref 33–130)
ALT: 30 U/L — ABNORMAL HIGH (ref 6–29)
AST: 19 U/L (ref 10–35)
Albumin: 4.3 g/dL (ref 3.6–5.1)
BUN: 13 mg/dL (ref 7–25)
CO2: 24 mmol/L (ref 20–31)
Calcium: 8.8 mg/dL (ref 8.6–10.4)
Chloride: 106 mmol/L (ref 98–110)
Creat: 0.84 mg/dL (ref 0.50–1.05)
GFR, EST NON AFRICAN AMERICAN: 80 mL/min (ref >=60)
GFR, Est African American: >89 mL/min (ref >=60)
GLUCOSE: 93 mg/dL (ref 65–99)
POTASSIUM: 4.2 mmol/L (ref 3.5–5.3)
Sodium: 139 mmol/L (ref 135–146)
Total Bilirubin: 0.5 mg/dL (ref 0.2–1.2)
Total Protein: 6.9 g/dL (ref 6.1–8.1)

## 2016-12-31 LAB — LIPID PANEL
Cholesterol: 220 mg/dL — ABNORMAL HIGH (ref <200)
HDL: 82 mg/dL (ref >50)
LDL Cholesterol: 116 mg/dL — ABNORMAL HIGH (ref <100)
TRIGLYCERIDES: 109 mg/dL (ref <150)
Total CHOL/HDL Ratio: 2.7 Ratio (ref <5.0)
VLDL: 22 mg/dL (ref <30)

## 2016-12-31 LAB — THYROID PANEL WITH TSH
FREE THYROXINE INDEX: 2 (ref 1.4–3.8)
T3 Uptake: 29 % (ref 22–35)
T4, Total: 7 ug/dL (ref 4.5–12.0)
TSH: 2.85 m[IU]/L

## 2016-12-31 MED ORDER — BUSPIRONE HCL 5 MG PO TABS: 5.0000 mg | ORAL_TABLET | Freq: Three times a day (TID) | ORAL | 3 refills | Status: DC

## 2016-12-31 MED ORDER — GABAPENTIN 300 MG PO CAPS: 300.0000 mg | ORAL_CAPSULE | Freq: Three times a day (TID) | ORAL | 1 refills | Status: DC

## 2016-12-31 MED FILL — busPIRone HCL 5 MG TABS: 5 | 30 days supply | Qty: 90 | Fill #0

## 2016-12-31 MED FILL — GABAPENTIN 300 MG CAPSULE: 300 | 30 days supply | Qty: 90 | Fill #0

## 2016-12-31 NOTE — Patient Instructions (Signed)
I have refilled your Buspar 5 mg-Discontinue Hydroxyzine- for anxiety.  Start Gabapentin 300 mg three times daily for headache pain.   Generalized Anxiety Disorder, Adult Generalized anxiety disorder (GAD) is a mental health disorder. People with this condition constantly worry about everyday events. Unlike normal anxiety, worry related to GAD is not triggered by a specific event. These worries also do not fade or get better with time. GAD interferes with life functions, including relationships, work, and school. GAD can vary from mild to severe. People with severe GAD can have intense waves of anxiety with physical symptoms (panic attacks). What are the causes? The exact cause of GAD is not known. What increases the risk? This condition is more likely to develop in:  Women.  People who have a family history of anxiety disorders.  People who are very shy.  People who experience very stressful life events, such as the death of a loved one.  People who have a very stressful family environment. What are the signs or symptoms? People with GAD often worry excessively about many things in their lives, such as their health and family. They may also be overly concerned about:  Doing well at work.  Being on time.  Natural disasters.  Friendships. Physical symptoms of GAD include:  Fatigue.  Muscle tension or having muscle twitches.  Trembling or feeling shaky.  Being easily startled.  Feeling like your heart is pounding or racing.  Feeling out of breath or like you cannot take a deep breath.  Having trouble falling asleep or staying asleep.  Sweating.  Nausea, diarrhea, or irritable bowel syndrome (IBS).  Headaches.  Trouble concentrating or remembering facts.  Restlessness.  Irritability. How is this diagnosed? Your health care provider can diagnose GAD based on your symptoms and medical history. You will also have a physical exam. The health care provider will ask  specific questions about your symptoms, including how severe they are, when they started, and if they come and go. Your health care provider may ask you about your use of alcohol or drugs, including prescription medicines. Your health care provider may refer you to a mental health specialist for further evaluation. Your health care provider will do a thorough examination and may perform additional tests to rule out other possible causes of your symptoms. To be diagnosed with GAD, a person must have anxiety that:  Is out of his or her control.  Affects several different aspects of his or her life, such as work and relationships.  Causes distress that makes him or her unable to take part in normal activities.  Includes at least three physical symptoms of GAD, such as restlessness, fatigue, trouble concentrating, irritability, muscle tension, or sleep problems. Before your health care provider can confirm a diagnosis of GAD, these symptoms must be present more days than they are not, and they must last for six months or longer. How is this treated? The following therapies are usually used to treat GAD:  Medicine. Antidepressant medicine is usually prescribed for long-term daily control. Antianxiety medicines may be added in severe cases, especially when panic attacks occur.  Talk therapy (psychotherapy). Certain types of talk therapy can be helpful in treating GAD by providing support, education, and guidance. Options include:  Cognitive behavioral therapy (CBT). People learn coping skills and techniques to ease their anxiety. They learn to identify unrealistic or negative thoughts and behaviors and to replace them with positive ones.  Acceptance and commitment therapy (ACT). This treatment teaches people how to  be mindful as a way to cope with unwanted thoughts and feelings.  Biofeedback. This process trains you to manage your body's response (physiological response) through breathing techniques  and relaxation methods. You will work with a therapist while machines are used to monitor your physical symptoms.  Stress management techniques. These include yoga, meditation, and exercise. A mental health specialist can help determine which treatment is best for you. Some people see improvement with one type of therapy. However, other people require a combination of therapies. Follow these instructions at home:  Take over-the-counter and prescription medicines only as told by your health care provider.  Try to maintain a normal routine.  Try to anticipate stressful situations and allow extra time to manage them.  Practice any stress management or self-calming techniques as taught by your health care provider.  Do not punish yourself for setbacks or for not making progress.  Try to recognize your accomplishments, even if they are small.  Keep all follow-up visits as told by your health care provider. This is important. Contact a health care provider if:  Your symptoms do not get better.  Your symptoms get worse.  You have signs of depression, such as:  A persistently sad, cranky, or irritable mood.  Loss of enjoyment in activities that used to bring you joy.  Change in weight or eating.  Changes in sleeping habits.  Avoiding friends or family members.  Loss of energy for normal tasks.  Feelings of guilt or worthlessness. Get help right away if:  You have serious thoughts about hurting yourself or others. If you ever feel like you may hurt yourself or others, or have thoughts about taking your own life, get help right away. You can go to your nearest emergency department or call:  Your local emergency services (911 in the [place]  A suicide crisis helpline, such as the [facility] at 781-061-5432. This is open 24 hours a day. Summary  Generalized anxiety disorder (GAD) is a mental health disorder that involves worry that is not triggered by  a specific event.  People with GAD often worry excessively about many things in their lives, such as their health and family.  GAD may cause physical symptoms such as restlessness, trouble concentrating, sleep problems, frequent sweating, nausea, diarrhea, headaches, and trembling or muscle twitching.  A mental health specialist can help determine which treatment is best for you. Some people see improvement with one type of therapy. However, other people require a combination of therapies. This information is not intended to replace advice given to you by your health care provider. Make sure you discuss any questions you have with your health care provider. Document Released: 01/04/2013 Document Revised: 07/30/2016 Document Reviewed: 07/30/2016 Elsevier Interactive Patient Education  2017 [employer].

## 2016-12-31 NOTE — Telephone Encounter (Signed)
Spoke with patient and she understands plan.
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
## 2017-01-03 ENCOUNTER — Telehealth: Payer: Self-pay

## 2017-01-03 MED ORDER — TERBINAFINE HCL 250 MG PO TABS: 250.0000 mg | ORAL_TABLET | Freq: Every day | ORAL | 0 refills | Status: DC

## 2017-01-03 MED FILL — TERBINAFINE HCL 250 MG TAB: 250 | 30 days supply | Qty: 30 | Fill #0

## 2017-01-03 NOTE — Telephone Encounter (Signed)
Called, and left a message for patient to return call.  When she calls back please advise her that  Joy Barber wanted to let her know of normal lab work and the Terbinafine 250mg  [facility]. She is to take this daily as directed for 3 months and to return to clinic in 6 week for a lab draw only to check kidney function.

## 2017-01-03 NOTE — Telephone Encounter (Signed)
Joy Barber spoke with patient

## 2017-01-03 NOTE — Telephone Encounter (Signed)
Patient called back and I advised of Kim Harris's instructions below, patient verbalized understanding. Thanks!

## 2017-01-06 NOTE — Telephone Encounter (Signed)
Patient would like [place] to write a letter on her behalf for medicaid stating that she needs the medicaid to be able to get proper treatment for her appointments.

## 2017-01-06 NOTE — Telephone Encounter (Signed)
Left a vm for patient to callback 

## 2017-01-06 NOTE — Telephone Encounter (Signed)
You may call the patient back and advise that this will be [facility] for her to wait if this is a practice she prefers to the be followed by. However, it is her responsibility to phone back once her medicaid is approved to ensure that the referral is resubmitted.  Carroll Sage. [name], MSN, [facility] [address] [place], [place] [address] [phone]

## 2017-01-13 ENCOUNTER — Other Ambulatory Visit: Payer: Self-pay | Admitting: Family Medicine

## 2017-01-23 MED FILL — TERBINAFINE HCL 250 MG TAB: 250 | 30 days supply | Qty: 30 | Fill #1

## 2017-01-23 MED FILL — busPIRone HCL 5 MG TABS: 5 | 30 days supply | Qty: 90 | Fill #1

## 2017-01-23 MED FILL — GABAPENTIN 300 MG CAPSULE: 300 | 30 days supply | Qty: 90 | Fill #1

## 2017-01-30 ENCOUNTER — Telehealth: Payer: Self-pay

## 2017-01-31 ENCOUNTER — Ambulatory Visit (INDEPENDENT_AMBULATORY_CARE_PROVIDER_SITE_OTHER): Payer: No Typology Code available for payment source | Admitting: Family Medicine

## 2017-01-31 DIAGNOSIS — Z719 Counseling, unspecified: Secondary | ICD-10-CM

## 2017-01-31 MED ORDER — HYDROCHLOROTHIAZIDE 25 MG PO TABS: 25.0000 mg | ORAL_TABLET | Freq: Every day | ORAL | 3 refills | Status: AC

## 2017-01-31 MED FILL — HYDROCHLOROTHIAZIDE 25 MG T: 25 | 30 days supply | Qty: 30 | Fill #0

## 2017-01-31 NOTE — Progress Notes (Signed)
Blood pressure elevated today and prior visit. During an appointment yesterday at a psychiatry visit, blood pressure was elevated in 170's. Starting today on HCTZ 25 mg. Having her return in 5 days for hypertension follow-up.

## 2017-02-05 ENCOUNTER — Ambulatory Visit (INDEPENDENT_AMBULATORY_CARE_PROVIDER_SITE_OTHER): Payer: No Typology Code available for payment source | Admitting: Family Medicine
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
## 2017-02-05 NOTE — Progress Notes (Deleted)
Patient ID: Joy Barber, female    DOB: 1961/05/23, 56 y.o.   MRN: [id number]  PCP: Scot Jun, FNP  No chief complaint on file.   Subjective:  HPI  Joy Barber is a 56 y.o. female presents for evaluation of No chief complaint on file.    Social History   Social History  . Marital status: Divorced    Spouse name: n/a  . Number of children: 1  . 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 Smoking status: Current Some Day Smoker    Packs/day: 0.20    Types: Cigarettes    Last attempt to quit: 03/21/2015  . Smokeless tobacco: Former Systems developer  . Alcohol use Yes     Comment: Occasionally  . Drug use: No  . Sexual activity: Yes    Birth control/ protection: Surgical   Other Topics Concern  . 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    Family History  Problem Relation Age of Onset  . Cancer Mother        breast  . Diabetes Father   . Cancer Father        testicular  . Hypertension Brother   . Heart disease Brother      Review of Systems  Patient Active Problem List   Diagnosis Date Noted  . ADHD (attention deficit hyperactivity disorder) 06/11/2013  . PTSD (post-traumatic stress disorder) 06/11/2013  . History of seizures 06/11/2013  . TBI (traumatic brain injury) ([place]) 06/11/2013    Allergies  Allergen Reactions  . Nsaids     History of subdural hematoma.  . Penicillins Itching    whelps    Prior to Admission medications   Medication Sig Start Date End Date Taking? Authorizing Provider  acetaminophen (TYLENOL) 500 MG tablet Take 1,000 mg by mouth every 6 (six) hours as needed. For pain.    [provider]  busPIRone (BUSPAR) 5 MG tablet Take 1 tablet (5 mg total) by mouth 3 (three) times daily. 12/31/16   Scot Jun, FNP  Dextromethorphan HBr 15 MG CAPS Take 15 mg by  mouth daily.    [provider]  gabapentin (NEURONTIN) 300 MG capsule Take 1 capsule (300 mg total) by mouth 3 (three) times daily. 12/31/16   Scot Jun, FNP  Ginger, Zingiber officinalis, (GINGER ROOT PO) Take 1,100 mg by mouth 2 (two) times daily.    [provider]  hydrochlorothiazide (HYDRODIURIL) 25 MG tablet Take 1 tablet (25 mg total) by mouth daily. 01/31/17   Scot Jun, FNP  Melatonin 200 MCG TABS Take 200 mcg by mouth at bedtime.    [provider]  NON FORMULARY Take 2,000 mg by mouth daily. FIBRENZA 1000 mg    [provider]  OVER THE COUNTER MEDICATION Take 800 mg by mouth 2 (two) times daily. PIRACETAM 800 mg    [provider]  OVER THE COUNTER MEDICATION Take 200 mg by mouth 2 (two) times daily. GABA    [provider]  OVER THE COUNTER MEDICATION Take 1,000 mg by mouth daily. MSM    [provider]  OVER THE COUNTER MEDICATION Take 160 mg by mouth 3 (three) times daily. HOT FLASH    [provider]  OVER THE COUNTER MEDICATION Take 1 tablet by  mouth 2 (two) times daily. VITAMIN B + ZINC    [provider]  Pediatric Multivit-Minerals-C (GUMMI BEAR MULTIVITAMIN/MIN) CHEW Chew 1 tablet by mouth daily.    [provider]  predniSONE (DELTASONE) 20 MG tablet Take 1 tablet (20 mg total) by mouth daily. Take 3 tablets (60 mg) daily for 7 days, then take 2 tablets (40 mg) daily for 7 days, then take 1 tablet (20 mg) daily for 7 days. Patient not taking: Reported on 12/31/2016 04/20/15   Montine Circle, PA-C  salicylic acid 17 % gel Apply topically daily. Patient not taking: Reported on 12/31/2016 07/02/16   Frederica Kuster, PA-C  sulfamethoxazole-trimethoprim (SEPTRA DS) 800-160 MG per tablet Take 1 tablet by mouth 2 (two) times daily. Patient not taking: Reported on 12/31/2016 12/15/14   Alvina Chou, PA-C  terbinafine (LAMISIL) 250 MG tablet Take 1 tablet (250 mg total) by  mouth daily. 01/03/17   Scot Jun, FNP  traMADol (ULTRAM) 50 MG tablet Take 1 tablet (50 mg total) by mouth every 8 (eight) hours as needed for pain. Patient not taking: Reported on 12/31/2016 06/11/13   Harrison Mons, PA-C  TURMERIC PO Take 1 tablet by mouth daily.    [provider]  VALERIAN ROOT PO Take 75 mg by mouth at bedtime.    [provider]  vitamin C (ASCORBIC ACID) 500 MG tablet Take 500 mg by mouth daily.    [provider]    Past Medical, Surgical Family and Social History reviewed and updated.    Objective:    Wt Readings from Last 3 Encounters:  12/31/16 161 lb (73 kg)  07/02/16 145 lb (65.8 kg)  04/20/15 145 lb (65.8 kg)    Physical Exam         Assessment & Plan:  There are no diagnoses linked to this encounter.

## 2017-02-07 ENCOUNTER — Ambulatory Visit: Payer: No Typology Code available for payment source | Admitting: Family Medicine

## 2017-02-09 NOTE — Progress Notes (Signed)
Patient checked in. Decided to reschedule and left.

## 2017-02-11 ENCOUNTER — Ambulatory Visit: Payer: No Typology Code available for payment source | Admitting: Family Medicine

## 2017-02-12 ENCOUNTER — Ambulatory Visit: Payer: No Typology Code available for payment source | Admitting: Family Medicine

## 2017-02-12 ENCOUNTER — Ambulatory Visit (INDEPENDENT_AMBULATORY_CARE_PROVIDER_SITE_OTHER): Payer: No Typology Code available for payment source | Admitting: Family Medicine

## 2017-02-12 ENCOUNTER — Encounter: Payer: Self-pay | Admitting: Family Medicine

## 2017-02-12 VITALS — BP 160/82 | HR 77 | Temp 98.0°F | Resp 16 | Ht 64.0 in | Wt 156.0 lb

## 2017-02-12 DIAGNOSIS — B351 Tinea unguium: Secondary | ICD-10-CM

## 2017-02-12 DIAGNOSIS — R519 Headache, unspecified: Secondary | ICD-10-CM

## 2017-02-12 DIAGNOSIS — R51 Headache: Secondary | ICD-10-CM

## 2017-02-12 DIAGNOSIS — M79672 Pain in left foot: Secondary | ICD-10-CM

## 2017-02-12 DIAGNOSIS — I10 Essential (primary) hypertension: Secondary | ICD-10-CM

## 2017-02-12 LAB — HEPATIC FUNCTION PANEL
ALT: 16 U/L (ref 6–29)
AST: 20 U/L (ref 10–35)
Albumin: 4.3 g/dL (ref 3.6–5.1)
Alkaline Phosphatase: 59 U/L (ref 33–130)
BILIRUBIN INDIRECT: 0.4 mg/dL (ref 0.2–1.2)
Bilirubin, Direct: 0.1 mg/dL (ref <=0.2)
Total Bilirubin: 0.5 mg/dL (ref 0.2–1.2)
Total Protein: 7.1 g/dL (ref 6.1–8.1)

## 2017-02-12 MED ORDER — LISINOPRIL 10 MG PO TABS: 10.0000 mg | ORAL_TABLET | Freq: Every day | ORAL | 3 refills | Status: DC

## 2017-02-12 MED FILL — LISINOPRIL 10 MG TABLET: 10 | 30 days supply | Qty: 30 | Fill #0

## 2017-02-12 NOTE — Patient Instructions (Addendum)
Lisinopril 10 mg and Hydrochlorothiazide 25 mg once daily for blood pressure.  Return in two weeks for a blood pressure recheck.  You will be notified if lab work, indicates any change in therapy.   I have placed another referral to neurology for your to any available practice and placed a referral for you to see podiatry.  For foot pain, continue Gabapentin as prescribed and you may take over the counter tylenol and or naproxen.     Hypertension Hypertension is another name for high blood pressure. High blood pressure forces your heart to work harder to pump blood. This can cause problems over time. There are two numbers in a blood pressure reading. There is a top number (systolic) over a bottom number (diastolic). It is best to have a blood pressure below 120/80. Healthy choices can help lower your blood pressure. You may need medicine to help lower your blood pressure if:  Your blood pressure cannot be lowered with healthy choices.  Your blood pressure is higher than 130/80. Follow these instructions at home: Eating and drinking   If directed, follow the DASH eating plan. This diet includes:  Filling half of your plate at each meal with fruits and vegetables.  Filling one quarter of your plate at each meal with whole grains. Whole grains include whole wheat pasta, brown rice, and whole grain bread.  Eating or drinking low-fat dairy products, such as skim milk or low-fat yogurt.  Filling one quarter of your plate at each meal with low-fat (lean) proteins. Low-fat proteins include fish, skinless chicken, eggs, beans, and tofu.  Avoiding fatty meat, cured and processed meat, or chicken with skin.  Avoiding premade or processed food.  Eat less than 1,500 mg of salt (sodium) a day.  Limit alcohol use to no more than 1 drink a day for nonpregnant women and 2 drinks a day for men. One drink equals 12 oz of beer, 5 oz of wine, or 1 oz of hard liquor. Lifestyle   Work with your  doctor to stay at a healthy weight or to lose weight. Ask your doctor what the best weight is for you.  Get at least 30 minutes of exercise that causes your heart to beat faster (aerobic exercise) most days of the week. This may include walking, swimming, or biking.  Get at least 30 minutes of exercise that strengthens your muscles (resistance exercise) at least 3 days a week. This may include lifting weights or pilates.  Do not use any products that contain nicotine or tobacco. This includes cigarettes and e-cigarettes. If you need help quitting, ask your doctor.  Check your blood pressure at home as told by your doctor.  Keep all follow-up visits as told by your doctor. This is important. Medicines   Take over-the-counter and prescription medicines only as told by your doctor. Follow directions carefully.  Do not skip doses of blood pressure medicine. The medicine does not work as well if you skip doses. Skipping doses also puts you at risk for problems.  Ask your doctor about side effects or reactions to medicines that you should watch for. Contact a doctor if:  You think you are having a reaction to the medicine you are taking.  You have headaches that keep coming back (recurring).  You feel dizzy.  You have swelling in your ankles.  You have trouble with your vision. Get help right away if:  You get a very bad headache.  You start to feel confused.  You  feel weak or numb.  You feel faint.  You get very bad pain in your:  Chest.  Belly (abdomen).  You throw up (vomit) more than once.  You have trouble breathing. Summary  Hypertension is another name for high blood pressure.  Making healthy choices can help lower blood pressure. If your blood pressure cannot be controlled with healthy choices, you may need to take medicine. This information is not intended to replace advice given to you by your health care provider. Make sure you discuss any questions you have  with your health care provider. Document Released: 02/26/2008 Document Revised: 08/07/2016 Document Reviewed: 08/07/2016 Elsevier Interactive Patient Education  2017 [employer].

## 2017-02-12 NOTE — Progress Notes (Signed)
Patient ID: Joy Barber, female    DOB: 1961-04-07, 56 y.o.   MRN: [id number]  PCP: Scot Jun, FNP  Chief Complaint  Patient presents with  . Headache  . Foot Pain    left    Subjective:  HPI Joy Barber is a 56 y.o. female presents for blood pressure follow-up only.  Reiterated to patient that the focus of today's visit is hypertension. She continues to complain of foot pain from plantar warts, and headaches stemming form TBI.  Resubmitted neurology and podiatry referral. History is rather difficult to obtain as patient is talking excessively and is having great difficulty focusing.   Hypertension  Reports currently taking HCTZ  25 mg daily as prescribed.  The facility where she resides has a nurse onsite that has recently begam to monitor her blood pressure routinely. She reports that over the last two weeks even with medication her blood pressures have remained elevated greater than 150/90. Two readings from this week were 198/110 and 178/102.  Denies chest pain or any unilateral and or bilateral weakness. She is a headaches sufferer.  Headaches  Headache, routine occurrence since head injury in 2012 . These headaches has been chronic since 2012. Headaches occur at the location of her prior head injury. No further changes in character, quality, or frequency of headaches. No new associated symptoms from headaches.   Social History   Social History  . Marital status: Divorced    Spouse name: n/a  . Number of children: 1  . 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 Smoking status: Current Some Day Smoker    Packs/day: 0.20    Types: Cigarettes    Last attempt to quit: 03/21/2015  . Smokeless tobacco: Former Systems developer  . Alcohol use Yes     Comment: Occasionally  . Drug use: No  . Sexual activity: Yes    Birth control/ protection: Surgical   Other Topics Concern  . Not on file    Social History Narrative   Former Marine scientist, prior to TBI.   Working on Contractor at [employer].    Family History  Problem Relation Age of Onset  . Cancer Mother        breast  . Diabetes Father   . Cancer Father        testicular  . Hypertension Brother   . Heart disease Brother    Review of Systems See HPI   Patient Active Problem List   Diagnosis Date Noted  . ADHD (attention deficit hyperactivity disorder) 06/11/2013  . PTSD (post-traumatic stress disorder) 06/11/2013  . History of seizures 06/11/2013  . TBI (traumatic brain injury) ([place]) 06/11/2013    Allergies  Allergen Reactions  . Nsaids     History of subdural hematoma.  . Penicillins Itching    whelps    Prior to Admission medications   Medication Sig Start Date End Date Taking? Authorizing Provider  acetaminophen (TYLENOL) 500 MG tablet Take 1,000 mg by mouth every 6 (six) hours as needed. For pain.   Yes [provider]  busPIRone (BUSPAR) 5 MG tablet Take 1 tablet (5 mg total) by mouth 3 (three) times daily. 12/31/16  Yes Scot Jun, FNP  Dextromethorphan HBr 15 MG CAPS Take 15 mg by mouth daily.   Yes [provider]  gabapentin (NEURONTIN) 300 MG capsule Take 1 capsule (300 mg total) by mouth 3 (  three) times daily. 12/31/16  Yes Scot Jun, FNP  Ginger, Zingiber officinalis, (GINGER ROOT PO) Take 1,100 mg by mouth 2 (two) times daily.   Yes [provider]  hydrochlorothiazide (HYDRODIURIL) 25 MG tablet Take 1 tablet (25 mg total) by mouth daily. 01/31/17  Yes Scot Jun, FNP  Melatonin 200 MCG TABS Take 200 mcg by mouth at bedtime.   Yes [provider]  NON FORMULARY Take 2,000 mg by mouth daily. FIBRENZA 1000 mg   Yes [provider]  OVER THE COUNTER MEDICATION Take 800 mg by mouth 2 (two) times daily. PIRACETAM 800 mg   Yes [provider]  OVER THE COUNTER MEDICATION Take 200 mg by mouth 2 (two) times daily.  GABA   Yes [provider]  OVER THE COUNTER MEDICATION Take 1,000 mg by mouth daily. MSM   Yes [provider]  OVER THE COUNTER MEDICATION Take 160 mg by mouth 3 (three) times daily. HOT FLASH   Yes [provider]  OVER THE COUNTER MEDICATION Take 1 tablet by mouth 2 (two) times daily. VITAMIN B + ZINC   Yes [provider]  Pediatric Multivit-Minerals-C (GUMMI BEAR MULTIVITAMIN/MIN) CHEW Chew 1 tablet by mouth daily.   Yes [provider]  salicylic acid 17 % gel Apply topically daily. 07/02/16  Yes Law, [place], PA-C  terbinafine (LAMISIL) 250 MG tablet Take 1 tablet (250 mg total) by mouth daily. 01/03/17  Yes Scot Jun, FNP  traMADol (ULTRAM) 50 MG tablet Take 1 tablet (50 mg total) by mouth every 8 (eight) hours as needed for pain. 06/11/13  Yes Jeffery, Chelle, PA-C  TURMERIC PO Take 1 tablet by mouth daily.   Yes [provider]  VALERIAN ROOT PO Take 75 mg by mouth at bedtime.   Yes [provider]  vitamin C (ASCORBIC ACID) 500 MG tablet Take 500 mg by mouth daily.   Yes [provider]    Past Medical, Surgical Family and Social History reviewed and updated.    Objective:   Today's Vitals   02/12/17 1011 02/12/17 1030  BP: (!) 150/94 (!) 120/98  Pulse: 77   Resp: 16   Temp: 98 F (36.7 C)   TempSrc: Oral   SpO2: 98%   Weight: 156 lb (70.8 kg)   Height: 5\' 4"  (1.626 m)     Wt Readings from Last 3 Encounters:  02/12/17 156 lb (70.8 kg)  12/31/16 161 lb (73 kg)  07/02/16 145 lb (65.8 kg)   Physical Exam  Constitutional: She appears well-developed and well-nourished.  HENT:  Head: Normocephalic.  Scar on the right lateral upper scalp   Eyes: Conjunctivae and EOM are normal. Pupils are equal, round, and reactive to light.  Psychiatric: Her speech is rapid and/or pressured. She is agitated and hyperactive. Thought content is paranoid and delusional. She expresses impulsivity.  I have  observed these behaviors during prior office visit and when patient spontaneously shows up at the office. These behaviors I suspect are her baseline     Assessment & Plan:  1. Essential hypertension -Adding Lisinopril 10 mg once daily -Continue Hydrochlorothiazide 25 mg once daily  2. Nonintractable headache, unspecified chronicity pattern, unspecified headache type Chronic headaches. Possible exacerbated by uncontrolled blood pressure.  Treating blood pressure, and patient is taking Gabapentin for headache.  Will defer any other evaluation to neurology.  - Ambulatory referral to Neurology  3. Foot pain, left - Ambulatory referral to Podiatry  4.  Toenail fungus - Hepatic Function Panel -Complete Terbinafine (Lamisil) 250 mg once daily until all medication is completed.   RTC: 6 months for routine chronic disease management, 2 weeks for a blood pressure recheck.   Carroll Sage. [name], MSN, FNP-C [facility]  [address] Barbara Cower [place], [place] [address] [phone]

## 2017-02-21 ENCOUNTER — Telehealth: Payer: Self-pay

## 2017-02-21 ENCOUNTER — Other Ambulatory Visit: Payer: Self-pay | Admitting: Family Medicine

## 2017-02-21 MED FILL — ?BUSPIRONE HCL 5 MG TABLET: 5 | 30 days supply | Qty: 90 | Fill #2

## 2017-02-21 MED FILL — GABAPENTIN 300 MG CAPSULE: 300 | 30 days supply | Qty: 90 | Fill #0

## 2017-03-03 MED FILL — HYDROCHLOROTHIAZIDE 25 MG T: 25 | 30 days supply | Qty: 30 | Fill #1

## 2017-03-05 ENCOUNTER — Ambulatory Visit: Payer: Self-pay | Attending: Internal Medicine

## 2017-03-12 MED FILL — ?LISINOPRIL 10 MG TABLET: 10 | 30 days supply | Qty: 30 | Fill #1

## 2017-03-17 ENCOUNTER — Other Ambulatory Visit: Payer: Self-pay | Admitting: Family Medicine

## 2017-03-17 DIAGNOSIS — F411 Generalized anxiety disorder: Secondary | ICD-10-CM

## 2017-03-17 MED FILL — ?BUSPIRONE HCL 5 MG TABLET: 5 | 30 days supply | Qty: 90 | Fill #3

## 2017-03-17 MED FILL — GABAPENTIN 300 MG CAPSULE: 300 | 30 days supply | Qty: 90 | Fill #1

## 2017-03-18 ENCOUNTER — Encounter: Payer: Self-pay | Admitting: Neurology

## 2017-04-01 MED FILL — HYDROCHLOROTHIAZIDE 25 MG T: 25 | 30 days supply | Qty: 30 | Fill #2

## 2017-04-02 ENCOUNTER — Ambulatory Visit (INDEPENDENT_AMBULATORY_CARE_PROVIDER_SITE_OTHER): Payer: Self-pay

## 2017-04-02 ENCOUNTER — Ambulatory Visit (INDEPENDENT_AMBULATORY_CARE_PROVIDER_SITE_OTHER): Payer: Self-pay | Admitting: Podiatry

## 2017-04-02 ENCOUNTER — Ambulatory Visit: Payer: Self-pay | Attending: Internal Medicine

## 2017-04-02 DIAGNOSIS — M722 Plantar fascial fibromatosis: Secondary | ICD-10-CM

## 2017-04-02 DIAGNOSIS — L84 Corns and callosities: Secondary | ICD-10-CM

## 2017-04-04 ENCOUNTER — Ambulatory Visit: Payer: Self-pay | Admitting: Family Medicine

## 2017-04-07 ENCOUNTER — Ambulatory Visit: Payer: Self-pay

## 2017-04-07 ENCOUNTER — Ambulatory Visit: Payer: Self-pay | Attending: Internal Medicine

## 2017-04-11 ENCOUNTER — Telehealth: Payer: Self-pay | Admitting: Podiatry

## 2017-04-11 NOTE — Telephone Encounter (Signed)
I'll do the surgery. She needs medical clearance though from her PCP since it will be at [facility]. Schedule it for 4-6 weeks out.  Thanks, Dr. Amalia Hailey

## 2017-04-11 NOTE — Telephone Encounter (Signed)
Pt states at 04/02/2017 appt her surgery was set up and at a later period cancelled and she wanted to know why she has [facility] 100% coverage until 05/2017. Pt states she would like to know what can be done, the knot on the bottom of the right foot is growing.Left message informing pt I would contact Dr. Amalia Hailey for alternative treatments for her and call again on 04/14/2017.

## 2017-04-11 NOTE — Telephone Encounter (Signed)
Pt called wondering why her appointment for 02 august was cancelled and was told by one of [place] that we were not doing it. I tried multiple times calling both RN's Marcy Siren and Ernestine Conrad to which neither answered the phone. I got back on the phone with the pt and told her I could not get in touch with either of the nurses the best I could do since I route the calls is schedule her an appointment to come back in to see dr. Amalia Hailey but that he is booked out until august. Pt started to yell at me and tell me I should walk around with something the size of a golf ball in the arch of my foot and see how it feels. I told the pt that I am not a nurse, that I answer their calls and route them where they need to go. I also told the pt she did not have the right to speak rudely to me the way she was. I then transferred her to our surgical coordinator [facility] in which Delydia told her she needed to speak to a nurse and transferred the pt to the nurse line.

## 2017-04-13 NOTE — Progress Notes (Signed)
   HPI: Patient presents today as a new patient for evaluation of knots on the arches of her bilateral feet. She also complains of painful callus lesions to bilateral great toes. Patient also complains of discolored nails and she states that she is taken Lamisil in the past. Patient presents today for further treatment and evaluation   Physical Exam: General: The patient is alert and oriented x3 in no acute distress.  Dermatology: Hyperkeratotic callus lesions also noted to the left foot consistent with porokeratosis. Skin is warm, dry and supple bilateral lower extremities. Negative for open lesions or macerations.  Vascular: Palpable pedal pulses bilaterally. No edema or erythema noted. Capillary refill within normal limits.  Neurological: Epicritic and protective threshold grossly intact bilaterally.   Musculoskeletal Exam: Painful palpable masses noted to the medial longitudinal arches of the bilateral feet consistent with plantar fascial fibromas. There is exquisite pain on palpation noted more symptomatic on the right than the left.  Radiographic Exam:  Normal osseous mineralization. Joint spaces preserved. No fracture/dislocation/boney destruction.    Assessment: 1. Bilateral plantar fibromas 2. Porokeratosis left foot  Plan of Care:  1. Patient was evaluated. X-rays reviewed today 2. Today we discussed the conservative versus surgical management of the plantar fibromas. Plantar fibromas of been present for several years and increasingly painful. The patient opts for surgical correction. Surgery will consist of excision of the plantar fibroma of the right foot. We will do  One foot at a time. Today a postoperative shoe was dispensed. 3. Authorization for surgery initiated today. Surgery will consist of excision of plantar fibroma right foot. All possible complications and details of the procedure were explained. No guarantees were expressed or implied. The patient does have a  complicated past medical history and will require medical clearance. 4. Excisional debridement of the porokeratosis lesion was performed using a chisel blade without incident or bleeding. 5. Return to clinic 1 week postop   Edrick Kins, DPM [facility]  Dr. Edrick Kins, DPM    [address], [place] [address]                Office [phone]  Fax [phone]

## 2017-04-14 ENCOUNTER — Ambulatory Visit: Payer: Self-pay | Admitting: Family Medicine

## 2017-04-15 ENCOUNTER — Telehealth: Payer: Self-pay | Admitting: Podiatry

## 2017-04-15 NOTE — Telephone Encounter (Signed)
I received a message from [place] on Friday 20 July at 3:53 pm that she would talk to Dr. Amalia Hailey and get back to me on Monday. Well, I still haven't heard anything. If she could please call me back. Also, I'm still having issues with my right foot and where he took the thing off it is still very sore.

## 2017-04-15 NOTE — Telephone Encounter (Addendum)
I told pt, I had informed Dr. Amalia Hailey of pt's request for surgery and Dr. Amalia Hailey had reviewed his schedule and the hospital's schedule and she would need to be scheduled about 6 weeks from now at the hospital. Pt states her 100% coverage runs out 06/05/2017. I told her I would inform Dr. Amalia Hailey and Murlean Caller, Surgery Coordinator of the deadline date and she would call her with a schedule time. Murlean Caller reminded me to inform pt of the physical exam that was needed prior to the surgery date, and I informed pt.

## 2017-04-18 NOTE — Telephone Encounter (Signed)
I attempted to call patient to get a date for surgery.  I left her a message to call next week to set up the date.

## 2017-04-22 ENCOUNTER — Telehealth: Payer: Self-pay

## 2017-04-22 ENCOUNTER — Telehealth: Payer: Self-pay | Admitting: *Deleted

## 2017-04-22 NOTE — Telephone Encounter (Signed)
Afternoon. Generally depends on my surgery center schedule. But a Thursday at 3pm would probably be safe. 1 hour case.  Thanks, Dr. Amalia Hailey

## 2017-04-22 NOTE — Telephone Encounter (Signed)
Pt states she received a call from [facility] on 04/18/2017 instructing her to schedule her surgery with Janett Billow or Marcy Siren. Pt states she is with her counselor.

## 2017-04-22 NOTE — Telephone Encounter (Signed)
LVM informing patient to keep her appt  With her PCP to complete her H&P form for her upcoming surgery. I also informed her that when Delydia returns she will contact [facility] to obtain a specific time and date for her surgery

## 2017-04-23 ENCOUNTER — Encounter: Payer: Self-pay | Admitting: Family Medicine

## 2017-04-23 ENCOUNTER — Other Ambulatory Visit: Payer: Self-pay

## 2017-04-23 ENCOUNTER — Ambulatory Visit (INDEPENDENT_AMBULATORY_CARE_PROVIDER_SITE_OTHER): Payer: Self-pay | Admitting: Family Medicine

## 2017-04-23 VITALS — BP 152/100 | HR 76 | Temp 98.7°F | Resp 16 | Ht 64.0 in | Wt 147.2 lb

## 2017-04-23 DIAGNOSIS — I10 Essential (primary) hypertension: Secondary | ICD-10-CM

## 2017-04-23 DIAGNOSIS — Z0000 Encounter for general adult medical examination without abnormal findings: Secondary | ICD-10-CM

## 2017-04-23 DIAGNOSIS — F411 Generalized anxiety disorder: Secondary | ICD-10-CM

## 2017-04-23 LAB — CBC WITH DIFFERENTIAL/PLATELET
Basophils Absolute: 0 cells/uL (ref 0–200)
Basophils Relative: 0 %
EOS ABS: 79 {cells}/uL (ref 15–500)
EOS PCT: 1 %
HCT: 42.8 % (ref 35.0–45.0)
HEMOGLOBIN: 14.3 g/dL (ref 11.7–15.5)
LYMPHS ABS: 2528 {cells}/uL (ref 850–3900)
Lymphocytes Relative: 32 %
MCH: 29.8 pg (ref 27.0–33.0)
MCHC: 33.4 g/dL (ref 32.0–36.0)
MCV: 89.2 fL (ref 80.0–100.0)
MPV: 10.5 fL (ref 7.5–12.5)
Monocytes Absolute: 474 cells/uL (ref 200–950)
Monocytes Relative: 6 %
NEUTROS ABS: 4819 {cells}/uL (ref 1500–7800)
Neutrophils Relative %: 61 %
PLATELETS: 337 10*3/uL (ref 140–400)
RBC: 4.8 MIL/uL (ref 3.80–5.10)
RDW: 13.9 % (ref 11.0–15.0)
WBC: 7.9 10*3/uL (ref 3.8–10.8)

## 2017-04-23 LAB — POCT URINALYSIS DIP (DEVICE)
Glucose, UA: NEGATIVE mg/dL
Hgb urine dipstick: NEGATIVE
LEUKOCYTES UA: NEGATIVE
NITRITE: NEGATIVE
Protein, ur: 30 mg/dL — AB
Specific Gravity, Urine: >=1.03 (ref 1.005–1.030)
UROBILINOGEN UA: 0.2 mg/dL (ref 0.0–1.0)
pH: 6 (ref 5.0–8.0)

## 2017-04-23 LAB — RETICULOCYTES
ABS Retic: 28800 cells/uL (ref 20000–80000)
RBC.: 4.8 MIL/uL (ref 3.80–5.10)
Retic Ct Pct: 0.6 %

## 2017-04-23 MED ORDER — BUSPIRONE HCL 5 MG PO TABS: 5.0000 mg | ORAL_TABLET | Freq: Three times a day (TID) | ORAL | 3 refills | Status: DC

## 2017-04-23 MED ORDER — GABAPENTIN 300 MG PO CAPS: 300.0000 mg | ORAL_CAPSULE | Freq: Three times a day (TID) | ORAL | 1 refills | Status: DC

## 2017-04-23 MED ORDER — NIFEDIPINE ER OSMOTIC RELEASE 30 MG PO TB24: 30.0000 mg | ORAL_TABLET | Freq: Every day | ORAL | 0 refills | Status: DC

## 2017-04-23 MED FILL — busPIRone HCL 5 MG TABS: 5 | 30 days supply | Qty: 90 | Fill #0

## 2017-04-23 MED FILL — GABAPENTIN 300 MG CAPSULE: 300 | 30 days supply | Qty: 90 | Fill #0

## 2017-04-23 MED FILL — NIFEDIPINE ER 30 MG TABLET: 30 | 30 days supply | Qty: 30 | Fill #0

## 2017-04-23 NOTE — Patient Instructions (Addendum)
Return to pick-up surgical clearance paperwork on Monday after 1:00 pm. For blood pressure management, I am starting you on nifedipine 30 mg once daily. Continue hydrochlorothiazide 25 mg once daily.

## 2017-04-23 NOTE — Progress Notes (Signed)
Patient ID: Joy Barber, female    DOB: 1960-12-21, 56 y.o.   MRN: [id number]  PCP: Scot Jun, FNP  Chief Complaint  Patient presents with  . Medical Clearance    Subjective:  HPI Joy Barber is a 56 y.o. female presents to obtain medical clearance for surgery. [name] is current under the care of podiatry, [place], and will undergo surgery to her right foot for excision of plantar fibroma. She will need a form completed authorizing medical clearance for this procedure. Cozy reports that the surgery will not occur until the end of August mostly likely. [name]'s blood pressure is very elevated today, 150/90. She admits discontinuing the lisinopril due to concerns of sun over-exposure, causing a drug interaction that has caused her to feel "weird". she only takes her hydrochlorothiazide if her blood pressure is elevated. She is concern for dehydration as she is outside riding her bike several hours per day. Joy Barber denies chest pain, shortness of breath, dizziness, fatigue, weakness, has not experienced recent headaches.  Social History   Social History  . Marital status: Divorced    Spouse name: n/a  . Number of children: 1  . 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 Smoking status: Current Some Day Smoker    Packs/day: 0.20    Types: Cigarettes    Last attempt to quit: 03/21/2015  . Smokeless tobacco: Former Systems developer  . Alcohol use Yes     Comment: Occasionally  . Drug use: No  . Sexual activity: Yes    Birth control/ protection: Surgical   Other Topics Concern  . 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    Family History  Problem Relation Age of Onset  . Cancer Mother        breast  . Diabetes Father   . Cancer Father        testicular  . Hypertension Brother   . Heart disease Brother     Review of Systems See HPI Patient Active Problem List   Diagnosis Date Noted  . ADHD (attention deficit hyperactivity disorder) 06/11/2013  . PTSD (post-traumatic stress disorder) 06/11/2013  . History of seizures 06/11/2013  . TBI (traumatic brain injury) ([place]) 06/11/2013    Allergies  Allergen Reactions  . Nsaids     History of subdural hematoma.  . Penicillins Itching    whelps    Prior to Admission medications   Medication Sig Start Date End Date Taking? Authorizing Provider  acetaminophen (TYLENOL) 500 MG tablet Take 1,000 mg by mouth every 6 (six) hours as needed. For pain.   Yes [provider]  busPIRone (BUSPAR) 5 MG tablet TAKE 1 TABLET BY MOUTH 3 TIMES DAILY. 03/17/17  Yes Scot Jun, FNP  Dextromethorphan HBr 15 MG CAPS Take 15 mg by mouth daily.   Yes [provider]  gabapentin (NEURONTIN) 300 MG capsule TAKE 1 CAPSULE BY MOUTH 3 TIMES DAILY. 03/17/17  Yes Scot Jun, FNP  Ginger, Zingiber officinalis, (GINGER ROOT PO) Take 1,100 mg by mouth 2 (two) times daily.   Yes [provider]  hydrochlorothiazide (HYDRODIURIL) 25 MG tablet Take 1 tablet (25 mg total) by mouth daily. 01/31/17  Yes Scot Jun, FNP  Melatonin 200 MCG TABS Take 200 mcg by mouth at bedtime.  Yes [provider]  NON FORMULARY Take 2,000 mg by mouth daily. FIBRENZA 1000 mg   Yes [provider]  OVER THE COUNTER MEDICATION Take 800 mg by mouth 2 (two) times daily. PIRACETAM 800 mg   Yes [provider]  OVER THE COUNTER MEDICATION Take 200 mg by mouth 2 (two) times daily. GABA   Yes [provider]  OVER THE COUNTER MEDICATION Take 1,000 mg by mouth daily. MSM   Yes [provider]  OVER THE COUNTER MEDICATION Take 160 mg by mouth 3 (three) times daily. HOT FLASH   Yes [provider]  OVER THE COUNTER MEDICATION Take 1 tablet by mouth 2 (two) times daily. VITAMIN B + ZINC   Yes [provider]  Pediatric Multivit-Minerals-C (GUMMI BEAR MULTIVITAMIN/MIN) CHEW Chew 1 tablet by mouth daily.   Yes [provider]  salicylic acid 17 % gel Apply topically daily. 07/02/16  Yes Law, [place], PA-C  terbinafine (LAMISIL) 250 MG tablet Take 1 tablet (250 mg total) by mouth daily. 01/03/17  Yes Scot Jun, FNP  traMADol (ULTRAM) 50 MG tablet Take 1 tablet (50 mg total) by mouth every 8 (eight) hours as needed for pain. 06/11/13  Yes Jeffery, Chelle, PA-C  TURMERIC PO Take 1 tablet by mouth daily.   Yes [provider]  VALERIAN ROOT PO Take 75 mg by mouth at bedtime.   Yes [provider]  vitamin C (ASCORBIC ACID) 500 MG tablet Take 500 mg by mouth daily.   Yes [provider]  lisinopril (PRINIVIL,ZESTRIL) 10 MG tablet Take 1 tablet (10 mg total) by mouth daily. Patient not taking: Reported on 04/23/2017 02/12/17   Scot Jun, FNP    Past Medical, Surgical Family and Social History reviewed and updated.    Objective:   Today's Vitals   04/23/17 1108  BP: (!) 150/90  Pulse: 76  Resp: 16  Temp: 98.7 F (37.1 C)  TempSrc: Oral  SpO2: 98%  Weight: 147 lb 3.2 oz (66.8 kg)  Height: 5\' 4"  (1.626 m)    Wt Readings from Last 3 Encounters:  04/23/17 147 lb 3.2 oz (66.8 kg)  02/12/17 156 lb (70.8 kg)  12/31/16 161 lb (73 kg)    Physical Exam  Constitutional: She is oriented to person, place, and time. She appears well-developed and well-nourished.  HENT:  Head: Normocephalic and atraumatic.  Eyes: Pupils are equal, round, and reactive to light. Conjunctivae and EOM are normal.  Neck: Normal range of motion. Neck supple. No thyromegaly present.  Cardiovascular: Normal rate, regular rhythm, normal heart sounds and intact distal pulses.   EKG, NSR, negative for ischemia   Pulmonary/Chest: Effort normal and breath sounds normal.  Musculoskeletal: Normal range of motion.  Neurological: She is alert and oriented to  person, place, and time. She displays a negative Romberg sign. Coordination normal. GCS eye subscore is 4. GCS verbal subscore is 5. GCS motor subscore is 6.  Reflex Scores:      Bicep reflexes are 2+ on the right side and 2+ on the left side.      Patellar reflexes are 2+ on the right side and 2+ on the left side.      Achilles reflexes are 2+ on the right side and 2+ on the left side. Skin: Skin is warm and dry.  Psychiatric: Judgment and thought content normal. Her mood appears anxious. Her speech is rapid and/or pressured. She is agitated.  All behaviors consistent  with patient's normal baseline.    Assessment & Plan:  1. Examination, general medical - CBC with Differential - COMPLETE METABOLIC PANEL WITH GFR - Reticulocytes - EKG 12-Lead  2. Essential hypertension, uncontrolled  -Discontinue lisinopril  -Start Nifedipine 30 mg once daily  -Continue hydrochlorothiazide 25 mg daily   RTC: 04/28/2017 bood pressure check and pick-up surgical clearance form  Carroll Sage. [name], MSN, FNP-C [facility]  [address] Barbara Cower [place], [place] [address] [phone]

## 2017-04-24 LAB — COMPLETE METABOLIC PANEL WITH GFR
ALT: 10 U/L (ref 6–29)
AST: 13 U/L (ref 10–35)
Albumin: 4.5 g/dL (ref 3.6–5.1)
Alkaline Phosphatase: 69 U/L (ref 33–130)
BUN: 13 mg/dL (ref 7–25)
CO2: 21 mmol/L (ref 20–31)
Calcium: 9.6 mg/dL (ref 8.6–10.4)
Chloride: 106 mmol/L (ref 98–110)
Creat: 0.72 mg/dL (ref 0.50–1.05)
GFR, Est African American: >89 mL/min (ref >=60)
GFR, Est Non African American: >89 mL/min (ref >=60)
GLUCOSE: 98 mg/dL (ref 65–99)
Potassium: 4 mmol/L (ref 3.5–5.3)
SODIUM: 142 mmol/L (ref 135–146)
TOTAL PROTEIN: 7.1 g/dL (ref 6.1–8.1)
Total Bilirubin: 0.4 mg/dL (ref 0.2–1.2)

## 2017-04-28 ENCOUNTER — Telehealth: Payer: Self-pay | Admitting: Family Medicine

## 2017-04-28 ENCOUNTER — Other Ambulatory Visit: Payer: Self-pay | Admitting: Family Medicine

## 2017-04-28 ENCOUNTER — Ambulatory Visit (INDEPENDENT_AMBULATORY_CARE_PROVIDER_SITE_OTHER): Payer: Medicaid Other | Admitting: Family Medicine

## 2017-04-28 VITALS — BP 140/92

## 2017-04-28 DIAGNOSIS — Z8719 Personal history of other diseases of the digestive system: Secondary | ICD-10-CM

## 2017-04-28 DIAGNOSIS — Z719 Counseling, unspecified: Secondary | ICD-10-CM

## 2017-04-28 NOTE — Telephone Encounter (Signed)
Please fax day surgery form, EKG, and CBC, CMP, and Reticulocyte Count to [phone] attention Daylene Katayama.

## 2017-04-28 NOTE — Telephone Encounter (Signed)
Paperwork faxed over to number as requested

## 2017-04-28 NOTE — Progress Notes (Signed)
Referral processed for dental clinic.

## 2017-04-30 ENCOUNTER — Ambulatory Visit (INDEPENDENT_AMBULATORY_CARE_PROVIDER_SITE_OTHER): Payer: Self-pay | Admitting: Podiatry

## 2017-04-30 ENCOUNTER — Encounter: Payer: Self-pay | Admitting: Podiatry

## 2017-04-30 DIAGNOSIS — M722 Plantar fascial fibromatosis: Secondary | ICD-10-CM

## 2017-04-30 DIAGNOSIS — L84 Corns and callosities: Secondary | ICD-10-CM

## 2017-05-03 MED ORDER — BETAMETHASONE SOD PHOS & ACET 6 (3-3) MG/ML IJ SUSP: 3.0000 mg | Freq: Once | INTRAMUSCULAR | Status: AC

## 2017-05-03 NOTE — Progress Notes (Signed)
   HPI:  Patient presents today for follow-up evaluation of plantar fibromas to the bilateral feet. She also complains of painful callus lesion to the left foot. Patient states that the injections helped a small amount. Last visit on 04/02/2017 we discussed surgical intervention regarding fibromas. The patient wants to proceed with surgical intervention. Authorization for surgery as already initiated on last visit.    Physical Exam: General: The patient is alert and oriented x3 in no acute distress.  Dermatology: Skin is warm, dry and supple bilateral lower extremities. Negative for open lesions or macerations.  Vascular: Palpable pedal pulses bilaterally. No edema or erythema noted. Capillary refill within normal limits.  Neurological: Epicritic and protective threshold grossly intact bilaterally.   Musculoskeletal Exam: Painful palpable masses noted to the medial longitudinal arches of the bilateral feet consistent with plantar fascial fibromas. There is exquisite pain on palpation noted more symptomatic on the right than the left.  Assessment: 1. bilateral plantar fibromas 2. Porokeratosis left foot   Plan of Care:  1. Patient was evaluated. 2. Injection of 0.5 mL Celestone Soluspan injected into the left plantar fibroma 3. Excisional debridement of the callus lesion was performed using a chisel blade without incident or bleeding 4. Surgery scheduled for August 30. Surgery will consist of excision of plantar fibroma right foot. 5. Patient states that she has a history of drug abuse. Verbal agreement with the patient today to only dispensed one week of Vicodin 10/325mg postoperatively and an additional week of tramadol 50 mg during the second week of postoperative care 6. Return to clinic 1 week postop   Domanique Luckett M. Indy Kuck, DPM [facility]  Dr. Alcee Sipos M. Kensley Lares, DPM    [address]                                        [place], [place] [address]                Office  [phone]  Fax [phone]      

## 2017-05-15 ENCOUNTER — Telehealth: Payer: Self-pay | Admitting: *Deleted

## 2017-05-15 ENCOUNTER — Encounter (HOSPITAL_COMMUNITY): Payer: Self-pay

## 2017-05-15 ENCOUNTER — Encounter (HOSPITAL_COMMUNITY)
Admission: RE | Admit: 2017-05-15 | Discharge: 2017-05-15 | Disposition: A | Discharge status: Home / Self Care | Payer: Medicaid Other | Source: Ambulatory Visit | Attending: Podiatry | Admitting: Podiatry

## 2017-05-15 DIAGNOSIS — Z01812 Encounter for preprocedural laboratory examination: Secondary | ICD-10-CM | POA: Diagnosis present

## 2017-05-15 DIAGNOSIS — D367 Benign neoplasm of other specified sites: Secondary | ICD-10-CM | POA: Diagnosis not present

## 2017-05-15 HISTORY — DX: Other complications of anesthesia, initial encounter: T88.59XA

## 2017-05-15 HISTORY — DX: Adverse effect of unspecified anesthetic, initial encounter: T41.45XA

## 2017-05-15 HISTORY — DX: Headache: R51

## 2017-05-15 HISTORY — DX: Essential (primary) hypertension: I10

## 2017-05-15 HISTORY — DX: Gastro-esophageal reflux disease without esophagitis: K21.9

## 2017-05-15 HISTORY — DX: Anxiety disorder, unspecified: F41.9

## 2017-05-15 HISTORY — DX: Headache, unspecified: R51.9

## 2017-05-15 LAB — COMPREHENSIVE METABOLIC PANEL
ALBUMIN: 4.4 g/dL (ref 3.5–5.0)
ALK PHOS: 53 U/L (ref 38–126)
ALT: 15 U/L (ref 14–54)
ANION GAP: 8 (ref 5–15)
AST: 23 U/L (ref 15–41)
BUN: 7 mg/dL (ref 6–20)
CHLORIDE: 105 mmol/L (ref 101–111)
CO2: 26 mmol/L (ref 22–32)
Calcium: 10.6 mg/dL — ABNORMAL HIGH (ref 8.9–10.3)
Creatinine, Ser: 0.73 mg/dL (ref 0.44–1.00)
GFR calc Af Amer: >60 mL/min (ref >60)
GFR calc non Af Amer: >60 mL/min (ref >60)
GLUCOSE: 82 mg/dL (ref 65–99)
POTASSIUM: 3.8 mmol/L (ref 3.5–5.1)
SODIUM: 139 mmol/L (ref 135–145)
Total Bilirubin: 0.5 mg/dL (ref 0.3–1.2)
Total Protein: 7.4 g/dL (ref 6.5–8.1)

## 2017-05-15 LAB — CBC
HCT: 39.9 % (ref 36.0–46.0)
HEMOGLOBIN: 13.7 g/dL (ref 12.0–15.0)
MCH: 29.7 pg (ref 26.0–34.0)
MCHC: 34.3 g/dL (ref 30.0–36.0)
MCV: 86.4 fL (ref 78.0–100.0)
Platelets: 286 10*3/uL (ref 150–400)
RBC: 4.62 MIL/uL (ref 3.87–5.11)
RDW: 13.5 % (ref 11.5–15.5)
WBC: 8 10*3/uL (ref 4.0–10.5)

## 2017-05-15 NOTE — Progress Notes (Signed)
PCP - Dr. Lavell Anchors Cardiologist - patient denies  EKG - 05/09/2017 Stress Test - patient denies ECHO - patient denies Cardiac Cath - patient denies  Sleep Study - patient denies   Patient denies shortness of breath, fever, cough and chest pain at PAT appointment   Patient verbalized understanding of instructions that were given to them at the PAT appointment. Patient was also instructed that they will need to review over the PAT instructions again at home before surgery.

## 2017-05-15 NOTE — Telephone Encounter (Signed)
"  I am scheduled to see the patient today to do pre-admit for surgery that is scheduled for August 30.  She's a patient of Dr. Amalia Hailey.  I do not have orders.  I need orders so we can get her pre-admitted."

## 2017-05-15 NOTE — Pre-Procedure Instructions (Signed)
[place]  05/15/2017      [place], [place] [address] [place] [place] [address] Phone: [phone] Fax: [place], [place] [address] [place] [address] Phone: [phone] Fax: [phone]    Your procedure is scheduled on Thursday, May 22, 2017.  Report to [facility] Admitting at 1:00 P.M.  Call this number if you have problems the morning of surgery:  [phone]   Remember:  Do not eat food or drink liquids after midnight.  Take these medicines the morning of surgery with A SIP OF WATER: Tylenol PM Buspirone (Buspar) Gabapentin Nifedipine (Procardia XL) Terbinafine (Lamisil) Ocean Nasal Spray - if needed  7 days prior to surgery STOP taking any Aspirin, Aleve, Naproxen, Ibuprofen, Motrin, Advil, Goody's, BC's, all herbal medications, fish oil, and all vitamins     Do not wear jewelry, make-up or nail polish.  Do not wear lotions, powders, or perfumes, or deodorant.  Do not shave 48 hours prior to surgery.   Do not bring valuables to the hospital.  [facility] is not responsible for any belongings or valuables.  Contacts, dentures or bridgework may not be worn into surgery.  Leave your suitcase in the car.  After surgery it may be brought to your room.  For patients admitted to the hospital, discharge time will be determined by your treatment team.  Patients discharged the day of surgery will not be allowed to drive home.   Name and phone number of your driver:    Special instructions:   [place]- Preparing For Surgery  Before surgery, you can play an important role. Because skin is not sterile, your skin needs to be as free of germs as possible. You can reduce the number of germs on your skin by washing with CHG (chlorahexidine gluconate) Soap before surgery.  CHG is an antiseptic cleaner which kills germs  and bonds with the skin to continue killing germs even after washing.  Please do not use if you have an allergy to CHG or antibacterial soaps. If your skin becomes reddened/irritated stop using the CHG.  Do not shave (including legs and underarms) for at least 48 hours prior to first CHG shower. It is OK to shave your face.  Please follow these instructions carefully.   1. Shower the NIGHT BEFORE SURGERY and the MORNING OF SURGERY with CHG.   2. If you chose to wash your hair, wash your hair first as usual with your normal shampoo.  3. After you shampoo, rinse your hair and body thoroughly to remove the shampoo.  4. Use CHG as you would any other liquid soap. You can apply CHG directly to the skin and wash gently with a scrungie or a clean washcloth.   5. Apply the CHG Soap to your body ONLY FROM THE NECK DOWN.  Do not use on open wounds or open sores. Avoid contact with your eyes, ears, mouth and genitals (private parts). Wash genitals (private parts) with your normal soap.  6. Wash thoroughly, paying special attention to the area where your surgery will be performed.  7. Thoroughly rinse your body with warm water from the neck down.  8. DO NOT shower/wash with your normal soap after using and rinsing off the CHG Soap.  9. Pat yourself dry with a CLEAN TOWEL.   10. Wear CLEAN PAJAMAS   11. Place CLEAN  SHEETS on your bed the night of your first shower and DO NOT SLEEP WITH PETS.    Day of Surgery: Shower as stated above. Do not apply any deodorants/lotions.  Please wear clean clothes to the hospital/surgery center.      Please read over the following fact sheets that you were given. Pain Booklet, Coughing and Deep Breathing and Surgical Site Infection Prevention

## 2017-05-16 MED FILL — GABAPENTIN 300 MG CAPSULE: 300 | 30 days supply | Qty: 90 | Fill #1

## 2017-05-16 MED FILL — NIFEDIPINE ER 30 MG TABLET: 30 | 30 days supply | Qty: 30 | Fill #1

## 2017-05-16 MED FILL — busPIRone HCL 5 MG TABS: 5 | 30 days supply | Qty: 90 | Fill #1

## 2017-05-16 MED FILL — HYDROCHLOROTHIAZIDE 25 MG T: 25 | 30 days supply | Qty: 30 | Fill #3

## 2017-05-19 NOTE — Telephone Encounter (Signed)
This is the [employer] patient I believe that we originally scheduled and then ran into issues. You should have the original surgical papers, if we no longer have them she will need to come in briefly to sign new ones.  Thanks, Dr. Amalia Hailey

## 2017-05-22 ENCOUNTER — Ambulatory Visit (HOSPITAL_COMMUNITY): Payer: Medicaid Other | Admitting: Anesthesiology

## 2017-05-22 ENCOUNTER — Encounter (HOSPITAL_COMMUNITY): Payer: Self-pay

## 2017-05-22 ENCOUNTER — Encounter (HOSPITAL_COMMUNITY)
Admission: RE | Disposition: A | Discharge status: Home / Self Care | Payer: Self-pay | Source: Ambulatory Visit | Attending: Podiatry

## 2017-05-22 ENCOUNTER — Ambulatory Visit (HOSPITAL_COMMUNITY)
Admission: RE | Admit: 2017-05-22 | Discharge: 2017-05-22 | Disposition: A | Discharge status: Home / Self Care | Payer: Medicaid Other | Source: Ambulatory Visit | Attending: Podiatry | Admitting: Podiatry

## 2017-05-22 ENCOUNTER — Telehealth: Payer: Self-pay | Admitting: *Deleted

## 2017-05-22 ENCOUNTER — Ambulatory Visit (HOSPITAL_COMMUNITY): Payer: Medicaid Other | Admitting: Emergency Medicine

## 2017-05-22 DIAGNOSIS — D2121 Benign neoplasm of connective and other soft tissue of right lower limb, including hip: Secondary | ICD-10-CM | POA: Insufficient documentation

## 2017-05-22 DIAGNOSIS — I10 Essential (primary) hypertension: Secondary | ICD-10-CM | POA: Insufficient documentation

## 2017-05-22 DIAGNOSIS — Z79899 Other long term (current) drug therapy: Secondary | ICD-10-CM | POA: Insufficient documentation

## 2017-05-22 DIAGNOSIS — Q828 Other specified congenital malformations of skin: Secondary | ICD-10-CM | POA: Diagnosis not present

## 2017-05-22 DIAGNOSIS — Z886 Allergy status to analgesic agent status: Secondary | ICD-10-CM | POA: Insufficient documentation

## 2017-05-22 DIAGNOSIS — F17200 Nicotine dependence, unspecified, uncomplicated: Secondary | ICD-10-CM | POA: Diagnosis not present

## 2017-05-22 DIAGNOSIS — F1290 Cannabis use, unspecified, uncomplicated: Secondary | ICD-10-CM | POA: Insufficient documentation

## 2017-05-22 DIAGNOSIS — Z888 Allergy status to other drugs, medicaments and biological substances status: Secondary | ICD-10-CM | POA: Insufficient documentation

## 2017-05-22 DIAGNOSIS — Z880 Allergy status to penicillin: Secondary | ICD-10-CM | POA: Insufficient documentation

## 2017-05-22 DIAGNOSIS — F419 Anxiety disorder, unspecified: Secondary | ICD-10-CM | POA: Diagnosis not present

## 2017-05-22 DIAGNOSIS — L84 Corns and callosities: Secondary | ICD-10-CM | POA: Diagnosis not present

## 2017-05-22 DIAGNOSIS — K219 Gastro-esophageal reflux disease without esophagitis: Secondary | ICD-10-CM | POA: Insufficient documentation

## 2017-05-22 DIAGNOSIS — M67471 Ganglion, right ankle and foot: Secondary | ICD-10-CM

## 2017-05-22 HISTORY — PX: PLANTAR FASCIA RELEASE: SHX2239

## 2017-05-22 SURGERY — RELEASE, FASCIA, PLANTAR
Anesthesia: Monitor Anesthesia Care | Site: Foot | Laterality: Right

## 2017-05-22 MED ORDER — SODIUM CHLORIDE 0.9 % IR SOLN
Status: DC | PRN
Start: 2017-05-22 — End: 2017-05-22
  Administered 2017-05-22: 1000 mL

## 2017-05-22 MED ORDER — PROPOFOL 10 MG/ML IV BOLUS
INTRAVENOUS | Status: AC
  Filled 2017-05-22: qty 20

## 2017-05-22 MED ORDER — FENTANYL CITRATE (PF) 100 MCG/2ML IJ SOLN: 100.0000 ug | Freq: Once | INTRAMUSCULAR | Status: DC

## 2017-05-22 MED ORDER — LIDOCAINE 2% (20 MG/ML) 5 ML SYRINGE
INTRAMUSCULAR | Status: AC
  Filled 2017-05-22: qty 5

## 2017-05-22 MED ORDER — MIDAZOLAM HCL 2 MG/2ML IJ SOLN: 2.0000 mg | Freq: Once | INTRAMUSCULAR | Status: DC

## 2017-05-22 MED ORDER — CHLORHEXIDINE GLUCONATE 4 % EX LIQD: 60.0000 mL | Freq: Once | CUTANEOUS | Status: DC

## 2017-05-22 MED ORDER — FENTANYL CITRATE (PF) 250 MCG/5ML IJ SOLN
INTRAMUSCULAR | Status: AC
  Filled 2017-05-22: qty 5

## 2017-05-22 MED ORDER — CLINDAMYCIN PHOSPHATE 900 MG/50ML IV SOLN
900.0000 mg | INTRAVENOUS | Status: AC
  Administered 2017-05-22: 900 mg via INTRAVENOUS
  Filled 2017-05-22: qty 50

## 2017-05-22 MED ORDER — LACTATED RINGERS IV SOLN: INTRAVENOUS | Status: DC

## 2017-05-22 MED ORDER — PROPOFOL 10 MG/ML IV BOLUS
INTRAVENOUS | Status: DC | PRN
  Administered 2017-05-22: 50 mg via INTRAVENOUS

## 2017-05-22 MED ORDER — HYDROCODONE-ACETAMINOPHEN 10-300 MG PO TABS: 1.0000 | ORAL_TABLET | Freq: Four times a day (QID) | ORAL | 0 refills | Status: DC | PRN

## 2017-05-22 MED ORDER — MIDAZOLAM HCL 5 MG/5ML IJ SOLN
INTRAMUSCULAR | Status: DC | PRN
  Administered 2017-05-22: 2 mg via INTRAVENOUS

## 2017-05-22 MED ORDER — FENTANYL CITRATE (PF) 100 MCG/2ML IJ SOLN
INTRAMUSCULAR | Status: DC | PRN
  Administered 2017-05-22: 100 ug via INTRAVENOUS
  Administered 2017-05-22: 50 ug via INTRAVENOUS

## 2017-05-22 MED ORDER — MIDAZOLAM HCL 2 MG/2ML IJ SOLN
INTRAMUSCULAR | Status: AC
  Filled 2017-05-22: qty 2

## 2017-05-22 MED ORDER — LACTATED RINGERS IV SOLN
INTRAVENOUS | Status: DC
  Administered 2017-05-22: 14:00:00 via INTRAVENOUS

## 2017-05-22 MED ORDER — PROPOFOL 500 MG/50ML IV EMUL
INTRAVENOUS | Status: DC | PRN
  Administered 2017-05-22: 100 ug/kg/min via INTRAVENOUS

## 2017-05-22 SURGICAL SUPPLY — 53 items
BANDAGE ACE 4X5 VEL STRL LF (GAUZE/BANDAGES/DRESSINGS) ×3 IMPLANT
BLADE SAW SGTL 83.5X18.5 (BLADE) ×3 IMPLANT
BLADE SURG 15 STRL LF DISP TIS (BLADE) ×3 IMPLANT
BLADE SURG 15 STRL SS (BLADE) ×6
BNDG COHESIVE 6X5 TAN STRL LF (GAUZE/BANDAGES/DRESSINGS) IMPLANT
BNDG ESMARK 4X9 LF (GAUZE/BANDAGES/DRESSINGS) ×6 IMPLANT
BNDG GAUZE ELAST 4 BULKY (GAUZE/BANDAGES/DRESSINGS) ×3 IMPLANT
BOWL SMART MIX CTS (DISPOSABLE) IMPLANT
CHLORAPREP W/TINT 26ML (MISCELLANEOUS) ×3 IMPLANT
CONT SPEC 4OZ CLIKSEAL STRL BL (MISCELLANEOUS) ×3 IMPLANT
COVER SURGICAL LIGHT HANDLE (MISCELLANEOUS) ×3 IMPLANT
CUFF TOURNIQUET SINGLE 18IN (TOURNIQUET CUFF) ×6 IMPLANT
CUFF TOURNIQUET SINGLE 34IN LL (TOURNIQUET CUFF) ×3 IMPLANT
DRAPE C-ARM MINI 42X72 WSTRAPS (DRAPES) ×3 IMPLANT
DRAPE U-SHAPE 47X51 STRL (DRAPES) ×3 IMPLANT
DRSG PAD ABDOMINAL 8X10 ST (GAUZE/BANDAGES/DRESSINGS) ×3 IMPLANT
ELECT CAUTERY BLADE 6.4 (BLADE) ×3 IMPLANT
ELECT REM PT RETURN 9FT ADLT (ELECTROSURGICAL)
ELECTRODE REM PT RTRN 9FT ADLT (ELECTROSURGICAL) IMPLANT
GAUZE SPONGE 4X4 12PLY STRL (GAUZE/BANDAGES/DRESSINGS) ×3 IMPLANT
GAUZE SPONGE 4X4 12PLY STRL LF (GAUZE/BANDAGES/DRESSINGS) ×3 IMPLANT
GAUZE XEROFORM 1X8 LF (GAUZE/BANDAGES/DRESSINGS) ×3 IMPLANT
GLOVE BIO SURGEON STRL SZ8 (GLOVE) ×3 IMPLANT
GLOVE BIOGEL PI IND STRL 8 (GLOVE) ×1 IMPLANT
GLOVE BIOGEL PI INDICATOR 8 (GLOVE) ×2
GOWN STRL REUS W/ TWL LRG LVL3 (GOWN DISPOSABLE) ×2 IMPLANT
GOWN STRL REUS W/TWL LRG LVL3 (GOWN DISPOSABLE) ×4
HANDPIECE INTERPULSE COAX TIP (DISPOSABLE)
KIT BASIN OR (CUSTOM PROCEDURE TRAY) ×3 IMPLANT
KIT ROOM TURNOVER OR (KITS) ×3 IMPLANT
MANIFOLD NEPTUNE II (INSTRUMENTS) ×3 IMPLANT
NEEDLE HYPO 25GX1X1/2 BEV (NEEDLE) ×3 IMPLANT
NS IRRIG 1000ML POUR BTL (IV SOLUTION) ×3 IMPLANT
PACK ORTHO EXTREMITY (CUSTOM PROCEDURE TRAY) ×3 IMPLANT
PAD ARMBOARD 7.5X6 YLW CONV (MISCELLANEOUS) ×6 IMPLANT
PAD CAST 4YDX4 CTTN HI CHSV (CAST SUPPLIES) ×1 IMPLANT
PADDING CAST COTTON 4X4 STRL (CAST SUPPLIES) ×2
PADDING CAST COTTON 6X4 STRL (CAST SUPPLIES) ×3 IMPLANT
SCRUB BETADINE 4OZ XXX (MISCELLANEOUS) ×3 IMPLANT
SET HNDPC FAN SPRY TIP SCT (DISPOSABLE) IMPLANT
SOL PREP POV-IOD 4OZ 10% (MISCELLANEOUS) ×3 IMPLANT
STAPLER VISISTAT 35W (STAPLE) ×3 IMPLANT
STOCKINETTE IMPERVIOUS 9X36 MD (GAUZE/BANDAGES/DRESSINGS) ×3 IMPLANT
SUT PROLENE 3 0 PS 2 (SUTURE) ×6 IMPLANT
SUT VIC AB 4-0 PS2 27 (SUTURE) ×3 IMPLANT
SWAB CULTURE LIQ STUART DBL (MISCELLANEOUS) ×3 IMPLANT
SWAB CULTURE LIQUID MINI MALE (MISCELLANEOUS) ×3 IMPLANT
SYR CONTROL 10ML LL (SYRINGE) ×3 IMPLANT
TOWEL OR 17X24 6PK STRL BLUE (TOWEL DISPOSABLE) ×3 IMPLANT
TOWEL OR 17X26 10 PK STRL BLUE (TOWEL DISPOSABLE) ×3 IMPLANT
TUBE CONNECTING 12'X1/4 (SUCTIONS) ×1
TUBE CONNECTING 12X1/4 (SUCTIONS) ×2 IMPLANT
YANKAUER SUCT BULB TIP NO VENT (SUCTIONS) ×3 IMPLANT

## 2017-05-22 NOTE — Interval H&P Note (Signed)
History and Physical Interval Note:  05/22/2017 3:04 PM  Joy Barber  has presented today for surgery, with the diagnosis of plantar fibroma right  The various methods of treatment have been discussed with the patient and family. After consideration of risks, benefits and other options for treatment, the patient has consented to  Procedure(s): excision PLANTAR fibromas (Right) as a surgical intervention .  The patient's history has been reviewed, patient examined, no change in status, stable for surgery.  I have reviewed the patient's chart and labs.  Questions were answered to the patient's satisfaction.     Edrick Kins

## 2017-05-22 NOTE — Anesthesia Procedure Notes (Signed)
Procedure Name: MAC Date/Time: 05/22/2017 2:33 PM Performed by: Kyung Rudd Pre-anesthesia Checklist: Patient identified, Emergency Drugs available, Suction available and Patient being monitored Patient Re-evaluated:Patient Re-evaluated prior to induction Oxygen Delivery Method: Simple face mask Induction Type: IV induction Placement Confirmation: positive ETCO2 Dental Injury: Teeth and Oropharynx as per pre-operative assessment

## 2017-05-22 NOTE — Transfer of Care (Signed)
Immediate Anesthesia Transfer of Care Note  Patient: AARYANA BETKE  Procedure(s) Performed: Procedure(s): excision PLANTAR fibromas (Right)  Patient Location: PACU  Anesthesia Type:MAC  Level of Consciousness: awake, alert  and oriented  Airway & Oxygen Therapy: Patient Spontanous Breathing and Patient connected to nasal cannula oxygen  Post-op Assessment: Report given to RN, Post -op Vital signs reviewed and stable and Patient moving all extremities X 4  Post vital signs: Reviewed and stable  Last Vitals:  Vitals:   05/22/17 1324 05/22/17 1341  BP:  (!) 176/79  Pulse: 73   Resp: 16   Temp: 36.9 C   SpO2: 100%     Last Pain:  Vitals:   05/22/17 1324  TempSrc: Oral  PainSc: 7       Patients Stated Pain Goal: 3 (81/82/99 3716)  Complications: No apparent anesthesia complications

## 2017-05-22 NOTE — H&P (View-Only) (Signed)
   HPI:  Patient presents today for follow-up evaluation of plantar fibromas to the bilateral feet. She also complains of painful callus lesion to the left foot. Patient states that the injections helped a small amount. Last visit on 04/02/2017 we discussed surgical intervention regarding fibromas. The patient wants to proceed with surgical intervention. Authorization for surgery as already initiated on last visit.    Physical Exam: General: The patient is alert and oriented x3 in no acute distress.  Dermatology: Skin is warm, dry and supple bilateral lower extremities. Negative for open lesions or macerations.  Vascular: Palpable pedal pulses bilaterally. No edema or erythema noted. Capillary refill within normal limits.  Neurological: Epicritic and protective threshold grossly intact bilaterally.   Musculoskeletal Exam: Painful palpable masses noted to the medial longitudinal arches of the bilateral feet consistent with plantar fascial fibromas. There is exquisite pain on palpation noted more symptomatic on the right than the left.  Assessment: 1. bilateral plantar fibromas 2. Porokeratosis left foot   Plan of Care:  1. Patient was evaluated. 2. Injection of 0.5 mL Celestone Soluspan injected into the left plantar fibroma 3. Excisional debridement of the callus lesion was performed using a chisel blade without incident or bleeding 4. Surgery scheduled for August 30. Surgery will consist of excision of plantar fibroma right foot. 5. Patient states that she has a history of drug abuse. Verbal agreement with the patient today to only dispensed one week of Vicodin 10/325mg  postoperatively and an additional week of tramadol 50 mg during the second week of postoperative care 6. Return to clinic 1 week postop   Edrick Kins, DPM [facility]  Dr. Edrick Kins, DPM    [address], [place] [address]                Office  [phone]  Fax [phone]

## 2017-05-22 NOTE — Brief Op Note (Signed)
05/22/2017  4:34 PM  PATIENT:  Joy Barber  56 y.o. female  PRE-OPERATIVE DIAGNOSIS:  plantar fibroma right  POST-OPERATIVE DIAGNOSIS:  plantar fibroma right  PROCEDURE:  Procedure(s): excision PLANTAR fibromas (Right)  SURGEON:  Surgeon(s) and Role:    Edrick Kins, DPM - Primary  PHYSICIAN ASSISTANT:   ASSISTANTS: none   ANESTHESIA:   regional and IV sedation  EBL:  Total I/O In: 700 [I.V.:700] Out: 5 [Blood:5]  BLOOD ADMINISTERED:none  DRAINS: none   LOCAL MEDICATIONS USED:  none   SPECIMEN:  Source of Specimen:  plantar fibroma right foot  DISPOSITION OF SPECIMEN:  PATHOLOGY  COUNTS:  YES  TOURNIQUET:   Total Tourniquet Time Documented: Calf (Right) - 32 minutes Total: Calf (Right) - 32 minutes   DICTATION: .Viviann Spare Dictation  PLAN OF CARE: Discharge to home after PACU  PATIENT DISPOSITION:  PACU - hemodynamically stable.   Delay start of Pharmacological VTE agent (>24hrs) due to surgical blood loss or risk of bleeding: not applicable  Edrick Kins, DPM [facility]  Dr. Edrick Kins, DPM    [address] [place], [place] [address]                Office [phone]  Fax [phone]

## 2017-05-22 NOTE — Telephone Encounter (Signed)
"  I just want to make sure that you have the right chart number for me.  Apparently there's another person with my name.  I normally say Joy Barber when I call you.  I saw in MyChart that I was scheduled to have surgery on both feet.  I don't want surgery on both feet right now.  I'm only supposed to have it on my right foot today.  Can you let me know what you see in my chart?  Will I be put to sleep?  I don't want to see anything or hear anything.  I have anxiety going on about this procedure.  He said that place was attached to my tendon.  Will it cause problems removing it?  Will I be shaking and grinding afterwards, I like to dance?"    I called out the chart number.  I assured her we only had her scheduled for surgery for the right foot.  I told her she would be put to sleep.  I reassured her that Dr. Amalia Hailey would take care of her and that she would be able to dance all she wanted as soon as she healed.

## 2017-05-22 NOTE — Op Note (Signed)
OPERATIVE REPORT Patient name: Joy Barber MRN: [id number] DOB: 1961-06-05  DOS:  05/22/2017   Preop Dx: Plantar fibroma right foot Postop Dx: same  Procedure:  1. Excision of plantar fibroma/soft tissue mass right foot  Surgeon: Edrick Kins DPM  Anesthesia: Monitored Anesthesia Care in combination with Popliteal Block  Hemostasis: Ankle tourniquet inflated to a pressure of 280mmHg after esmarch exsanguination   EBL: 10 mL Materials: None Injectables: none Pathology: Plantar fibroma right foot  Condition: The patient tolerated the procedure and anesthesia well. No complications noted or reported   Justification for procedure: The patient is a 56 y.o. Female who presents today for surgical correction of symptomatic plantar fibroma to the right foot. All conservative modalities of been unsuccessful in providing any sort of satisfactory alleviation of symptoms with the patient. The patient was told benefits as well as possible side effects of the surgery. The patient consented for surgical correction. The patient consent form was reviewed. All patient questions were answered. No guarantees were expressed or implied. The patient and the surgeon boson the patient consent form with the witness present and placed in the patient's chart.   Procedure in Detail: The patient was brought to the operating room, placed in the operating table in the supine position at which time an aseptic scrub and drape were performed about the patient's respective lower extremity after anesthesia was induced as described above. Attention was then directed to the surgical area where procedure number one commenced.  Procedure #1: Excision of plantar fibroma right foot A 6cm v-type incision was planned and made bout the plantar aspect of the right forefoot overlying the plantar fibroma. The apex of the V-type incision was on the lateral aspect. Incision was carried down to level of subcutaneous tissue with  care taken to cut clamped ligated or retracted way all small neurovascular structures traversing the incision site. A full-thickness flap of skin was reflected away beginning at the apex of the V using sharp dissection with a surgical #15 blade. This exposed the underlying fibroma. Curved Metzenbaum scissors were then utilized to reflect away the plantar fibroma from the surrounding soft tissue and plantar fascia. The plantar fibroma was removed in toto and placing sterile specimen container and sent to pathology. Intraoperative evaluation was utilized to ensure that all fibromatous material was sharply dissected away. The incision site was then copiously irrigated with normal saline and dried in preparation for routine layered soft tissue closure. 4-0 Vicryl sutures utilized to reapproximate subcutaneous tissue followed by 3-0 Prolene suture to reapproximate superficial skin edges.  Dry sterile compressive dressings were then applied to all previously mentioned incision sites about the patient's lower extremity. The tourniquet which was used for hemostasis was deflated. All normal neurovascular responses including pink color and warmth returned all the digits of patient's lower extremity.  The patient was then transferred from the operating room to the recovery room having tolerated the procedure and anesthesia well. All vital signs are stable. After a brief stay in the recovery room the patient was discharged with adequate prescriptions for analgesia. Verbal as well as written instructions were provided for the patient regarding wound care. The patient is to keep the dressings clean dry and intact until they are to follow surgeon Dr. Daylene Katayama in the office upon discharge.   Edrick Kins, DPM [facility]  Dr. Edrick Kins, DPM    [address]  [place], [place] [address]                Office [phone]  Fax [phone]   

## 2017-05-22 NOTE — Anesthesia Preprocedure Evaluation (Addendum)
Anesthesia Evaluation  Patient identified by MRN, date of birth, ID band Patient awake    Reviewed: Allergy & Precautions, NPO status , Patient's Chart, lab work & pertinent test results  History of Anesthesia Complications Negative for: history of anesthetic complications  Airway Mallampati: II  TM Distance: >3 FB Neck ROM: Full    Dental  (+) Teeth Intact   Pulmonary neg shortness of breath, neg COPD, neg recent URI, Current Smoker,    breath sounds clear to auscultation       Cardiovascular hypertension, Pt. on medications  Rhythm:Regular     Neuro/Psych  Headaches, Seizures -, Well Controlled,  PSYCHIATRIC DISORDERS Anxiety    GI/Hepatic GERD  Controlled,(+)     substance abuse  marijuana use,   Endo/Other  negative endocrine ROS  Renal/GU negative Renal ROS     Musculoskeletal negative musculoskeletal ROS (+)   Abdominal   Peds  Hematology negative hematology ROS (+)   Anesthesia Other Findings   Reproductive/Obstetrics                            Anesthesia Physical Anesthesia Plan  ASA: II  Anesthesia Plan: MAC and Regional   Post-op Pain Management:    Induction: Intravenous  PONV Risk Score and Plan: 1 and Ondansetron and Treatment may vary due to age or medical condition  Airway Management Planned: Nasal Cannula  Additional Equipment: None  Intra-op Plan:   Post-operative Plan:   Informed Consent: I have reviewed the patients History and Physical, chart, labs and discussed the procedure including the risks, benefits and alternatives for the proposed anesthesia with the patient or authorized representative who has indicated his/her understanding and acceptance.   Dental advisory given  Plan Discussed with: CRNA and Surgeon  Anesthesia Plan Comments:        Anesthesia Quick Evaluation

## 2017-05-23 ENCOUNTER — Encounter (HOSPITAL_COMMUNITY): Payer: Self-pay | Admitting: Podiatry

## 2017-05-23 MED ORDER — LIDOCAINE-EPINEPHRINE 2 %-1:100000 IJ SOLN
INTRAMUSCULAR | Status: DC | PRN
  Administered 2017-05-22: 10 mL via PERINEURAL

## 2017-05-23 MED ORDER — ROPIVACAINE HCL 7.5 MG/ML IJ SOLN
INTRAMUSCULAR | Status: DC | PRN
  Administered 2017-05-22: 20 mL via PERINEURAL

## 2017-05-23 NOTE — Anesthesia Postprocedure Evaluation (Signed)
Anesthesia Post Note  Patient: Joy Barber  Procedure(s) Performed: Procedure(s) (LRB): excision PLANTAR fibromas (Right)     Patient location during evaluation: PACU Anesthesia Type: Regional and MAC Level of consciousness: awake and alert Pain management: pain level controlled Vital Signs Assessment: post-procedure vital signs reviewed and stable Respiratory status: spontaneous breathing, nonlabored ventilation, respiratory function stable and patient connected to nasal cannula oxygen Cardiovascular status: stable and blood pressure returned to baseline Postop Assessment: no signs of nausea or vomiting Anesthetic complications: no    Last Vitals:  Vitals:   05/22/17 1636 05/22/17 1644  BP: (!) 122/101   Pulse: 70   Resp: 17   Temp:  36.6 C  SpO2: 97%     Last Pain:  Vitals:   05/22/17 1650  TempSrc:   PainSc: 0-No pain                 Tiaja Hagan

## 2017-05-23 NOTE — Anesthesia Procedure Notes (Signed)
Anesthesia Regional Block: Popliteal block   Pre-Anesthetic Checklist: ,, timeout performed, Correct Patient, Correct Site, Correct Laterality, Correct Procedure, Correct Position, site marked, Risks and benefits discussed,  Surgical consent,  Pre-op evaluation,  At surgeon's request and post-op pain management  Laterality: Lower and Right  Prep: chloraprep       Needles:  Injection technique: Single-shot  Needle Type: Echogenic Stimulator Needle          Additional Needles:   Procedures: ultrasound guided,,,,,,,,  Narrative:  Start time: 05/22/2017 3:11 PM End time: 05/22/2017 3:18 PM Injection made incrementally with aspirations every 5 mL.  Performed by: Personally  Anesthesiologist: Marguita Venning  Additional Notes: H+P and labs reviewed, risks and benefits discussed with patient, procedure tolerated well without complications

## 2017-05-28 ENCOUNTER — Ambulatory Visit (INDEPENDENT_AMBULATORY_CARE_PROVIDER_SITE_OTHER): Payer: Self-pay | Admitting: Podiatry

## 2017-05-28 ENCOUNTER — Encounter: Payer: Self-pay | Admitting: Podiatry

## 2017-05-28 DIAGNOSIS — Z9889 Other specified postprocedural states: Secondary | ICD-10-CM

## 2017-05-28 MED ORDER — HYDROCODONE-ACETAMINOPHEN 10-325 MG PO TABS: 1.0000 | ORAL_TABLET | Freq: Three times a day (TID) | ORAL | 0 refills | Status: DC | PRN

## 2017-06-01 NOTE — Progress Notes (Signed)
   Subjective:  Patient presents today status post excision of plantar fibromas right. DOS: 05/22/17. She states she is experiencing pain and swelling of the right foot. She is requesting a prescription for Tramadol as well. She is here for further evaluation and treatment.    Objective/Physical Exam Skin incisions appear to be well coapted with sutures and staples intact. No sign of infectious process noted. No dehiscence. No active bleeding noted. Moderate edema noted to the surgical extremity.  Radiographic Exam:  Orthopedic hardware and osteotomies sites appear to be stable with routine healing.  Assessment: 1. s/p excision of plantar fibromas right. DOS: 05/22/17.   Plan of Care:  1. Patient was evaluated. X-rays reviewed 2. Dressing changed. 3. Refill prescription for Vicodin given to patient. Tramadol at next visit.  4. Continue wearing post op shoe.  5. Return to clinic in 2 weeks for suture removal.    Edrick Kins, DPM [facility]  Dr. Edrick Kins, [place]                                        [place], [place] [address]                Office [phone]  Fax [phone]

## 2017-06-04 ENCOUNTER — Encounter: Payer: No Typology Code available for payment source | Admitting: Podiatry

## 2017-06-06 ENCOUNTER — Emergency Department (HOSPITAL_COMMUNITY)
Admission: EM | Admit: 2017-06-06 | Discharge: 2017-06-06 | Disposition: A | Discharge status: Home / Self Care | Payer: No Typology Code available for payment source | Attending: Emergency Medicine | Admitting: Emergency Medicine

## 2017-06-06 ENCOUNTER — Encounter (HOSPITAL_COMMUNITY): Payer: Self-pay | Admitting: Family Medicine

## 2017-06-06 DIAGNOSIS — Z5321 Procedure and treatment not carried out due to patient leaving prior to being seen by health care provider: Secondary | ICD-10-CM | POA: Insufficient documentation

## 2017-06-06 DIAGNOSIS — R51 Headache: Secondary | ICD-10-CM | POA: Insufficient documentation

## 2017-06-06 NOTE — ED Triage Notes (Signed)
Pt here for sever HA since this am with vomiting. sts blurred vision.

## 2017-06-11 ENCOUNTER — Ambulatory Visit (INDEPENDENT_AMBULATORY_CARE_PROVIDER_SITE_OTHER): Payer: Self-pay | Admitting: Podiatry

## 2017-06-11 ENCOUNTER — Encounter: Payer: Self-pay | Admitting: Podiatry

## 2017-06-11 DIAGNOSIS — Z9889 Other specified postprocedural states: Secondary | ICD-10-CM

## 2017-06-11 MED ORDER — TRAMADOL HCL 50 MG PO TABS: 50.0000 mg | ORAL_TABLET | Freq: Three times a day (TID) | ORAL | 0 refills | Status: DC | PRN

## 2017-06-11 MED FILL — traMADol HCL 50 MG TABS: 50 | 10 days supply | Qty: 30 | Fill #0

## 2017-06-15 NOTE — Progress Notes (Signed)
   Subjective:  Patient presents today status post excision of plantar fibromas right. DOS: 05/22/17. She reports she is doing well but complains of continued swelling of the right foot. Patient is nervous about having the sutures removed today. She is here for further evaluation and treatment.   Past Medical History:  Diagnosis Date  . ADHD (attention deficit hyperactivity disorder)   . Allergy   . Anxiety   . Complication of anesthesia    "to ether anesthesia when I was 56 years old, woke up with N/V"  . GERD (gastroesophageal reflux disease)   . Head injury    post-concusive syndrome  . Headache   . Hypertension   . OCD (obsessive compulsive disorder)   . PTSD (post-traumatic stress disorder)   . Seizures ([place])    s/p TBI  . Subdural hematoma ([place]) sept 2012      Objective/Physical Exam Skin incisions appear to be well coapted with sutures and staples intact. No sign of infectious process noted. No dehiscence. No active bleeding noted. Moderate edema noted to the surgical extremity.   Assessment: 1. s/p excision of plantar fibromas right. DOS: 05/22/17.   Plan of Care:  1. Patient was evaluated.  2. Sutures removed. 3. Dry sterile dressing applied. 4. Continue nonweightbearing in postop shoe. Note provided. 5. Recommend an antibiotic ointment and Band-Aid daily. 6. Prescription for tramadol 50 mg #30 given to patient. 7. Return to clinic in 3 weeks.   Edrick Kins, DPM [facility]  Dr. Edrick Kins, [place]                                        [place], [place] [address]                Office [phone]  Fax [phone]

## 2017-06-16 ENCOUNTER — Ambulatory Visit: Payer: No Typology Code available for payment source | Attending: Internal Medicine

## 2017-06-16 MED FILL — GABAPENTIN 300 MG CAPSULE: 300 | 30 days supply | Qty: 90 | Fill #0

## 2017-06-16 MED FILL — busPIRone HCL 5 MG TABS: 5 | 30 days supply | Qty: 90 | Fill #2

## 2017-06-16 MED FILL — NIFEDIPINE ER 30 MG TABLET: 30 | 30 days supply | Qty: 30 | Fill #2

## 2017-06-16 MED FILL — TERBINAFINE HCL 250 MG TAB: 250 | 30 days supply | Qty: 30 | Fill #2

## 2017-06-16 MED FILL — HYDROCHLOROTHIAZIDE 25 MG T: 25 | 30 days supply | Qty: 30 | Fill #4

## 2017-06-19 ENCOUNTER — Ambulatory Visit: Payer: No Typology Code available for payment source

## 2017-06-19 DIAGNOSIS — M722 Plantar fascial fibromatosis: Secondary | ICD-10-CM

## 2017-06-19 DIAGNOSIS — Z9889 Other specified postprocedural states: Secondary | ICD-10-CM

## 2017-06-19 MED ORDER — TRAMADOL HCL 50 MG PO TABS: 50.0000 mg | ORAL_TABLET | Freq: Three times a day (TID) | ORAL | 0 refills | Status: DC | PRN

## 2017-06-19 MED FILL — traMADol HCL 50 MG TABS: 50 | 10 days supply | Qty: 30 | Fill #0

## 2017-06-23 NOTE — Progress Notes (Signed)
DOS - 05/22/2017 Excision Plantar Fibroma right foot

## 2017-06-24 ENCOUNTER — Telehealth: Payer: Self-pay | Admitting: Podiatry

## 2017-06-24 NOTE — Progress Notes (Signed)
Patient presents stating that she is unable to change her bandage. She denies pain at this time   Soiled bandage removed, noted well healing plantar surgical incision. No gapping, drainage or s/s of infection.  Dry sterile bandage applied, appointed to keep current post op appt

## 2017-06-24 NOTE — Telephone Encounter (Addendum)
Pt called and stated she is not going to touch her foot or get it wet this week. States she is not going to stand on it at all. It feels funky where the last few stitches are. I just wanted to let the nurse Janett Billow know that I do not want to touch it or get it wet until I come in for my follow up appointment next week which is Wednesday 10 October. If she needs to reach me, she can call me at [phone] and leave a message. My phone got messed up when [facility] came through and I cannot answer but I can see when somebody calls and I can check the messages from a land line phone.

## 2017-06-25 ENCOUNTER — Encounter: Payer: Self-pay | Admitting: Neurology

## 2017-06-25 ENCOUNTER — Ambulatory Visit (INDEPENDENT_AMBULATORY_CARE_PROVIDER_SITE_OTHER): Payer: Self-pay | Admitting: Neurology

## 2017-06-25 VITALS — BP 152/92 | HR 94 | Ht 64.5 in | Wt 135.8 lb

## 2017-06-25 DIAGNOSIS — Z87898 Personal history of other specified conditions: Secondary | ICD-10-CM

## 2017-06-25 DIAGNOSIS — I10 Essential (primary) hypertension: Secondary | ICD-10-CM

## 2017-06-25 DIAGNOSIS — R413 Other amnesia: Secondary | ICD-10-CM

## 2017-06-25 DIAGNOSIS — G44329 Chronic post-traumatic headache, not intractable: Secondary | ICD-10-CM

## 2017-06-25 DIAGNOSIS — S069X9D Unspecified intracranial injury with loss of consciousness of unspecified duration, subsequent encounter: Secondary | ICD-10-CM

## 2017-06-25 DIAGNOSIS — F29 Unspecified psychosis not due to a substance or known physiological condition: Secondary | ICD-10-CM

## 2017-06-25 DIAGNOSIS — F4310 Post-traumatic stress disorder, unspecified: Secondary | ICD-10-CM

## 2017-06-25 DIAGNOSIS — F901 Attention-deficit hyperactivity disorder, predominantly hyperactive type: Secondary | ICD-10-CM

## 2017-06-25 NOTE — Patient Instructions (Addendum)
1.  Neurocognitive testing 2.  EEG  3.  Follow up pending results
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
## 2017-06-30 ENCOUNTER — Other Ambulatory Visit: Payer: No Typology Code available for payment source

## 2017-07-02 ENCOUNTER — Ambulatory Visit (INDEPENDENT_AMBULATORY_CARE_PROVIDER_SITE_OTHER): Payer: Self-pay | Admitting: Podiatry

## 2017-07-02 ENCOUNTER — Encounter: Payer: No Typology Code available for payment source | Admitting: Podiatry

## 2017-07-02 ENCOUNTER — Telehealth: Payer: Self-pay | Admitting: Podiatry

## 2017-07-02 DIAGNOSIS — Z9889 Other specified postprocedural states: Secondary | ICD-10-CM

## 2017-07-02 MED ORDER — TRAMADOL HCL 50 MG PO TABS: 50.0000 mg | ORAL_TABLET | Freq: Three times a day (TID) | ORAL | 0 refills | Status: DC | PRN

## 2017-07-02 MED ORDER — SULFAMETHOXAZOLE-TRIMETHOPRIM 800-160 MG PO TABS: 1.0000 | ORAL_TABLET | Freq: Two times a day (BID) | ORAL | 0 refills | Status: DC

## 2017-07-02 MED ORDER — GENTAMICIN SULFATE 0.1 % EX CREA: 1.0000 "application " | TOPICAL_CREAM | Freq: Three times a day (TID) | CUTANEOUS | 1 refills | Status: DC

## 2017-07-02 MED FILL — traMADol HCL 50 MG TABS: 50 | 10 days supply | Qty: 30 | Fill #0

## 2017-07-02 MED FILL — SULFAMETHOXAZOLE-TMP DS TAB: 800-160 | 10 days supply | Qty: 20 | Fill #0

## 2017-07-02 NOTE — Patient Instructions (Signed)
Antibiotic cream and bandaid daily Take oral antibiotic as directed.

## 2017-07-02 NOTE — Telephone Encounter (Signed)
This is Deetta Perla calling. I am calling with questions about the cream for my foot. They won't have it until tomorrow. So I don't know if you want me to wait and/or leave it like it is until I get the cream? Also, should I eat or not eat with the antibiotic. Phone number is [phone]. Thank you. Bye.

## 2017-07-02 NOTE — Progress Notes (Signed)
   Subjective:  Patient presents today status post excision of plantar fibromas right. DOS: 05/22/17. She reports she is doing well but complains of continued swelling of the right foot. Patient also states that she is unable to walk on the foot due to pain and nervousness. She presents today for further treatment and evaluation  Past Medical History:  Diagnosis Date  . ADHD (attention deficit hyperactivity disorder)   . Allergy   . Anxiety   . Complication of anesthesia    "to ether anesthesia when I was 56 years old, woke up with N/V"  . GERD (gastroesophageal reflux disease)   . Head injury    post-concusive syndrome  . Headache   . Hypertension   . OCD (obsessive compulsive disorder)   . PTSD (post-traumatic stress disorder)   . Seizures ([place])    s/p TBI  . Subdural hematoma ([place]) sept 2012      Objective/Physical Exam Skin incisions appear to be well coapted. There is a small punctate area of dehiscence noted to the plantar incision site measuring approximately 0.30.3 cm. Upon expression of the surgical site there was a large coagulant hematoma expressed from the small dehiscence area. All hematoma was expressed and the lesion was dressed with antibiotic ointment and dry sterile dressing. Negative for any periwound erythema. No malodor noted.  Assessment: 1. s/p excision of plantar fibromas right. DOS: 05/22/17. 2. Postoperative hematoma right foot   Plan of Care:  1. Patient was evaluated.  2. Hematoma was expressed from the surgical site. Dry sterile dressing applied 3. Prescription for gentamicin cream to be applied daily 4. Prescription for Augmentin for prophylaxis 5. Prescription for tramadol 50 mg 6. Return to clinic in 2 weeks  Edrick Kins, DPM [facility]  Dr. Edrick Kins, [address]                                        [place], [place] [address]                Office [phone]  Fax [phone]

## 2017-07-04 ENCOUNTER — Ambulatory Visit: Payer: No Typology Code available for payment source | Admitting: Family Medicine

## 2017-07-04 NOTE — Telephone Encounter (Signed)
Left message informing pt, it would be okay to leave dressing in place until the cream was received, and that it would be a good idea to take the antibiotic with a little of food.

## 2017-07-07 ENCOUNTER — Telehealth: Payer: Self-pay | Admitting: *Deleted

## 2017-07-07 ENCOUNTER — Other Ambulatory Visit: Payer: No Typology Code available for payment source

## 2017-07-07 NOTE — Telephone Encounter (Addendum)
Pt states she got my message and is having stabbing pain when walking, doesn't need anything else for pain, area just feels raw and she will be at her appt on 07/09/2017.07/11/2017-Pt states her dressing is tight at the big toe, she has tugged it loose to see and the area is macerated, pt states she only wants to see a nurse, not an Environmental consultant and wants a nurse in the room at her visit. Pt states she has pain medication and doesn't want anything.

## 2017-07-08 ENCOUNTER — Telehealth: Payer: Self-pay | Admitting: *Deleted

## 2017-07-08 NOTE — Telephone Encounter (Signed)
Pt states her paper work from 07/02/2017 states she has plantar fibroma but the other paper work for surgery says she has  Ganglion, is this why it is taking so long to heal. Dr. Amalia Hailey pushed a big blood clot out of her foot and said she had a hole in her foot and she stated she is freaking out. Pt states she was told to do daily dressing changes but she is not going to do that she isn't going to touch it until she sees Dr. Amalia Hailey on 07/16/2017. I asked pt if she would like to have the nurse change her dressing and she said Dr. Amalia Hailey said she could do it. Left message informing Dr. Amalia Hailey that pt had not changed her dressing daily as instructed and I was getting her scheduled to see Gretta Arab, RN this week for dressing changes. I informed pt and she states she would like to be seen on 07/10/2017, I told her to inform the schedulers of her request and to tell them I had wanted you scheduled. I transferred pt to scheduler - A. Smith.

## 2017-07-09 ENCOUNTER — Other Ambulatory Visit: Payer: Self-pay | Admitting: Family Medicine

## 2017-07-09 ENCOUNTER — Ambulatory Visit: Payer: No Typology Code available for payment source | Admitting: Podiatry

## 2017-07-09 MED FILL — HYDROCHLOROTHIAZIDE 25 MG T: 25 | 30 days supply | Qty: 30 | Fill #5

## 2017-07-09 MED FILL — busPIRone HCL 5 MG TABS: 5 | 13 days supply | Qty: 39 | Fill #3

## 2017-07-10 ENCOUNTER — Ambulatory Visit: Payer: No Typology Code available for payment source

## 2017-07-10 DIAGNOSIS — Z9889 Other specified postprocedural states: Secondary | ICD-10-CM

## 2017-07-10 DIAGNOSIS — M722 Plantar fascial fibromatosis: Secondary | ICD-10-CM

## 2017-07-10 MED FILL — GENTAMICIN 0.1% CREAM: 0.1 | 15 days supply | Qty: 30 | Fill #0

## 2017-07-10 NOTE — Progress Notes (Signed)
Patient presents s/p Rt foot surgery, stating that she would like her dressing changed.    Soiled dressing removed, noted mild maceration on plantar foot. Painted with betadine and applied dry sterile gauze with coban. Re-appointed as needed on nurse schedule for dressing change. She is to keep her f/u appt with Dr Amalia Hailey

## 2017-07-14 ENCOUNTER — Ambulatory Visit: Payer: No Typology Code available for payment source | Admitting: Family Medicine

## 2017-07-15 ENCOUNTER — Telehealth: Payer: Self-pay | Admitting: *Deleted

## 2017-07-15 MED FILL — GABAPENTIN 300 MG CAPSULE: 300 | 30 days supply | Qty: 90 | Fill #1

## 2017-07-15 MED FILL — ?NIFEDIPINE ER 30 MG TABLET: 30 | 30 days supply | Qty: 30 | Fill #0

## 2017-07-15 NOTE — Telephone Encounter (Signed)
Pt states she can't feel her big toe and has to push walker down [address] to the bus stop, because the one that wears the pants want let her take me, and the city buses have tracker, but the green and white ones don't and they could take me anywhere. I told pt I knew who I was talking to and that I felt she would benefit from an appt today or tomorrow with Gretta Arab, RN prior to the next scheduled appt and to schedule what time worked best for her. Transferred pt to A. Horton to schedule.

## 2017-07-16 ENCOUNTER — Ambulatory Visit: Payer: No Typology Code available for payment source

## 2017-07-16 ENCOUNTER — Telehealth: Payer: Self-pay | Admitting: *Deleted

## 2017-07-16 ENCOUNTER — Encounter: Payer: No Typology Code available for payment source | Admitting: Podiatry
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
## 2017-07-17 ENCOUNTER — Ambulatory Visit (INDEPENDENT_AMBULATORY_CARE_PROVIDER_SITE_OTHER): Payer: Self-pay | Admitting: Podiatry

## 2017-07-17 ENCOUNTER — Encounter: Payer: Self-pay | Admitting: Podiatry

## 2017-07-17 DIAGNOSIS — Z9889 Other specified postprocedural states: Secondary | ICD-10-CM

## 2017-07-17 DIAGNOSIS — M722 Plantar fascial fibromatosis: Secondary | ICD-10-CM

## 2017-07-17 MED ORDER — TRAMADOL HCL 50 MG PO TABS: 50.0000 mg | ORAL_TABLET | Freq: Three times a day (TID) | ORAL | 0 refills | Status: DC | PRN

## 2017-07-17 MED FILL — traMADol HCL 50 MG TABS: 50 | 10 days supply | Qty: 30 | Fill #0

## 2017-07-17 NOTE — Progress Notes (Signed)
Patient presents for dressing change to her Rt foot. She states that she is still unable to walk on her foot, because it hurts.   Removed soiled dressing. Noted no open wound, no drainage, no erythema. Noted dry scaling skin.   Patient states she is still having pain when trying to weight bear on her foot. I advised her to only weight bear as tolerated. She is to follow up with Dr Amalia Hailey next week or sooner if problems arise. Refilled Tramadol.

## 2017-07-21 ENCOUNTER — Other Ambulatory Visit: Payer: No Typology Code available for payment source

## 2017-07-21 ENCOUNTER — Telehealth: Payer: Self-pay | Admitting: Neurology

## 2017-07-21 MED FILL — busPIRone HCL 5 MG TABS: 5 | 17 days supply | Qty: 51 | Fill #4
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
## 2017-07-21 NOTE — Telephone Encounter (Signed)
Patient states that she is having headache and nose bleed. She does not want medication for this but wanted [place] to know

## 2017-07-23 ENCOUNTER — Encounter: Payer: No Typology Code available for payment source | Admitting: Podiatry

## 2017-07-23 ENCOUNTER — Ambulatory Visit: Payer: No Typology Code available for payment source | Admitting: Podiatry

## 2017-07-24 ENCOUNTER — Ambulatory Visit (INDEPENDENT_AMBULATORY_CARE_PROVIDER_SITE_OTHER): Payer: No Typology Code available for payment source | Admitting: Podiatry

## 2017-07-24 DIAGNOSIS — M722 Plantar fascial fibromatosis: Secondary | ICD-10-CM

## 2017-07-24 MED ORDER — TRAMADOL HCL 50 MG PO TABS: 50.0000 mg | ORAL_TABLET | Freq: Three times a day (TID) | ORAL | 0 refills | Status: DC | PRN

## 2017-07-24 MED FILL — traMADol HCL 50 MG TABS: 50 | 10 days supply | Qty: 30 | Fill #0

## 2017-07-24 NOTE — Progress Notes (Signed)
Patient presents s/p plantar fascial fibroma excision surgery. She states that her foot still hurts a lot and she is not able to put weight on her foot at all because of the pain. She states the pain is burning and shooting in nature, radiates up her foot  Noted well healing surgical incision, very small amount of drainage on bandage. Did note swelling to foot and toes.   Removed old dressing, wrapped her foot in Unna boot, applied webril and coflex. Advised to remain non-weightbearing if it is uncomfortable to put pressure on her foot. She is to follow up in 5 days to remove Unna boot, and keep her scheduled appt to be evaluated by Dr Cannon Kettle

## 2017-07-28 ENCOUNTER — Other Ambulatory Visit: Payer: No Typology Code available for payment source

## 2017-07-29 ENCOUNTER — Encounter: Payer: Self-pay | Admitting: Podiatry

## 2017-07-29 NOTE — Progress Notes (Signed)
Medical records requested by [facility] Services were placed up front to be mailed out today Tuesday 29 July 2017 as the fax number listed is not working. Mailed to the following address:  [facility] [address]  [place], [place]. [id number]

## 2017-07-30 ENCOUNTER — Other Ambulatory Visit: Payer: No Typology Code available for payment source

## 2017-07-31 ENCOUNTER — Ambulatory Visit: Payer: Self-pay

## 2017-07-31 DIAGNOSIS — M722 Plantar fascial fibromatosis: Secondary | ICD-10-CM

## 2017-07-31 DIAGNOSIS — Z9889 Other specified postprocedural states: Secondary | ICD-10-CM

## 2017-07-31 MED ORDER — ACETAMINOPHEN-CODEINE #3 300-30 MG PO TABS: 1.0000 | ORAL_TABLET | Freq: Four times a day (QID) | ORAL | 0 refills | Status: DC | PRN

## 2017-08-01 MED FILL — ACETAMINOPHEN/COD #3 TABLET: 300-30 | 5 days supply | Qty: 20 | Fill #0

## 2017-08-04 NOTE — Progress Notes (Signed)
Patient presents s/p excision of plantar fascial fibroma. She states that she is still experiencing pain and discomfort in her foot and is still unable to walk on it.   Removed Unna boot, applied dry sterile gauze.   Patient requested something other than tramadol for pain management but was not wanting to take Vicodin or a strong strength medication. Discussed pain management with Dr March Rummage who advised to write for tylenol 3.   Encouraged patient to keep her appointment with the doctor for evaluation of continuing pain. I advised her that I could not manage her symptoms, she would need to see a doctor for further care. She has agreed to this.

## 2017-08-06 ENCOUNTER — Telehealth: Payer: Self-pay | Admitting: Podiatry

## 2017-08-06 ENCOUNTER — Telehealth: Payer: Self-pay | Admitting: *Deleted

## 2017-08-06 MED ORDER — TRAMADOL HCL 50 MG PO TABS: 50.0000 mg | ORAL_TABLET | Freq: Three times a day (TID) | ORAL | 0 refills | Status: DC | PRN

## 2017-08-06 NOTE — Telephone Encounter (Signed)
This message is for the nurse [facility]. I have some questions and concerns I would like to discuss. I called yesterday and asked to speak to a nurse but no one was available and I hit the wrong button or something I guess. Please call me back at [phone].

## 2017-08-06 NOTE — Telephone Encounter (Signed)
Pt states she has problems, but when she talks to me her problem is with her foot. Pt states her brother has moved in to visit and he is a internal medicine doctor and he said she needed to be seen by a wound care doctor, and Janett Billow said the foot was not healed. I transferred pt to scheduler to set up an appt with Janett Billow, RN for tomorrow. I spoke with Janett Billow after the transfer and she said pt was not to see her again, there was nothing for her to do for pt. Dr. Amalia Hailey then stated pt is to see no one else except him. I informed pt and she asked why Dr. Amalia Hailey had to see her. I told her to see if she needs to have other treatments or referrals, he performed her surgery and he wants to know her status and continuation of care. Pt states Dr. Cannon Kettle is in our practice and she could do that. I told pt I was following orders, and this would give her a continuum of care. Pt stated she did not want to see Dr. Amalia Hailey without a female in the room and I told her either Janett Billow, RN or I would be there as needed. Pt called states she is wanting to switch permanently to female doctors. I told pt that Dr. Amalia Hailey said if she wanted to be seen she would need to be seen by him and I would get her into an appt on 08/11/2017, pt stated she wanted to speak to whoever was over the whole [facility], because she wanted to know why she could not see whoever she wanted. I told pt I would give her contact number to our office manager and she would contact her.

## 2017-08-07 ENCOUNTER — Other Ambulatory Visit: Payer: No Typology Code available for payment source

## 2017-08-07 ENCOUNTER — Other Ambulatory Visit: Payer: Self-pay | Admitting: Family Medicine

## 2017-08-07 MED FILL — traMADol HCL 50 MG TABS: 50 | 10 days supply | Qty: 30 | Fill #0

## 2017-08-07 NOTE — Progress Notes (Signed)
Per [facility], they received pt's records in the mail on Tuesday 05 August 2017.

## 2017-08-08 ENCOUNTER — Telehealth: Payer: Self-pay

## 2017-08-08 NOTE — Telephone Encounter (Signed)
Patient called stating that she was really not getting much relief in pain from taking the Tramadol Q 8hrs and she really did not want to take anymore of the Tylenol 3 medication. I advised her that she could safely take tramadol Q 4-6 hours for pain relief. She stated understanding, and also asked that I be present in the room with her at her next appointment with Dr Cannon Kettle. I ensured her that I would be there to help her.

## 2017-08-11 MED FILL — HYDROCHLOROTHIAZIDE 25 MG T: 25 | 30 days supply | Qty: 30 | Fill #6

## 2017-08-11 MED FILL — ?NIFEDIPINE ER 30 MG TABLET: 30 | 30 days supply | Qty: 30 | Fill #1

## 2017-08-11 MED FILL — busPIRone HCL 5 MG TABS: 5 | 30 days supply | Qty: 90 | Fill #0

## 2017-08-11 MED FILL — GABAPENTIN 300 MG CAPSULE: 300 | 30 days supply | Qty: 90 | Fill #0

## 2017-08-12 ENCOUNTER — Encounter: Payer: Self-pay | Admitting: Sports Medicine

## 2017-08-12 ENCOUNTER — Ambulatory Visit: Payer: No Typology Code available for payment source | Admitting: Sports Medicine

## 2017-08-12 ENCOUNTER — Ambulatory Visit: Payer: No Typology Code available for payment source

## 2017-08-12 ENCOUNTER — Ambulatory Visit (INDEPENDENT_AMBULATORY_CARE_PROVIDER_SITE_OTHER): Payer: No Typology Code available for payment source | Admitting: Sports Medicine

## 2017-08-12 DIAGNOSIS — M722 Plantar fascial fibromatosis: Secondary | ICD-10-CM

## 2017-08-12 DIAGNOSIS — Z9889 Other specified postprocedural states: Secondary | ICD-10-CM

## 2017-08-12 MED ORDER — TRAMADOL HCL 50 MG PO TABS: 50.0000 mg | ORAL_TABLET | Freq: Three times a day (TID) | ORAL | 0 refills | Status: DC | PRN

## 2017-08-12 MED ORDER — LIDOCAINE 5 % EX OINT: 1.0000 "application " | TOPICAL_OINTMENT | Freq: Every day | CUTANEOUS | 0 refills | Status: DC | PRN

## 2017-08-12 MED ORDER — LIDOCAINE 5 % EX OINT: 1.0000 "application " | TOPICAL_OINTMENT | CUTANEOUS | 0 refills | Status: DC | PRN

## 2017-08-12 MED ORDER — TRAMADOL HCL 50 MG PO TABS: 50.0000 mg | ORAL_TABLET | ORAL | 0 refills | Status: DC

## 2017-08-12 MED FILL — traMADol HCL 50 MG TABS: 50 | 5 days supply | Qty: 30 | Fill #0

## 2017-08-12 MED FILL — LIDOCAINE 5 % OINT: 5 | 30 days supply | Qty: 35 | Fill #0

## 2017-08-12 NOTE — Telephone Encounter (Signed)
I ordered Topical Lidocaine for the patient at today's visit as well Thanks Dr. Chauncey Cruel

## 2017-08-12 NOTE — Telephone Encounter (Signed)
Pt states the tramadol had been changed by Marylou Mccoy on 08/08/2017, but had not been called to the [facility]. I reviewed Chart Review Telephone

## 2017-08-12 NOTE — Progress Notes (Addendum)
Subjective: Joy Barber is a 56 y.o. female patient seen today in office for POV (DOS 05-22-17), S/P Right foot excision of ganglion plantar foot with Dr. Amalia Hailey. Patient admits pain at surgical site and brown drainage from a small area along the surgical site, patient states that she is convinced that she needs to see a wound specialist per her brother who is an internal medicine doctor. Reports that she was been dressing areas with telfa and gentamicin covered with gauze and ace wrap. Patient has been using post op shoe and has been compliant with staying off foot. No other issues noted.   Patient Active Problem List   Diagnosis Date Noted  . ADHD (attention deficit hyperactivity disorder) 06/11/2013  . PTSD (post-traumatic stress disorder) 06/11/2013  . History of seizures 06/11/2013  . TBI (traumatic brain injury) ([place]) 06/11/2013    Current Outpatient Medications on File Prior to Visit  Medication Sig Dispense Refill  . acetaminophen-codeine (TYLENOL #3) 300-30 MG tablet Take 1 tablet every 6 (six) hours as needed by mouth for moderate pain. 20 tablet 0  . Ascorbic Acid (VITAMIN C PO) Take 1 tablet by mouth daily.    . Aspirin-Salicylamide-Caffeine (BC HEADACHE POWDER PO) Take 1 packet by mouth daily as needed (for headaches).    . busPIRone (BUSPAR) 5 MG tablet Take 1 tablet (5 mg total) by mouth 3 (three) times daily. 90 tablet 3  . diphenhydramine-acetaminophen (TYLENOL PM) 25-500 MG TABS tablet Take 1 tablet by mouth every morning.    . Flaxseed, Linseed, (FLAX SEEDS) POWD Sprinkle recommended amount onto food once a day and eat    . gabapentin (NEURONTIN) 300 MG capsule Take 1 capsule (300 mg total) by mouth 3 (three) times daily. 90 capsule 1  . gabapentin (NEURONTIN) 300 MG capsule TAKE 1 CAPSULE BY MOUTH 3 TIMES DAILY. 90 capsule 1  . gentamicin cream (GARAMYCIN) 0.1 % Apply 1 application topically 3 (three) times daily. 30 g 1  . hydrochlorothiazide (HYDRODIURIL) 25 MG tablet  Take 1 tablet (25 mg total) by mouth daily. (Patient taking differently: Take 25 mg by mouth daily as needed (for edema). ) 90 tablet 3  . HYDROcodone-acetaminophen (NORCO) 10-325 MG tablet Take 1 tablet by mouth every 8 (eight) hours as needed. (Patient not taking: Reported on 06/25/2017) 30 tablet 0  . NIFEdipine (PROCARDIA-XL/ADALAT-CC/NIFEDICAL-XL) 30 MG 24 hr tablet TAKE 1 TABLET BY MOUTH DAILY. 90 tablet 0  . NON FORMULARY Piracet 800 mg tablets (memory aid): Take 1 tablet by mouth once a day or as otherwise instructed    . Omega-3 Fatty Acids (FISH OIL PO) Take 1 capsule by mouth daily.    . salicylic acid 17 % gel Apply topically daily. (Patient not taking: Reported on 04/28/2017) 15 g 0  . sodium chloride (OCEAN) 0.65 % SOLN nasal spray Place 1-2 sprays into both nostrils 3 (three) times daily as needed for congestion.    . sulfamethoxazole-trimethoprim (BACTRIM DS,SEPTRA DS) 800-160 MG tablet Take 1 tablet by mouth 2 (two) times daily. 20 tablet 0  . terbinafine (LAMISIL) 250 MG tablet Take 1 tablet (250 mg total) by mouth daily. 90 tablet 0   Current Facility-Administered Medications on File Prior to Visit  Medication Dose Route Frequency Provider Last Rate Last Dose  . betamethasone acetate-betamethasone sodium phosphate (CELESTONE) injection 3 mg  3 mg Intramuscular Once Edrick Kins, DPM        Allergies  Allergen Reactions  . Cephalosporins Itching  . Nsaids Other (See  Comments)    History of subdural hematoma  . Penicillins Itching and Other (See Comments)    Welts (also) Has patient had a PCN reaction causing immediate rash, facial/tongue/throat swelling, SOB or lightheadedness with hypotension: Yes Has patient had a PCN reaction causing severe rash involving mucus membranes or skin necrosis: No Has patient had a PCN reaction that required hospitalization: Yes Has patient had a PCN reaction occurring within the last 10 years: No If all of the above answers are "NO", then may  proceed with Cephalosporin use.     Objective: There were no vitals filed for this visit.  General: No acute distress, AAOx3  Right foot: at the central aspect of the healed incision there is a small scabbed over wound with surrounding maceration, no erythema, no warmth, no active drainage, no other signs of infection noted, Capillary fill time <3 seconds in all digits, gross sensation present via light touch to right foot. Numbness and sharp pain at plantar foot, States difficult to feel toes.  No pain with calf compression.   Post Op Xray, Right foot:Midfoot arthritis and TN bone spur. Soft tissue swelling within normal limits for post op status. No other acute findings.   Assessment and Plan:  Problem List Items Addressed This Visit    None    Visit Diagnoses    Plantar fascial fibromatosis    -  Primary   Relevant Orders   DG Foot Complete Right (Completed)   Status post right foot surgery           -Patient seen and evaluated -Xrays reviewed  -Applied dry sterile dressing to surgical site right foot secured with ACE wrap and stockinet  -Advised patient to refrain from excessive use of antibiotic cream -Consult placed per patient request to wound care center -Advised patient to continue with post-op shoe on right foot   -Advised patient to limit activity to necessity  -Advised patient to ice and elevate as necessary  -Refilled Tramadol and Rx topical lidocaine to use for pain around the area -Patient to follow up as needed. In the meantime, patient to call office if any issues or problems arise.   Landis Martins, DPM

## 2017-08-13 ENCOUNTER — Telehealth: Payer: Self-pay | Admitting: *Deleted

## 2017-08-13 DIAGNOSIS — M722 Plantar fascial fibromatosis: Secondary | ICD-10-CM

## 2017-08-13 DIAGNOSIS — Z9889 Other specified postprocedural states: Secondary | ICD-10-CM

## 2017-08-13 NOTE — Telephone Encounter (Signed)
Faxed required form, clinicals and demographics to [facility].

## 2017-08-13 NOTE — Telephone Encounter (Signed)
-----   Message from Landis Martins, [place] sent at 08/12/2017  5:02 PM EST ----- Regarding: Consult wound care center  R foot surgical wound plantar foot, please evaluate Thanks Dr. Cannon Kettle

## 2017-08-18 NOTE — Telephone Encounter (Signed)
Pt states [facility] called with appt 10/01/2017, and she was told to continue wound care with our office until seen, but didn't know if she was to just walk in or schedule. I told pt I would check what Gretta Arab, RN, and she stated for pt's convenience, she should make an appt. I informed pt and she states that sounded good, and I transferred pt to schedulers.

## 2017-08-20 ENCOUNTER — Ambulatory Visit (INDEPENDENT_AMBULATORY_CARE_PROVIDER_SITE_OTHER): Payer: No Typology Code available for payment source | Admitting: Podiatry

## 2017-08-20 DIAGNOSIS — M722 Plantar fascial fibromatosis: Secondary | ICD-10-CM

## 2017-08-20 DIAGNOSIS — Z9889 Other specified postprocedural states: Secondary | ICD-10-CM

## 2017-08-20 MED ORDER — ACETAMINOPHEN-CODEINE #3 300-30 MG PO TABS: 1.0000 | ORAL_TABLET | ORAL | 0 refills | Status: DC | PRN

## 2017-08-20 MED ORDER — TRAMADOL HCL 50 MG PO TABS: 50.0000 mg | ORAL_TABLET | ORAL | 0 refills | Status: DC

## 2017-08-20 MED FILL — ACETAMINOPHEN/COD #3 TABLET: 300-30 | 5 days supply | Qty: 30 | Fill #0

## 2017-08-20 NOTE — Progress Notes (Signed)
   Patient presents stating that she is still having pain in her foot and she feels like the incision has opened.   Removed soiled dressing, noted gentamicin cream stuck to bottom of her foot. I removed all cream and cleansed wound with sterile saline. Re-applied non-stick bandage and sterile gauze and advised to d/c topical cream, leave area open to air at times and bandage when needed. She was given a refill on her pain medication per Dr Amalia Hailey, a letter was written for her and she was instructed to follow up with Dr Cannon Kettle at her regular scheduled time

## 2017-08-21 ENCOUNTER — Ambulatory Visit: Payer: No Typology Code available for payment source

## 2017-08-25 ENCOUNTER — Encounter: Payer: Self-pay | Admitting: Family Medicine

## 2017-08-25 ENCOUNTER — Ambulatory Visit (INDEPENDENT_AMBULATORY_CARE_PROVIDER_SITE_OTHER): Payer: No Typology Code available for payment source | Admitting: Family Medicine

## 2017-08-25 ENCOUNTER — Other Ambulatory Visit: Payer: Self-pay

## 2017-08-25 VITALS — BP 142/88 | HR 110 | Temp 98.1°F | Resp 18 | Ht 64.5 in | Wt 141.6 lb

## 2017-08-25 DIAGNOSIS — Z1231 Encounter for screening mammogram for malignant neoplasm of breast: Secondary | ICD-10-CM

## 2017-08-25 DIAGNOSIS — F411 Generalized anxiety disorder: Secondary | ICD-10-CM

## 2017-08-25 DIAGNOSIS — Z1239 Encounter for other screening for malignant neoplasm of breast: Secondary | ICD-10-CM

## 2017-08-25 DIAGNOSIS — F410 Panic disorder [episodic paroxysmal anxiety] without agoraphobia: Secondary | ICD-10-CM

## 2017-08-25 MED ORDER — BUSPIRONE HCL 15 MG PO TABS: 15.0000 mg | ORAL_TABLET | Freq: Three times a day (TID) | ORAL | 2 refills | Status: DC

## 2017-08-25 MED ORDER — HYDROXYZINE PAMOATE 100 MG PO CAPS: 100.0000 mg | ORAL_CAPSULE | Freq: Three times a day (TID) | ORAL | 2 refills | Status: DC | PRN

## 2017-08-25 MED ORDER — TRAMADOL HCL 50 MG PO TABS: 50.0000 mg | ORAL_TABLET | ORAL | 0 refills | Status: DC

## 2017-08-25 MED ORDER — BUPROPION HCL ER (XL) 150 MG PO TB24: 150.0000 mg | ORAL_TABLET | Freq: Every day | ORAL | 2 refills | Status: DC

## 2017-08-25 MED FILL — traMADol HCL 50 MG TABS: 50 | 5 days supply | Qty: 30 | Fill #0

## 2017-08-25 MED FILL — busPIRone HCL 15 MG TABS: 15 | 30 days supply | Qty: 90 | Fill #0

## 2017-08-25 MED FILL — HYDROXYZINE PAM 100 MG CAP: 100 | 30 days supply | Qty: 90 | Fill #0

## 2017-08-25 MED FILL — BUPROPION HCL XL 150 MG TAB: 150 | 30 days supply | Qty: 30 | Fill #0

## 2017-08-25 NOTE — Progress Notes (Addendum)
Patient ID: Joy Barber, female    DOB: 09-Mar-1961, 56 y.o.   MRN: [id number]  PCP: Scot Jun, FNP  Chief Complaint  Patient presents with  . Follow-up    4 month  . Nevus    under my breast    Subjective:  HPI Joy Barber is a 56 y.o. female presents for evaluation of severe anxiety and evaluation of nevi. Joy Barber presents in the clinic today, hysterical, yelling belligerently, causing a disturbance within the clinic. She was verbally counseled and returned to the exam room without incident. Upon arriving to the room, Joy Barber is reporting that the person she lives with mixed Nares shaving lotion into her shampoo. She brings in a sample of hair and requests that the hair be tested for chemicals.  Joy Barber is also angry regarding what she describes as a "bad experience" following recent foot surgery. She is rambling and unable to provide specifics regarding what happened. She continues to scream, "I'm not depressed and I am mad and need something to calm me down". She reports that she is not suicidal, frustrated that she has to live in basement of a family members home.  She is also concerned about a mole she has on her lower torso. The mole has been present for years, she's only concerned that it appears to be raised.  Joy Barber also requests to have a breast mammogram.  Social History   Socioeconomic History  . Marital status: Divorced    Spouse name: n/a  . Number of children: 1  . Years of education: Not on file  . Highest education level: Not on file  Social Needs  . Financial resource strain: Not on file  . Food insecurity - worry: Not on file  . Food insecurity - inability: Not on file  . Transportation needs - medical: Not on file  . Transportation needs - non-medical: Not on file  Occupational History  . Occupation: house cleaning  Tobacco Use  . Smoking status: Current Some Day Smoker    Types: Cigars  . Smokeless tobacco: Former Network engineer and Sexual  Activity  . Alcohol use: No  . Drug use: Yes    Frequency: 7.0 times per week    Types: Marijuana    Comment: uses CBD oil  . Sexual activity: Yes    Birth control/protection: Surgical  Other Topics Concern  . Not on file  Social History Narrative   Former Marine scientist, prior to TBI.   Working on Contractor at [employer].   Currently out of work until can be released to go back to work, is talking about having telepathic abilities, but said not to mention it in her chart, but that her professors at [employer] would email her saying she was contacting them in their dreams.    Family History  Problem Relation Age of Onset  . Cancer Mother        breast  . Diabetes Father   . Cancer Father        testicular  . Hypertension Brother   . Heart disease Brother      Review of Systems  Constitutional: Negative.   Genitourinary: Negative for vaginal pain.  Psychiatric/Behavioral: Positive for agitation. The patient is hyperactive.     Patient Active Problem List   Diagnosis Date Noted  . ADHD (attention deficit hyperactivity disorder) 06/11/2013  . PTSD (post-traumatic stress disorder) 06/11/2013  . History of seizures 06/11/2013  . TBI (traumatic brain injury) ([place])  06/11/2013    Allergies  Allergen Reactions  . Cephalosporins Itching  . Nsaids Other (See Comments)    History of subdural hematoma  . Penicillins Itching and Other (See Comments)    Welts (also) Has patient had a PCN reaction causing immediate rash, facial/tongue/throat swelling, SOB or lightheadedness with hypotension: Yes Has patient had a PCN reaction causing severe rash involving mucus membranes or skin necrosis: No Has patient had a PCN reaction that required hospitalization: Yes Has patient had a PCN reaction occurring within the last 10 years: No If all of the above answers are "NO", then may proceed with Cephalosporin use.     Prior to Admission medications   Medication Sig Start  Date End Date Taking? Authorizing Provider  acetaminophen-codeine (TYLENOL #3) 300-30 MG tablet Take 1 tablet by mouth every 4 (four) hours as needed for moderate pain. 08/20/17  Yes Edrick Kins, DPM  busPIRone (BUSPAR) 5 MG tablet Take 1 tablet (5 mg total) by mouth 3 (three) times daily. 04/23/17  Yes Scot Jun, FNP  diphenhydramine-acetaminophen (TYLENOL PM) 25-500 MG TABS tablet Take 1 tablet by mouth every morning.   Yes [provider]  gabapentin (NEURONTIN) 300 MG capsule TAKE 1 CAPSULE BY MOUTH 3 TIMES DAILY. 08/07/17  Yes Scot Jun, FNP  gentamicin cream (GARAMYCIN) 0.1 % Apply 1 application topically 3 (three) times daily. 07/02/17  Yes Edrick Kins, DPM  hydrochlorothiazide (HYDRODIURIL) 25 MG tablet Take 1 tablet (25 mg total) by mouth daily. Patient taking differently: Take 25 mg by mouth daily as needed (for edema).  01/31/17  Yes Scot Jun, FNP  lidocaine (XYLOCAINE) 5 % ointment Apply 1 application topically daily as needed. 08/12/17  Yes Stover, Titorya, DPM  NIFEdipine (PROCARDIA-XL/ADALAT-CC/NIFEDICAL-XL) 30 MG 24 hr tablet TAKE 1 TABLET BY MOUTH DAILY. 07/09/17  Yes Scot Jun, FNP  Omega-3 Fatty Acids (FISH OIL PO) Take 1 capsule by mouth daily.   Yes [provider]  terbinafine (LAMISIL) 250 MG tablet Take 1 tablet (250 mg total) by mouth daily. 01/03/17  Yes Scot Jun, FNP  traMADol (ULTRAM) 50 MG tablet Take by mouth every 4 (four) hours as needed.   Yes [provider]  Ascorbic Acid (VITAMIN C PO) Take 1 tablet by mouth daily.    [provider]  HYDROcodone-acetaminophen (NORCO) 10-325 MG tablet Take 1 tablet by mouth every 8 (eight) hours as needed. Patient not taking: Reported on 06/25/2017 05/28/17   Edrick Kins, DPM  NON FORMULARY Piracet 800 mg tablets (memory aid): Take 1 tablet by mouth once a day or as otherwise instructed    [provider]  salicylic acid 17 % gel Apply  topically daily. Patient not taking: Reported on 04/28/2017 07/02/16   Frederica Kuster, PA-C  sodium chloride (OCEAN) 0.65 % SOLN nasal spray Place 1-2 sprays into both nostrils 3 (three) times daily as needed for congestion.    [provider]    Past Medical, Surgical Family and Social History reviewed and updated.    Objective:   Today's Vitals   08/25/17 1120  BP: (!) 142/88  Pulse: (!) 110  Resp: 18  Temp: 98.1 F (36.7 C)  TempSrc: Oral  SpO2: 99%  Weight: 141 lb 9.6 oz (64.2 kg)  Height: 5' 4.5" (1.638 m)    Wt Readings from Last 3 Encounters:  08/25/17 141 lb 9.6 oz (64.2 kg)  06/25/17 135 lb 12.8 oz (61.6 kg)  05/22/17 145 lb  4.8 oz (65.9 kg)    Physical Exam  Constitutional: She is oriented to person, place, and time. She appears well-developed and well-nourished.  HENT:  Head: Normocephalic and atraumatic.  Eyes: Pupils are equal, round, and reactive to light.  Neck: Normal range of motion. Neck supple.  Cardiovascular: Normal rate, regular rhythm, normal heart sounds and intact distal pulses.  Pulmonary/Chest: Effort normal and breath sounds normal.  Neurological: She is alert and oriented to person, place, and time.  Psychiatric: Her mood appears anxious. Her affect is inappropriate. Her speech is rapid and/or pressured. She is agitated and hyperactive. Thought content is paranoid. She expresses impulsivity and inappropriate judgment. She is inattentive.   Assessment & Plan:  1. Severe anxiety with panic, patient is followed closely at [facility]. As patient presents in such a hysterical state today, will add buspirone 15 mg, 3 times daily for anxiety and hydroxyzine 100 mg, 3 times daily as needed. [facility] with [facility]  2. Generalized anxiety disorder, uncontrolled, currently active, adding Wellbutrin 150 mg XL once daily.  Follow-up with [facility].  3. Screening for breast cancer - MM Digital  Screening; Future  Very limited history able to be ascertained due to patient's emotional state. Encouraged patient to schedule a follow-up with [facility] for counseling services.  Meds ordered this encounter  Medications  . busPIRone (BUSPAR) 15 MG tablet    Sig: Take 1 tablet (15 mg total) by mouth 3 (three) times daily.    Dispense:  90 tablet    Refill:  2    Order Specific Question:   Supervising Provider    Answer:   Tresa Garter [id number]  . hydrOXYzine (VISTARIL) 100 MG capsule    Sig: Take 1 capsule (100 mg total) by mouth 3 (three) times daily as needed for itching.    Dispense:  90 capsule    Refill:  2    Order Specific Question:   Supervising Provider    Answer:   Tresa Garter [id number]  . buPROPion (WELLBUTRIN XL) 150 MG 24 hr tablet    Sig: Take 1 tablet (150 mg total) by mouth daily.    Dispense:  90 tablet    Refill:  2    Order Specific Question:   Supervising Provider    Answer:   Tresa Garter [id number]    RTC:  6 weeks for evaluation of anxiety and medication effectiveness.   A total of 25 minutes spent, greater than 50 % of this time was spent reviewing prior medical history, reviewing medications and indications of treatment, prior labs and diagnostic tests, discussing current plan of treatment, health promotion, and goals of treatment.  Carroll Sage. [name], MSN, FNP-C [facility]  [address] Barbara Cower [place], [place] [address] [phone]

## 2017-08-26 ENCOUNTER — Telehealth: Payer: Self-pay

## 2017-08-26 NOTE — Telephone Encounter (Signed)
Patient would like for the Norco to be removed from her chart as she is no longer taking it

## 2017-08-28 ENCOUNTER — Telehealth: Payer: Self-pay

## 2017-08-28 NOTE — Telephone Encounter (Signed)
Is patient eligible for the [facility] program for mammogram.

## 2017-08-29 ENCOUNTER — Telehealth (HOSPITAL_COMMUNITY): Payer: Self-pay | Admitting: *Deleted

## 2017-08-29 NOTE — Telephone Encounter (Signed)
Telephoned patient at home number and left message to return call to [facility] 

## 2017-09-02 ENCOUNTER — Ambulatory Visit: Payer: No Typology Code available for payment source | Admitting: Sports Medicine

## 2017-09-05 ENCOUNTER — Ambulatory Visit: Payer: No Typology Code available for payment source

## 2017-09-05 ENCOUNTER — Encounter: Payer: Self-pay | Admitting: Sports Medicine

## 2017-09-05 ENCOUNTER — Other Ambulatory Visit (HOSPITAL_COMMUNITY): Payer: Self-pay | Admitting: *Deleted

## 2017-09-05 DIAGNOSIS — M722 Plantar fascial fibromatosis: Secondary | ICD-10-CM

## 2017-09-05 DIAGNOSIS — N6310 Unspecified lump in the right breast, unspecified quadrant: Secondary | ICD-10-CM

## 2017-09-05 DIAGNOSIS — M25472 Effusion, left ankle: Secondary | ICD-10-CM

## 2017-09-05 DIAGNOSIS — N644 Mastodynia: Secondary | ICD-10-CM

## 2017-09-05 MED ORDER — ACETAMINOPHEN-CODEINE #3 300-30 MG PO TABS: 1.0000 | ORAL_TABLET | ORAL | 0 refills | Status: DC | PRN

## 2017-09-05 MED FILL — ACETAMINOPHEN/COD #3 TABLET: 300-30 | 5 days supply | Qty: 30 | Fill #0

## 2017-09-08 ENCOUNTER — Other Ambulatory Visit: Payer: Self-pay | Admitting: Family Medicine

## 2017-09-08 MED FILL — ?NIFEDIPINE ER 30 MG TABLET: 30 | 30 days supply | Qty: 30 | Fill #2

## 2017-09-08 MED FILL — GABAPENTIN 300 MG CAPSULE: 300 | 30 days supply | Qty: 90 | Fill #1

## 2017-09-09 ENCOUNTER — Encounter: Payer: Self-pay | Admitting: Sports Medicine

## 2017-09-09 ENCOUNTER — Ambulatory Visit: Payer: No Typology Code available for payment source | Admitting: Sports Medicine

## 2017-09-09 ENCOUNTER — Ambulatory Visit (INDEPENDENT_AMBULATORY_CARE_PROVIDER_SITE_OTHER): Payer: No Typology Code available for payment source | Admitting: Sports Medicine

## 2017-09-09 DIAGNOSIS — L84 Corns and callosities: Secondary | ICD-10-CM

## 2017-09-09 DIAGNOSIS — M722 Plantar fascial fibromatosis: Secondary | ICD-10-CM

## 2017-09-09 DIAGNOSIS — Z9889 Other specified postprocedural states: Secondary | ICD-10-CM

## 2017-09-09 MED ORDER — TRAMADOL HCL 50 MG PO TABS: 50.0000 mg | ORAL_TABLET | ORAL | 0 refills | Status: DC

## 2017-09-09 NOTE — Progress Notes (Signed)
Subjective: Joy Barber is a 56 y.o. female patient seen today in office for POV (DOS 05-22-17), S/P Right foot excision of ganglion plantar foot with Dr. Amalia Hailey. Patient admits pain at surgical site burning and stinging. Admits that she has been leaving it open to air at home and has been compliant with antibiotic cream. Reports that she was been dressing areas with telfa and gentamicin covered with gauze and ace wrap. Patient has been using post op shoe and has been compliant with staying off foot using rolling walker. Admits pain also to left foot with thick build up of skin. No other issues noted.   Patient Active Problem List   Diagnosis Date Noted  . ADHD (attention deficit hyperactivity disorder) 06/11/2013  . PTSD (post-traumatic stress disorder) 06/11/2013  . History of seizures 06/11/2013  . TBI (traumatic brain injury) ([place]) 06/11/2013    Current Outpatient Medications on File Prior to Visit  Medication Sig Dispense Refill  . acetaminophen-codeine (TYLENOL #3) 300-30 MG tablet Take 1 tablet by mouth every 4 (four) hours as needed for moderate pain. 30 tablet 0  . Ascorbic Acid (VITAMIN C PO) Take 1 tablet by mouth daily.    Marland Kitchen buPROPion (WELLBUTRIN XL) 150 MG 24 hr tablet Take 1 tablet (150 mg total) by mouth daily. 90 tablet 2  . busPIRone (BUSPAR) 15 MG tablet Take 1 tablet (15 mg total) by mouth 3 (three) times daily. 90 tablet 2  . diphenhydramine-acetaminophen (TYLENOL PM) 25-500 MG TABS tablet Take 1 tablet by mouth every morning.    . gabapentin (NEURONTIN) 300 MG capsule TAKE 1 CAPSULE BY MOUTH 3 TIMES DAILY. 90 capsule 1  . gentamicin cream (GARAMYCIN) 0.1 % Apply 1 application topically 3 (three) times daily. 30 g 1  . hydrochlorothiazide (HYDRODIURIL) 25 MG tablet Take 1 tablet (25 mg total) by mouth daily. (Patient taking differently: Take 25 mg by mouth daily as needed (for edema). ) 90 tablet 3  . hydrOXYzine (VISTARIL) 100 MG capsule Take 1 capsule (100 mg total) by  mouth 3 (three) times daily as needed for itching. 90 capsule 2  . lidocaine (XYLOCAINE) 5 % ointment Apply 1 application topically daily as needed. 35.44 g 0  . NIFEdipine (PROCARDIA-XL/ADALAT-CC/NIFEDICAL-XL) 30 MG 24 hr tablet TAKE 1 TABLET BY MOUTH DAILY. 90 tablet 0  . NON FORMULARY Piracet 800 mg tablets (memory aid): Take 1 tablet by mouth once a day or as otherwise instructed    . Omega-3 Fatty Acids (FISH OIL PO) Take 1 capsule by mouth daily.    . salicylic acid 17 % gel Apply topically daily. (Patient not taking: Reported on 04/28/2017) 15 g 0  . sodium chloride (OCEAN) 0.65 % SOLN nasal spray Place 1-2 sprays into both nostrils 3 (three) times daily as needed for congestion.    . terbinafine (LAMISIL) 250 MG tablet Take 1 tablet (250 mg total) by mouth daily. 90 tablet 0   Current Facility-Administered Medications on File Prior to Visit  Medication Dose Route Frequency Provider Last Rate Last Dose  . betamethasone acetate-betamethasone sodium phosphate (CELESTONE) injection 3 mg  3 mg Intramuscular Once Edrick Kins, DPM        Allergies  Allergen Reactions  . Cephalosporins Itching  . Nsaids Other (See Comments)    History of subdural hematoma  . Penicillins Itching and Other (See Comments)    Welts (also) Has patient had a PCN reaction causing immediate rash, facial/tongue/throat swelling, SOB or lightheadedness with hypotension: Yes Has patient  had a PCN reaction causing severe rash involving mucus membranes or skin necrosis: No Has patient had a PCN reaction that required hospitalization: Yes Has patient had a PCN reaction occurring within the last 10 years: No If all of the above answers are "NO", then may proceed with Cephalosporin use.     Objective: There were no vitals filed for this visit.  General: No acute distress, AAOx3  Right foot: at the central aspect of the healed incision there is a small ulceration measures 0.2x0.2cm, no erythema, no warmth, no active  drainage, no other signs of infection noted, Capillary fill time <3 seconds in all digits, gross sensation present via light touch to right foot. Numbness and sharp pain at plantar foot.  No pain with calf compression.   Left foot callus sub met 2 and medial hallux with no signs of infection. Thickened plantar medial fascial band with fibroma present with no signs of infection.   Assessment and Plan:  Problem List Items Addressed This Visit    None    Visit Diagnoses    Plantar fibromatosis    -  Primary   Post-operative state       Status post right foot surgery       Corns and callosities          -Patient seen and evaluated  -Applied antibiotic cream and  dry sterile dressing to surgical site right foot secured with ACE wrap and stockinet  -Advised patient to refrain from excessive use of antibiotic cream and leave open to air at night -Awaiting Consult placed per patient request to wound care center -Advised patient to continue with post-op shoe on right foot   -Advised patient to limit activity to necessity  -Advised patient to ice and elevate as necessary  -Refilled Tramadol -Patient to follow up 7-10 days until seen by wound care center. In the meantime, patient to call office if any issues or problems arise.  Advised patient that once she has been seen by wound care center we will defer car to them until she is healed. Advised patient to stay off foot until healed. Once healed we will do 1 final check and progress activities back to normal. As for the left foot parred callus using sterile chisel blade and advised patient to use skin emollients. Advised patient that we will not do any injections for the left plantar fibroma until the right is healed, patient expressed understanding. -Note given to patient with treatment plan for insurance.   Landis Martins, DPM

## 2017-09-11 MED FILL — traMADol HCL 50 MG TABS: 50 | 5 days supply | Qty: 30 | Fill #0

## 2017-09-17 ENCOUNTER — Ambulatory Visit: Payer: No Typology Code available for payment source | Attending: Family Medicine

## 2017-09-17 ENCOUNTER — Ambulatory Visit: Payer: No Typology Code available for payment source

## 2017-09-17 ENCOUNTER — Ambulatory Visit (INDEPENDENT_AMBULATORY_CARE_PROVIDER_SITE_OTHER): Payer: Self-pay

## 2017-09-17 ENCOUNTER — Ambulatory Visit: Payer: No Typology Code available for payment source | Admitting: Family Medicine

## 2017-09-17 DIAGNOSIS — M25472 Effusion, left ankle: Secondary | ICD-10-CM

## 2017-09-17 DIAGNOSIS — Z9889 Other specified postprocedural states: Secondary | ICD-10-CM

## 2017-09-17 DIAGNOSIS — M722 Plantar fascial fibromatosis: Secondary | ICD-10-CM

## 2017-09-17 MED ORDER — TRAMADOL HCL 50 MG PO TABS: 50.0000 mg | ORAL_TABLET | ORAL | 0 refills | Status: DC

## 2017-09-17 MED ORDER — ACETAMINOPHEN-CODEINE #3 300-30 MG PO TABS: 1.0000 | ORAL_TABLET | ORAL | 0 refills | Status: DC | PRN

## 2017-09-17 MED FILL — ACETAMINOPHEN/COD #3 TABLET: 300-30 | 10 days supply | Qty: 30 | Fill #0

## 2017-09-17 MED FILL — traMADol HCL 50 MG TABS: 50 | 10 days supply | Qty: 30 | Fill #0

## 2017-09-19 ENCOUNTER — Ambulatory Visit (HOSPITAL_COMMUNITY)
Admission: RE | Admit: 2017-09-19 | Discharge: 2017-09-19 | Disposition: A | Discharge status: Home / Self Care | Payer: Medicaid Other | Source: Ambulatory Visit | Attending: Family Medicine | Admitting: Family Medicine

## 2017-09-19 ENCOUNTER — Ambulatory Visit (INDEPENDENT_AMBULATORY_CARE_PROVIDER_SITE_OTHER): Payer: Medicaid Other | Admitting: Family Medicine

## 2017-09-19 ENCOUNTER — Encounter: Payer: Self-pay | Admitting: Family Medicine

## 2017-09-19 VITALS — BP 126/78 | HR 90 | Temp 98.0°F | Resp 16 | Ht 64.5 in

## 2017-09-19 DIAGNOSIS — S8992XA Unspecified injury of left lower leg, initial encounter: Secondary | ICD-10-CM | POA: Insufficient documentation

## 2017-09-19 DIAGNOSIS — X58XXXA Exposure to other specified factors, initial encounter: Secondary | ICD-10-CM | POA: Insufficient documentation

## 2017-09-19 DIAGNOSIS — M25462 Effusion, left knee: Secondary | ICD-10-CM | POA: Diagnosis not present
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
## 2017-09-19 NOTE — Patient Instructions (Addendum)
-  Apply ice compresses to your left knee 3 times per day for 10-20 minute increments.   -For knee inflammation and swelling, I am prescribing Naproxen 500 mg twice daily for 15 days.

## 2017-09-22 MED ORDER — NAPROXEN 500 MG PO TABS: 500.0000 mg | ORAL_TABLET | Freq: Two times a day (BID) | ORAL | 0 refills | Status: DC

## 2017-09-24 ENCOUNTER — Other Ambulatory Visit: Payer: Self-pay | Admitting: Family Medicine

## 2017-09-24 ENCOUNTER — Telehealth: Payer: Self-pay | Admitting: Family Medicine

## 2017-09-24 DIAGNOSIS — F411 Generalized anxiety disorder: Secondary | ICD-10-CM

## 2017-09-24 MED ORDER — BUSPIRONE HCL 5 MG PO TABS: 15.0000 mg | ORAL_TABLET | Freq: Three times a day (TID) | ORAL | 2 refills | Status: DC

## 2017-09-24 MED FILL — busPIRone HCL 5 MG TABS: 5 | 30 days supply | Qty: 270 | Fill #0

## 2017-09-24 MED FILL — BUPROPION HCL XL 150 MG TAB: 150 | 30 days supply | Qty: 30 | Fill #1

## 2017-09-24 MED FILL — HYDROXYZINE PAM 100 MG CAP: 100 | 29 days supply | Qty: 89 | Fill #1

## 2017-09-24 NOTE — Progress Notes (Signed)
Spoke with patient regarding imaging of left knee. Reports knee is improving with ice applications and bracing. She is not taking Naproxen .   Called [facility] to confirm receipt of Buspar script for 15 mg (3 tablets) three times daily. Script was not received. Resent electronically.

## 2017-09-24 NOTE — Telephone Encounter (Signed)
Called patient to discuss results of recent left knee imaging which were negative of acute fracture. She has a mild amount of fluid in her right knee. I would like for her to continue rest, elevation, naproxen. Keep follow-up.  Carroll Sage. [name], MSN, FNP-C [facility]  [address] [place], [place] [address] [phone]

## 2017-09-24 NOTE — Progress Notes (Signed)
E-prescribed updated dose of Buspar take three 5 mg tablet (15 mg)  Up to 3 times daily for anxiety.  Buspar 15 mg is backordered per pharmacy.  Carroll Sage. [name], MSN, FNP-C [facility]  [address] [place], [place] [address] [phone]

## 2017-09-25 ENCOUNTER — Other Ambulatory Visit: Payer: Self-pay | Admitting: Podiatry

## 2017-09-25 ENCOUNTER — Telehealth: Payer: Self-pay | Admitting: Sports Medicine

## 2017-09-25 MED ORDER — ACETAMINOPHEN-CODEINE #3 300-30 MG PO TABS: 1.0000 | ORAL_TABLET | ORAL | 0 refills | Status: DC | PRN

## 2017-09-25 MED ORDER — TRAMADOL HCL 50 MG PO TABS: 50.0000 mg | ORAL_TABLET | ORAL | 0 refills | Status: DC

## 2017-09-25 NOTE — Telephone Encounter (Signed)
This is Lurena Joiner calling from the [facility]. The pt was here demanding a couple of prescriptions and I was hoping to talk to somebody. We have some medicines with no refills and we would need new prescriptions. She is very adamant about [place] calling you and getting the prescriptions. I don't know what to do as she just gave me a bunch of numbers to call. If you could call me back at [phone]. Thank you.

## 2017-09-25 NOTE — Telephone Encounter (Addendum)
Dr. Amalia Hailey states refill the tylenol #3 and tramadol as previously. I informed [facility] of Dr. Amalia Hailey orders. Faxed orders to [facility].

## 2017-09-25 NOTE — Addendum Note (Signed)
Addended by: Harriett Sine D on: 09/25/2017 03:17 PM   Modules accepted: Orders

## 2017-09-26 ENCOUNTER — Telehealth: Payer: Self-pay | Admitting: Neurology

## 2017-09-26 MED FILL — ACETAMINOPHEN/COD #3 TABLET: 300-30 | 10 days supply | Qty: 30 | Fill #0

## 2017-09-26 MED FILL — traMADol HCL 50 MG TABS: 50 | 10 days supply | Qty: 30 | Fill #0

## 2017-09-26 NOTE — Telephone Encounter (Signed)
Called and LM for Pt to rtrn call

## 2017-09-26 NOTE — Telephone Encounter (Signed)
Pt left a voicemail message asking for a call back

## 2017-09-26 NOTE — Telephone Encounter (Signed)
Pt left a voicemail message asking for a call back from the nurse and did not say why

## 2017-09-29 ENCOUNTER — Ambulatory Visit: Payer: No Typology Code available for payment source

## 2017-09-29 ENCOUNTER — Ambulatory Visit (INDEPENDENT_AMBULATORY_CARE_PROVIDER_SITE_OTHER): Payer: Self-pay

## 2017-09-29 ENCOUNTER — Encounter: Payer: Self-pay | Admitting: Sports Medicine

## 2017-09-29 DIAGNOSIS — S93401A Sprain of unspecified ligament of right ankle, initial encounter: Secondary | ICD-10-CM

## 2017-09-29 DIAGNOSIS — Z9889 Other specified postprocedural states: Secondary | ICD-10-CM

## 2017-09-29 DIAGNOSIS — M722 Plantar fascial fibromatosis: Secondary | ICD-10-CM

## 2017-09-29 NOTE — Telephone Encounter (Signed)
Pt called back on 09/27/47 @ 4:19p, LM she was returning my call. Called patient and LM again
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
## 2017-10-01 ENCOUNTER — Encounter (HOSPITAL_BASED_OUTPATIENT_CLINIC_OR_DEPARTMENT_OTHER): Payer: No Typology Code available for payment source

## 2017-10-01 MED FILL — ?NIFEDIPINE ER 30 MG TABLET: 30 | 30 days supply | Qty: 90 | Fill #0

## 2017-10-02 ENCOUNTER — Ambulatory Visit (HOSPITAL_COMMUNITY): Payer: No Typology Code available for payment source

## 2017-10-02 ENCOUNTER — Other Ambulatory Visit: Payer: No Typology Code available for payment source

## 2017-10-02 NOTE — Progress Notes (Signed)
Patient presents s/p excision of plantar fascial fibroma. She states that she is still experiencing pain and discomfort in her foot and is still unable to walk on it.    Wound on her right foot appears to be healing well, with pinpoint opening that runs along the incision scar. Scant amount of light brown drainage noted on bandage. Applied neosporin and non stick gauze. Advised on daily dressing changes and to follow up in approximately 14 days for re-evaluation and dressing change.

## 2017-10-06 NOTE — Telephone Encounter (Signed)
Pt calling about medical records she went to pick up from [facility]. She states her records say she had brain surgery and although she is psychotic, she is not stupid and did not have brain surgery. She wants me to look at her records and tell me what they mean. I advised her when she comes for her EEG she can bring the records then. She was adamant I need to be with her in the room for the EEG because that is how the aliens control you and she's tired of being controled. She trusts She does not want anything in her record that is not true. She then said If her Patient advocate could not come with her, she didn't trust anyone anymore and will cancel. Patient then thanked me and hung up

## 2017-10-06 NOTE — Progress Notes (Signed)
Patient presents stating that she fell a few days ago and twisted her right foot. She states that her pain has increased since the incident and swelling remains.   Noted swelling to top and lateral sides of the foot and ankle. Wound located on the bottom of the foot has closed at this point, no open area no drainage, no erythema to incision area on bottom of foot. Incision appears to be healing well  Xrays were obtained this visit and were reviewed by Dr Amalia Hailey. No fractures or dislocation noted.   Tefla pad was placed over healed incision and Unna boot was placed on foot and ankle to just below the knee. I advised patient to keep Unna boot in place for 5-7 days and remain NWB to right foot. After that time she is to continue with clean dry dressing changes daily and keep her foot dry until she is seen by Dr Cannon Kettle. Follow up at regularly schedule appointment

## 2017-10-07 ENCOUNTER — Ambulatory Visit: Payer: No Typology Code available for payment source | Admitting: Sports Medicine

## 2017-10-07 ENCOUNTER — Ambulatory Visit: Payer: No Typology Code available for payment source

## 2017-10-07 ENCOUNTER — Encounter: Payer: Self-pay | Admitting: Sports Medicine

## 2017-10-07 DIAGNOSIS — Z9889 Other specified postprocedural states: Secondary | ICD-10-CM

## 2017-10-07 DIAGNOSIS — M779 Enthesopathy, unspecified: Secondary | ICD-10-CM

## 2017-10-07 DIAGNOSIS — S93401A Sprain of unspecified ligament of right ankle, initial encounter: Secondary | ICD-10-CM

## 2017-10-07 DIAGNOSIS — M722 Plantar fascial fibromatosis: Secondary | ICD-10-CM

## 2017-10-07 MED ORDER — ACETAMINOPHEN-CODEINE #3 300-30 MG PO TABS: 1.0000 | ORAL_TABLET | ORAL | 0 refills | Status: DC | PRN

## 2017-10-07 MED ORDER — TRAMADOL HCL 50 MG PO TABS: 50.0000 mg | ORAL_TABLET | ORAL | 0 refills | Status: DC

## 2017-10-07 MED FILL — traMADol HCL 50 MG TABS: 50 | 10 days supply | Qty: 30 | Fill #0

## 2017-10-07 MED FILL — GABAPENTIN 300 MG CAPSULE: 300 | 30 days supply | Qty: 90 | Fill #0

## 2017-10-07 MED FILL — ACETAMINOPHEN/COD #3 TABLET: 300-30 | 10 days supply | Qty: 30 | Fill #0

## 2017-10-07 NOTE — Progress Notes (Signed)
Subjective: Joy Barber is a 57 y.o. female patient seen today in office for POV (DOS 05-22-17), S/P Right foot excision of ganglion plantar foot with Dr. Amalia Hailey. Patient admits pain at surgical site burning and stinging and that she had to re-wrap her foot because the unna boot got tight. States that swelling is much better. Admits that she also has pain at knot on her achilles on her left that she wants me to look at. No other issues noted.   Patient Active Problem List   Diagnosis Date Noted  . ADHD (attention deficit hyperactivity disorder) 06/11/2013  . PTSD (post-traumatic stress disorder) 06/11/2013  . History of seizures 06/11/2013  . TBI (traumatic brain injury) ([place]) 06/11/2013    Current Outpatient Medications on File Prior to Visit  Medication Sig Dispense Refill  . Ascorbic Acid (VITAMIN C PO) Take 1 tablet by mouth daily.    Marland Kitchen buPROPion (WELLBUTRIN XL) 150 MG 24 hr tablet Take 1 tablet (150 mg total) by mouth daily. 90 tablet 2  . busPIRone (BUSPAR) 5 MG tablet Take 3 tablets (15 mg total) by mouth 3 (three) times daily. 270 tablet 2  . diphenhydramine-acetaminophen (TYLENOL PM) 25-500 MG TABS tablet Take 1 tablet by mouth every morning.    . gabapentin (NEURONTIN) 300 MG capsule TAKE 1 CAPSULE BY MOUTH 3 TIMES DAILY. 90 capsule 1  . gentamicin cream (GARAMYCIN) 0.1 % Apply 1 application topically 3 (three) times daily. 30 g 1  . hydrochlorothiazide (HYDRODIURIL) 25 MG tablet Take 1 tablet (25 mg total) by mouth daily. (Patient taking differently: Take 25 mg by mouth daily as needed (for edema). ) 90 tablet 3  . hydrOXYzine (VISTARIL) 100 MG capsule Take 1 capsule (100 mg total) by mouth 3 (three) times daily as needed for itching. (Patient taking differently: Take 100 mg by mouth daily. ) 90 capsule 2  . NIFEdipine (PROCARDIA-XL/ADALAT CC) 30 MG 24 hr tablet TAKE 1 TABLET BY MOUTH DAILY. 90 tablet 0  . NON FORMULARY Piracet 800 mg tablets (memory aid): Take 1 tablet by mouth  once a day or as otherwise instructed    . Omega-3 Fatty Acids (FISH OIL PO) Take 1 capsule by mouth daily.    . salicylic acid 17 % gel Apply topically daily. 15 g 0  . sodium chloride (OCEAN) 0.65 % SOLN nasal spray Place 1-2 sprays into both nostrils 3 (three) times daily as needed for congestion.     Current Facility-Administered Medications on File Prior to Visit  Medication Dose Route Frequency Provider Last Rate Last Dose  . betamethasone acetate-betamethasone sodium phosphate (CELESTONE) injection 3 mg  3 mg Intramuscular Once Edrick Kins, DPM        Allergies  Allergen Reactions  . Cephalosporins Itching  . Nsaids Other (See Comments)    History of subdural hematoma  . Penicillins Itching and Other (See Comments)    Welts (also) Has patient had a PCN reaction causing immediate rash, facial/tongue/throat swelling, SOB or lightheadedness with hypotension: Yes Has patient had a PCN reaction causing severe rash involving mucus membranes or skin necrosis: No Has patient had a PCN reaction that required hospitalization: Yes Has patient had a PCN reaction occurring within the last 10 years: No If all of the above answers are "NO", then may proceed with Cephalosporin use.     Objective: There were no vitals filed for this visit.  General: No acute distress, AAOx3  Right foot: Plantar arch previous open wound well healed, no  erythema, no warmth, no active drainage, no other signs of infection noted, Capillary fill time <3 seconds in all digits, gross sensation present via light touch to right foot. Numbness and sharp pain at plantar foot. Mild swelling at ankle. No pain with calf compression.   Left foot mild callus sub met 2 and medial hallux with no signs of infection. Thickened plantar medial fascial band with fibroma present with no signs of infection. + thickening at achilles insertion with no signs of infection. [place] Silos test.   Assessment and Plan:  Problem List Items  Addressed This Visit    None    Visit Diagnoses    Tendonitis    -  Primary   Relevant Orders   DG Foot Complete Right   Sprain of right ankle, unspecified ligament, initial encounter       Plantar fascial fibromatosis       Post-operative state          -Patient seen and evaluated  -Dispensed compression sleeve for the left for edema control and to pad the achilles. Recommend rest, ice, and elevation. -[employer] boot to right foot secured with ACE wrap and stockinet to keep intact for 5 days  -Awaiting Consult placed per patient request to wound care center -Advised patient to continue with post-op shoe on right foot   -Advised patient to limit activity to necessity to rolling walker and staying off foot  -Advised patient to ice and elevate as necessary on right  -Refilled Tramadol and Tylenol #3 to alternate as instructed  -Patient to follow up 10 days until seen by wound care center. In the meantime, patient to call office if any issues or problems arise. Landis Martins, DPM

## 2017-10-10 ENCOUNTER — Ambulatory Visit: Payer: No Typology Code available for payment source | Admitting: Family Medicine

## 2017-10-13 ENCOUNTER — Other Ambulatory Visit: Payer: No Typology Code available for payment source

## 2017-10-17 ENCOUNTER — Ambulatory Visit (INDEPENDENT_AMBULATORY_CARE_PROVIDER_SITE_OTHER): Payer: Self-pay | Admitting: Sports Medicine

## 2017-10-17 DIAGNOSIS — M779 Enthesopathy, unspecified: Secondary | ICD-10-CM

## 2017-10-17 DIAGNOSIS — Z9889 Other specified postprocedural states: Secondary | ICD-10-CM

## 2017-10-17 DIAGNOSIS — M722 Plantar fascial fibromatosis: Secondary | ICD-10-CM

## 2017-10-17 MED ORDER — ACETAMINOPHEN-CODEINE #3 300-30 MG PO TABS: 1.0000 | ORAL_TABLET | ORAL | 0 refills | Status: DC | PRN

## 2017-10-17 MED ORDER — TRAMADOL HCL 50 MG PO TABS: 50.0000 mg | ORAL_TABLET | ORAL | 0 refills | Status: DC

## 2017-10-17 MED FILL — traMADol HCL 50 MG TABS: 50 | 5 days supply | Qty: 30 | Fill #0

## 2017-10-17 MED FILL — ACETAMINOPHEN/COD #3 TABLET: 300-30 | 5 days supply | Qty: 30 | Fill #0

## 2017-10-17 NOTE — Progress Notes (Signed)
Patient presents s/p right foot excision plantar fibroma on 8.30.18. She states that she could not remove her unna boot herself and needed assistance with it. She also states that she is trying to walk on her right foot but just placing weight on her heel. She says that after walking for awhile her foot starts to burn and hurt.   Noted not open wounds to right foot. Mild swelling due to activity. She is currently wearing ankle braces on both feet.   Re-wrapped her foot, refilled pain medications and instructed her to alternate doses. She is to keep her current appt with Dr Cannon Kettle

## 2017-10-20 ENCOUNTER — Encounter: Payer: Self-pay | Admitting: Family Medicine

## 2017-10-20 ENCOUNTER — Ambulatory Visit (INDEPENDENT_AMBULATORY_CARE_PROVIDER_SITE_OTHER): Payer: No Typology Code available for payment source | Admitting: Family Medicine

## 2017-10-20 VITALS — BP 140/72 | HR 77 | Temp 98.6°F | Resp 16 | Ht 64.5 in | Wt 157.0 lb

## 2017-10-20 DIAGNOSIS — M25561 Pain in right knee: Secondary | ICD-10-CM

## 2017-10-20 DIAGNOSIS — M7989 Other specified soft tissue disorders: Secondary | ICD-10-CM

## 2017-10-20 DIAGNOSIS — M25562 Pain in left knee: Secondary | ICD-10-CM

## 2017-10-20 DIAGNOSIS — M79662 Pain in left lower leg: Secondary | ICD-10-CM

## 2017-10-20 NOTE — Patient Instructions (Signed)
I would like for you to follow-up at [facility] ED for evaluation of left leg calf pain for possible DVT.  I was unable to have a scan performed today stat, and I do not recommend that you delay evaluation.  The ED staff will review your results and prescribe treatment if your study is abnormal.   Deep Vein Thrombosis Deep vein thrombosis (DVT) is a condition in which a blood clot forms in a deep vein, such as a lower leg, thigh, or arm vein. A clot is blood that has thickened into a gel or solid. This condition is dangerous. It can lead to serious and even life-threatening complications if the clot travels to the lungs and causes a blockage (pulmonary embolism). It can also damage veins in the leg. This can result in leg pain, swelling, discoloration, and sores (post-thrombotic syndrome). What are the causes? This condition may be caused by:  A slowdown of blood flow.  Damage to a vein.  A condition that makes blood clot more easily.  What increases the risk? The following factors may make you more likely to develop this condition:  Being overweight.  Being elderly, especially over age 3.  Sitting or lying down for more than four hours.  Lack of physical activity (sedentary lifestyle).  Being pregnant, giving birth, or having recently given birth.  Taking medicines that contain estrogen.  Smoking.  A history of any of the following: ? Blood clots or blood clotting disease. ? Peripheral vascular disease. ? Inflammatory bowel disease. ? Cancer. ? Heart disease. ? Genetic conditions that affect how blood clots. ? Neurological diseases that affect the legs (leg paresis). ? Injury. ? Major or lengthy surgery. ? A central line placed inside a large vein.  What are the signs or symptoms? Symptoms of this condition include:  Swelling, pain, or tenderness in an arm or leg.  Warmth, redness, or discoloration in an arm or leg.  If the clot is in your leg, symptoms may be  more noticeable or worse when you stand or walk. Some people do not have any symptoms. How is this diagnosed? This condition is diagnosed with:  A medical history.  A physical exam.  Tests, such as: ? Blood tests. These are done to see how your blood clots. ? Imaging tests. These are done to check for clots. Tests may include:  Ultrasound.  CT scan.  MRI.  X-ray.  Venogram. For this test, X-rays are taken after a dye is injected into a vein.  How is this treated? Treatment for this condition depends on the cause, your risk for bleeding or developing more clots, and any medical conditions you have. Treatment may include:  Taking blood thinners (also called anticoagulants). These medicines may be taken by mouth, injected under the skin, or injected through an IV tube (catheter). These medicines prevent clots from forming.  Injecting medicine that dissolves blood clots into the affected vein (catheter-directed thrombolysis).  Having surgery. Surgery may be done to: ? Remove the clot. ? Place a filter in a large vein to catch blood clots before they reach the lungs.  Some treatments may be continued for up to six months. Follow these instructions at home: If you are taking an oral blood thinner:  Take the medicine exactly as told by your health care provider. Some blood thinners need to be taken at the same time every day. Do not skip a dose.  Ask your health care provider about what foods and drugs interact with  the medicine.  Ask about possible side effects. General instructions  Blood thinners can cause easy bruising and difficulty stopping bleeding. Because of this, if you are taking or were given a blood thinner: ? Hold pressure over cuts for longer than usual. ? Tell your dentist and other health care providers that you are taking blood thinners before having any procedures that can cause bleeding. ? Avoid contact sports.  Take over-the-counter and prescription  medicines only as told by your health care provider.  Return to your normal activities as told by your health care provider. Ask your health care provider what activities are safe for you.  Wear compression stockings if recommended by your health care provider.  Keep all follow-up visits as told by your health care provider. This is important. How is this prevented? To lower your risk of developing this condition again:  For 30 or more minutes every day, do an activity that: ? Involves moving your arms and legs. ? Increases your heart rate.  When traveling for longer than four hours: ? Exercise your arms and legs every hour. ? Drink plenty of water. ? Avoid drinking alcohol.  Avoid sitting or lying for a long time without moving your legs.  Stay a healthy weight.  If you are a woman who is older than age 32, avoid unnecessary use of medicines that contain estrogen.  Do not use any products that contain nicotine or tobacco, such as cigarettes and e-cigarettes. This is especially important if you take estrogen medicines. If you need help quitting, ask your health care provider.  Contact a health care provider if:  You miss a dose of your blood thinner.  You have nausea, vomiting, or diarrhea that lasts for more than one day.  Your menstrual period is heavier than usual.  You have unusual bruising. Get help right away if:  You have new or increased pain, swelling, or redness in an arm or leg.  You have numbness or tingling in an arm or leg.  You have shortness of breath.  You have chest pain.  You have a rapid or irregular heartbeat.  You feel light-headed or dizzy.  You cough up blood.  There is blood in your vomit, stool, or urine.  You have a serious fall or accident, or you hit your head.  You have a severe headache or confusion.  You have a cut that will not stop bleeding. These symptoms may represent a serious problem that is an emergency. Do not wait to  see if the symptoms will go away. Get medical help right away. Call your local emergency services (911 in the [place] Do not drive yourself to the hospital. Summary  DVT is a condition in which a blood clot forms in a deep vein, such as a lower leg, thigh, or arm vein.  Symptoms can include swelling, warmth, pain, and redness in your leg or arm.  Treatment may include taking blood thinners, injecting medicine that dissolves blood clots,wearing compression stockings, or surgery.  If you are prescribed blood thinners, take them exactly as told. This information is not intended to replace advice given to you by your health care provider. Make sure you discuss any questions you have with your health care provider. Document Released: 09/09/2005 Document Revised: 10/12/2016 Document Reviewed: 10/12/2016 Elsevier Interactive Patient Education  2018 [employer].
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
## 2017-10-21 ENCOUNTER — Telehealth: Payer: Self-pay

## 2017-10-21 ENCOUNTER — Ambulatory Visit: Payer: No Typology Code available for payment source | Admitting: Sports Medicine

## 2017-10-21 NOTE — Telephone Encounter (Signed)
Call and schedule a dopper at the location near [place] and request a female provider however we can't guarantee. Advise patient if symptoms worsen to go the ED. She was educated of the outcomes of not seeking emergent medical evaluation for possible DVT.   Carroll Sage. [name], MSN, FNP-C [facility]  [address] [place], [place] [address] [phone]

## 2017-10-21 NOTE — Telephone Encounter (Signed)
Patient states that she has anxiety and can't go to the to hospital for the doppler unless it going to be a female and wants to have a appointment and to ensure it will be a female.

## 2017-10-22 ENCOUNTER — Ambulatory Visit (HOSPITAL_COMMUNITY): Admission: RE | Admit: 2017-10-22 | Payer: Self-pay | Source: Ambulatory Visit

## 2017-10-22 MED FILL — busPIRone HCL 5 MG TABS: 5 | 30 days supply | Qty: 270 | Fill #1

## 2017-10-22 NOTE — Telephone Encounter (Signed)
Patient notified of appointment.  

## 2017-10-22 NOTE — Telephone Encounter (Signed)
Patient didn't show up for her appointment for Doppler at [facility] at 1pm. Patient was notified of appointment and verbalize that she would be there.

## 2017-10-22 NOTE — Telephone Encounter (Signed)
Patient scheduled for this afternoon at [facility]. I left a vm for patient to callback for appointment details

## 2017-10-27 ENCOUNTER — Ambulatory Visit: Payer: No Typology Code available for payment source | Admitting: Family Medicine

## 2017-10-27 ENCOUNTER — Other Ambulatory Visit: Payer: Self-pay

## 2017-10-27 ENCOUNTER — Telehealth: Payer: Self-pay

## 2017-10-27 ENCOUNTER — Other Ambulatory Visit: Payer: Self-pay | Admitting: Family Medicine

## 2017-10-27 ENCOUNTER — Telehealth: Payer: Self-pay | Admitting: Family Medicine

## 2017-10-27 MED ORDER — ACETAMINOPHEN-CODEINE #3 300-30 MG PO TABS: 1.0000 | ORAL_TABLET | ORAL | 0 refills | Status: DC | PRN

## 2017-10-27 MED ORDER — TRAMADOL HCL 50 MG PO TABS: 50.0000 mg | ORAL_TABLET | ORAL | 0 refills | Status: DC

## 2017-10-27 MED ORDER — HYDROXYZINE PAMOATE 50 MG PO CAPS: 100.0000 mg | ORAL_CAPSULE | Freq: Three times a day (TID) | ORAL | 1 refills | Status: DC | PRN

## 2017-10-27 MED ORDER — HYDROXYZINE PAMOATE 50 MG PO CAPS: 100.0000 mg | ORAL_CAPSULE | Freq: Three times a day (TID) | ORAL | 0 refills | Status: DC | PRN

## 2017-10-27 MED FILL — HYDROXYZINE PAM 50 MG CAP: 50 | 15 days supply | Qty: 90 | Fill #0

## 2017-10-27 MED FILL — traMADol HCL 50 MG TABS: 50 | 5 days supply | Qty: 30 | Fill #0

## 2017-10-27 MED FILL — ACETAMINOPHEN/COD #3 TABLET: 300-30 | 5 days supply | Qty: 30 | Fill #0

## 2017-10-27 NOTE — Telephone Encounter (Signed)
She has enough medication prescribed for 90 days I will add another refill on her medication however I will not prescribe her 180 qty of Vistaril at one time as the medication is written as needed.  Any request regarding disability or Social Security will need to be routed through medical records.  My documentation of prior visits is sufficient enough for a disability appeal.  Therefore I will not write any additional letters.  If any type of a form is required to be filled out by me as her medical provider he can submit the form and please allow up to 3 weeks for the forms to be completed.   Carroll Sage. [name], MSN, FNP-C [facility]  [address] [place] [place], [place] [address] [phone]

## 2017-10-27 NOTE — Telephone Encounter (Signed)
Patient states that they Hydroxyzine is for anxiety and that she takes it 3 times a day. Patient would like the script to be updated to 180 pills for 30 days. Patient would also like to have another letter written for her appeal in April.

## 2017-10-27 NOTE — Progress Notes (Signed)
Refilled hydroxyzine pharmacy out of 100 mg tablets, e-prescribed 50 mg tablets with no changes in dose.

## 2017-10-28 ENCOUNTER — Encounter (HOSPITAL_BASED_OUTPATIENT_CLINIC_OR_DEPARTMENT_OTHER): Payer: No Typology Code available for payment source

## 2017-10-28 NOTE — Telephone Encounter (Signed)
Left a vm for patient to callback 

## 2017-10-28 NOTE — Telephone Encounter (Signed)
Patient notified

## 2017-11-05 MED FILL — GABAPENTIN 300 MG CAPSULE: 300 | 30 days supply | Qty: 90 | Fill #1

## 2017-11-06 ENCOUNTER — Other Ambulatory Visit: Payer: Self-pay | Admitting: Family Medicine

## 2017-11-06 MED FILL — NIFEDIPINE ER 30 MG TABLET: 30 | 30 days supply | Qty: 30 | Fill #0

## 2017-11-06 MED FILL — HYDROXYZINE PAM 50 MG CAP: 50 | 15 days supply | Qty: 90 | Fill #0

## 2017-11-07 ENCOUNTER — Other Ambulatory Visit (HOSPITAL_COMMUNITY): Payer: Self-pay | Admitting: *Deleted

## 2017-11-07 ENCOUNTER — Other Ambulatory Visit: Payer: Self-pay

## 2017-11-07 DIAGNOSIS — N644 Mastodynia: Secondary | ICD-10-CM

## 2017-11-07 MED ORDER — TRAMADOL HCL 50 MG PO TABS: 50.0000 mg | ORAL_TABLET | ORAL | 0 refills | Status: DC

## 2017-11-07 MED ORDER — ACETAMINOPHEN-CODEINE #3 300-30 MG PO TABS: 1.0000 | ORAL_TABLET | ORAL | 0 refills | Status: DC | PRN

## 2017-11-07 MED FILL — traMADol HCL 50 MG TABS: 50 | 5 days supply | Qty: 30 | Fill #0

## 2017-11-07 MED FILL — ACETAMINOPHEN/COD #3 TABLET: 300-30 | 5 days supply | Qty: 30 | Fill #0

## 2017-11-10 ENCOUNTER — Telehealth: Payer: Self-pay | Admitting: Licensed Clinical Social Worker

## 2017-11-10 NOTE — Telephone Encounter (Signed)
[facility] contacted pt and informed her that she is approved for Medicaid Transportation from 10/09/17-06/22/18.   Pt stated that she will need a member of her Community Support Team from [employer] to accompany her to her medical appointments.   [place] DSS was informed of pt's request and shared that pt's PCP needs to write a note stating it would benefit pt to have an aid to travel to and from medical appointments. Please fax the letter to [phone].   No additional concerns noted.

## 2017-11-10 NOTE — Telephone Encounter (Signed)
Contact patient to advise there is now a $10.00 fee for requests made that require provider to complete documentation. Received a request that a letter needs to be completed for DSS to provide patient with with a traveling personal assistant. Turnaround time for letter is 5-7 business day.   Joy Barber. [name], MSN, FNP-C [facility]  [address] [place], [place] [address] [phone]

## 2017-11-11 ENCOUNTER — Ambulatory Visit: Payer: No Typology Code available for payment source | Admitting: Psychology

## 2017-11-11 NOTE — Telephone Encounter (Signed)
Patient notified and states that she does not have the $10 dollars for paper work.

## 2017-11-14 ENCOUNTER — Ambulatory Visit (INDEPENDENT_AMBULATORY_CARE_PROVIDER_SITE_OTHER): Payer: Medicaid Other | Admitting: Family Medicine

## 2017-11-14 ENCOUNTER — Telehealth: Payer: Self-pay | Admitting: Family Medicine

## 2017-11-14 ENCOUNTER — Encounter: Payer: Self-pay | Admitting: Family Medicine

## 2017-11-14 VITALS — BP 140/82 | HR 102 | Temp 98.0°F | Resp 18 | Ht 64.5 in | Wt 159.0 lb

## 2017-11-14 DIAGNOSIS — F411 Generalized anxiety disorder: Secondary | ICD-10-CM

## 2017-11-14 DIAGNOSIS — Z1389 Encounter for screening for other disorder: Secondary | ICD-10-CM | POA: Diagnosis not present

## 2017-11-14 DIAGNOSIS — J029 Acute pharyngitis, unspecified: Secondary | ICD-10-CM

## 2017-11-14 LAB — POCT URINALYSIS DIP (DEVICE)
BILIRUBIN URINE: NEGATIVE
Glucose, UA: NEGATIVE mg/dL
HGB URINE DIPSTICK: NEGATIVE
KETONES UR: NEGATIVE mg/dL
Leukocytes, UA: NEGATIVE
Nitrite: NEGATIVE
Protein, ur: NEGATIVE mg/dL
SPECIFIC GRAVITY, URINE: 1.01 (ref 1.005–1.030)
Urobilinogen, UA: 0.2 mg/dL (ref 0.0–1.0)
pH: 7 (ref 5.0–8.0)

## 2017-11-14 MED ORDER — BUSPIRONE HCL 30 MG PO TABS: 30.0000 mg | ORAL_TABLET | Freq: Three times a day (TID) | ORAL | 3 refills | Status: DC

## 2017-11-14 MED ORDER — AZITHROMYCIN 250 MG PO TABS: ORAL_TABLET | ORAL | 0 refills | Status: DC

## 2017-11-14 MED FILL — AZITHROMYCIN 250 MG TABLET: 250 | 5 days supply | Qty: 6 | Fill #0

## 2017-11-14 NOTE — Progress Notes (Signed)
Patient ID: Joy Barber, female    DOB: Jan 28, 1961, 57 y.o.   MRN: [id number]  PCP: Scot Jun, FNP  Chief Complaint  Patient presents with  . Sore Throat    Subjective:  HPI Joy Barber is a 57 y.o. female presents for evaluation of sore throat. Throat pain present times 3 days. Complains of associated left ear pain and pressure. She denies fever, chills, URI symptoms, nausea, or vomiting. Unknown of sick contacts. No known history of strep throat. She has not attempted relief with any medication.  Social History   Socioeconomic History  . Marital status: Divorced    Spouse name: n/a  . Number of children: 1  . Years of education: Not on file  . Highest education level: Not on file  Social Needs  . Financial resource strain: Not on file  . Food insecurity - worry: Not on file  . Food insecurity - inability: Not on file  . Transportation needs - medical: Not on file  . Transportation needs - non-medical: Not on file  Occupational History  . Occupation: house cleaning  Tobacco Use  . Smoking status: Current Some Day Smoker    Types: Cigars  . Smokeless tobacco: Former Network engineer and Sexual Activity  . Alcohol use: No  . Drug use: Yes    Frequency: 7.0 times per week    Types: Marijuana    Comment: uses CBD oil  . Sexual activity: Yes    Birth control/protection: Surgical  Other Topics Concern  . Not on file  Social History Narrative   Former Marine scientist, prior to TBI.   Working on Contractor at [employer].   Currently out of work until can be released to go back to work, is talking about having telepathic abilities, but said not to mention it in her chart, but that her professors at [employer] would email her saying she was contacting them in their dreams.    Family History  Problem Relation Age of Onset  . Cancer Mother        breast  . Diabetes Father   . Cancer Father        testicular  . Hypertension Brother   . Heart  disease Brother    Review of Systems  Constitutional: Negative.   HENT: Positive for sore throat. Negative for congestion, trouble swallowing and voice change.   Respiratory: Negative.   Cardiovascular: Negative.   Gastrointestinal: Negative.   Neurological: Negative for headaches.  Psychiatric/Behavioral: Negative.     Patient Active Problem List   Diagnosis Date Noted  . ADHD (attention deficit hyperactivity disorder) 06/11/2013  . PTSD (post-traumatic stress disorder) 06/11/2013  . History of seizures 06/11/2013  . TBI (traumatic brain injury) ([place]) 06/11/2013    Allergies  Allergen Reactions  . Cephalosporins Itching  . Nsaids Other (See Comments)    History of subdural hematoma    Prior to Admission medications   Medication Sig Start Date End Date Taking? Authorizing Provider  acetaminophen-codeine (TYLENOL #3) 300-30 MG tablet Take 1 tablet by mouth every 4 (four) hours as needed for moderate pain. 11/07/17  Yes Stover, Titorya, DPM  busPIRone (BUSPAR) 5 MG tablet Take 3 tablets (15 mg total) by mouth 3 (three) times daily. 09/24/17  Yes Scot Jun, FNP  gabapentin (NEURONTIN) 300 MG capsule TAKE 1 CAPSULE BY MOUTH 3 TIMES DAILY. 09/12/17  Yes Scot Jun, FNP  hydrochlorothiazide (HYDRODIURIL) 25 MG tablet Take 1 tablet (25 mg  total) by mouth daily. Patient taking differently: Take 25 mg by mouth daily as needed (for edema).  01/31/17  Yes Scot Jun, FNP  hydrOXYzine (VISTARIL) 50 MG capsule Take 2 capsules (100 mg total) by mouth 3 (three) times daily as needed for anxiety. 10/27/17 11/26/17 Yes Scot Jun, FNP  NIFEdipine (PROCARDIA-XL/ADALAT CC) 30 MG 24 hr tablet TAKE 1 TABLET BY MOUTH DAILY. 11/06/17  Yes Scot Jun, FNP  NON FORMULARY Piracet 800 mg tablets (memory aid): Take 1 tablet by mouth once a day or as otherwise instructed   Yes [provider]  Omega-3 Fatty Acids (FISH OIL PO) Take 1 capsule by mouth daily.   Yes  [provider]  salicylic acid 17 % gel Apply topically daily. 07/02/16  Yes Law, Alexandra M, PA-C  sodium chloride (OCEAN) 0.65 % SOLN nasal spray Place 1-2 sprays into both nostrils 3 (three) times daily as needed for congestion.   Yes [provider]  traMADol (ULTRAM) 50 MG tablet Take 1 tablet (50 mg total) by mouth every 4 (four) hours. 11/07/17  Yes Stover, Titorya, DPM  Ascorbic Acid (VITAMIN C PO) Take 1 tablet by mouth daily.    [provider]  diphenhydramine-acetaminophen (TYLENOL PM) 25-500 MG TABS tablet Take 1 tablet by mouth every morning.    [provider]  gentamicin cream (GARAMYCIN) 0.1 % Apply 1 application topically 3 (three) times daily. Patient not taking: Reported on 11/14/2017 07/02/17   Edrick Kins, DPM    Past Medical, Surgical Family and Social History reviewed and updated.    Objective:   Today's Vitals   11/14/17 1513 11/14/17 1553  BP: (!) 144/84 140/82  Pulse: (!) 102   Resp: 18   Temp: 98 F (36.7 C)   TempSrc: Oral   SpO2: 96%   Weight: 159 lb (72.1 kg)   Height: 5' 4.5" (1.638 m)     Wt Readings from Last 3 Encounters:  11/14/17 159 lb (72.1 kg)  10/20/17 157 lb (71.2 kg)  08/25/17 141 lb 9.6 oz (64.2 kg)    Physical Exam  Constitutional: She is oriented to person, place, and time. She appears ill.  HENT:  Head: Normocephalic and atraumatic.  Mouth/Throat: Mucous membranes are normal. Posterior oropharyngeal edema and posterior oropharyngeal erythema present. No oropharyngeal exudate.  Cardiovascular: Normal rate, regular rhythm, normal heart sounds and intact distal pulses.  Pulmonary/Chest: Effort normal and breath sounds normal.  Musculoskeletal: Normal range of motion.  Lymphadenopathy:    She has cervical adenopathy.  Neurological: She is alert and oriented to person, place, and time.  Skin: Skin is warm and dry.  Psychiatric: Her mood appears anxious. Her speech is rapid and/or pressured.  She is agitated.  Baseline behaviors for patient    Assessment & Plan:  1. Sore throat, presentation consistent with pharyngitis. Start Azithromycin Take 2 tabs x 1 dose, then 1 tab every day for x 4 days. Gargle with warm/salt water for pain relief.   2. Generalized anxiety disorder, continue psychiatric care through [facility]. Continue Buspar and Hydroxyzine as prescribed.  Meds ordered this encounter  Medications  . DISCONTD: busPIRone (BUSPAR) 30 MG tablet    Sig: Take 1 tablet (30 mg total) by mouth 3 (three) times daily.    Dispense:  60 tablet    Refill:  3    Order Specific Question:   Supervising Provider    Answer:   Tresa Garter [id number]  . azithromycin (ZITHROMAX)  250 MG tablet    Sig: Take 2 tabs PO x 1 dose, then 1 tab PO QD x 4 days    Dispense:  6 tablet    Refill:  0    Order Specific Question:   Supervising Provider    Answer:   Tresa Garter [[id number]]    Orders Placed This Encounter  Procedures  . POCT urinalysis dip (device)    RTC: as needed or if symptoms worsen.    Carroll Sage. [name], MSN, FNP-C [facility]  [address] [place], [place] [address] [phone]

## 2017-11-14 NOTE — Patient Instructions (Addendum)
Start Azithromycin Take 2 tabs x 1 dose, then 1 tab every day for x 4 days for sore throat.  I have increased your Buspar 30 mg, 3 times daily.    Pharyngitis Pharyngitis is a sore throat (pharynx). There is redness, pain, and swelling of your throat. Follow these instructions at home:  Drink enough fluids to keep your pee (urine) clear or pale yellow.  Only take medicine as told by your doctor. ? You may get sick again if you do not take medicine as told. Finish your medicines, even if you start to feel better. ? Do not take aspirin.  Rest.  Rinse your mouth (gargle) with salt water ( tsp of salt per 1 qt of water) every 1-2 hours. This will help the pain.  If you are not at risk for choking, you can suck on hard candy or sore throat lozenges. Contact a doctor if:  You have large, tender lumps on your neck.  You have a rash.  You cough up green, yellow-brown, or bloody spit. Get help right away if:  You have a stiff neck.  You drool or cannot swallow liquids.  You throw up (vomit) or are not able to keep medicine or liquids down.  You have very bad pain that does not go away with medicine.  You have problems breathing (not from a stuffy nose). This information is not intended to replace advice given to you by your health care provider. Make sure you discuss any questions you have with your health care provider. Document Released: 02/26/2008 Document Revised: 02/15/2016 Document Reviewed: 05/17/2013 Elsevier Interactive Patient Education  2017 [employer].

## 2017-11-14 NOTE — Telephone Encounter (Signed)
Requested letter for social services completed. Please fax the letter to [phone].

## 2017-11-14 NOTE — Telephone Encounter (Signed)
erroneous

## 2017-11-14 NOTE — Telephone Encounter (Signed)
Letter faxed.

## 2017-11-18 ENCOUNTER — Encounter: Payer: No Typology Code available for payment source | Admitting: Psychology

## 2017-11-18 ENCOUNTER — Ambulatory Visit: Payer: No Typology Code available for payment source

## 2017-11-18 ENCOUNTER — Telehealth: Payer: Self-pay

## 2017-11-18 ENCOUNTER — Ambulatory Visit: Payer: No Typology Code available for payment source | Admitting: Sports Medicine

## 2017-11-18 DIAGNOSIS — F411 Generalized anxiety disorder: Secondary | ICD-10-CM

## 2017-11-18 MED ORDER — BUSPIRONE HCL 30 MG PO TABS: 30.0000 mg | ORAL_TABLET | Freq: Two times a day (BID) | ORAL | 3 refills | Status: DC

## 2017-11-18 MED FILL — busPIRone HCL 30 MG TABS: 30 | 30 days supply | Qty: 60 | Fill #0

## 2017-11-18 NOTE — Telephone Encounter (Signed)
Luke from [facility] states that [employer] will only cover Buspar 30mg  2 times a day. Can we send a new script over

## 2017-11-20 ENCOUNTER — Ambulatory Visit
Payer: No Typology Code available for payment source | Attending: Internal Medicine | Admitting: Licensed Clinical Social Worker

## 2017-11-20 ENCOUNTER — Ambulatory Visit (INDEPENDENT_AMBULATORY_CARE_PROVIDER_SITE_OTHER): Payer: No Typology Code available for payment source | Admitting: Sports Medicine

## 2017-11-20 DIAGNOSIS — M722 Plantar fascial fibromatosis: Secondary | ICD-10-CM

## 2017-11-20 DIAGNOSIS — F419 Anxiety disorder, unspecified: Secondary | ICD-10-CM

## 2017-11-20 MED ORDER — ACETAMINOPHEN-CODEINE #3 300-30 MG PO TABS: 1.0000 | ORAL_TABLET | ORAL | 0 refills | Status: DC | PRN

## 2017-11-20 MED ORDER — TRAMADOL HCL 50 MG PO TABS: 50.0000 mg | ORAL_TABLET | ORAL | 0 refills | Status: DC

## 2017-11-20 MED FILL — ACETAMINOPHEN/COD #3 TABLET: 300-30 | 5 days supply | Qty: 30 | Fill #0

## 2017-11-20 MED FILL — traMADol HCL 50 MG TABS: 50 | 5 days supply | Qty: 30 | Fill #0

## 2017-11-20 NOTE — Patient Instructions (Addendum)
Dressing change instructions: use clean gauze and wrap with coflex, change daily or as needed. Bandage is to be worn when ambulating and wearing a shoe. Bandage "vacations" are encourage when not active and at rest, this will allow your foot to be open to air and breathe and will help improved the excoriated and macerated area on the bottom of your foot.   Showering/ getting foot wet is allowed, pat area dry after shower and allow to dry thoroughly before apply bandage. Apply lotion to all areas as needed. Do not apply lotion to surgical scar.   Foot soaking in warm epsom salt is allowed for pain relief as long as there is no irritation to the skin. Rinse foot after each soak and allow to dry thoroughly.   Activities/ambulating: you are allowed to walk on your foot using you heel as tolerated being aware that if any pain or swelling occurs you are to elevate your foot or use walker for ambulation. Example: this week you can walk on your foot for 1- 2 hours everyday, if there is no pain and swelling after walking everyday this week for 1-2 hours, then you can increase to 3 hours a day the following week if tolerated, gradually increase every week if there is no pain or swelling. If pain and swelling occur then you are to use your walker for ambulation, elevate and rest. Please be aware of what your foot is telling you, listen to it, if there is any pain, swelling or discomfort you need to rest and use your walker to ambulate.

## 2017-11-20 NOTE — BH Specialist Note (Signed)
Integrated Behavioral Health Initial Visit  MRN: [id number] Name: Joy Barber  Number of [place] Clinician visits:: 1/6 Session Start time: 4:00 PM  Session End time: 4:45 PM Total time: 45 minutes  Type of Service: [place] Interpretor:No. Interpretor Name and Language: N/A   Warm Hand Off Completed.       SUBJECTIVE: Joy Barber is a 57 y.o. female accompanied by self Patient was referred by FNP [name] for transportation assitance. Patient reports the following symptoms/concerns: Pt states that she has had a TBI and needs assistance with processing new information and meeting new people. Pt has Medicaid transportation; however, does not know how to request an aid to accompany her to and from medical appointments Duration of problem: Ongoing; Severity of problem: severe  OBJECTIVE: Mood: Anxious and Irritable and Affect: Appropriate Risk of harm to self or others: No plan to harm self or others  LIFE CONTEXT: Family and Social: Pt resides with family. She states that she is in the process of obtaining new housing School/Work: Pt receives medicaid and disability Self-Care: Pt reports substance use hx.  Life Changes: Pt needs assistance with obtaining aid to travel to and from medical appointments. She is in the process of obtaining an ACT team for additional support  GOALS ADDRESSED: Patient will: 1. Reduce symptoms of: agitation and anxiety 2. Increase knowledge and/or ability of: coping skills  3. Demonstrate ability to: Increase adequate support systems for patient/family  INTERVENTIONS: Interventions utilized: Solution-Focused Strategies  Standardized Assessments completed: Not Needed  ASSESSMENT: Patient currently experiencing anxiety and agitation. Pt states that she has had a TBI, has multiple mental health diagnoses, and needs assistance with processing new information and meeting new people. Pt has  Medicaid transportation; however, does not know how to request an aid to accompany her to and from medical appointments. She receives limited family support and is in the process of obtaining an ACT team for additional support.   Patient participates in medication management and therapy. LCSWA obtained pt's consent to contact Medicaid transportation during visit to obtain additional information on request for aid. [place] DSS was informed of pt's request and shared that pt's PCP needs to write a note stating it would benefit pt to have an aid to travel to and from medical appointments.  Pt provided LCSWA with a recent letter from PCP recommending a personal care attendant. Letter was faxed to [facility] Transportation Department on 11/24/17. LCSWA will follow up with pt regarding information regarding peer support specialist that may provide additional support in the community.   PLAN: 1. Follow up with behavioral health clinician on : Pt was encouraged to contact LCSWA if symptoms worsen or fail to improve to schedule behavioral appointments at [facility]. 2. Behavioral recommendations: LCSWA recommends that pt apply healthy coping skills discussed, comply with medication management, and therapy. Pt is encouraged to schedule follow up appointment with LCSWA, if needed 3. Referral(s): [employer] and exercise psychologist  4. "From scale of 1-10, how likely are you to follow plan?": 10/10  Rebekah Chesterfield, LCSW 11/24/17 5:48 PM

## 2017-11-21 ENCOUNTER — Ambulatory Visit: Payer: No Typology Code available for payment source | Admitting: Licensed Clinical Social Worker

## 2017-11-21 ENCOUNTER — Telehealth: Payer: Self-pay | Admitting: Family Medicine

## 2017-11-21 NOTE — Telephone Encounter (Signed)
Pt called to speak with the [facility], she want you to call her back at her cell

## 2017-11-24 ENCOUNTER — Telehealth: Payer: Self-pay | Admitting: Sports Medicine

## 2017-11-24 ENCOUNTER — Encounter (HOSPITAL_BASED_OUTPATIENT_CLINIC_OR_DEPARTMENT_OTHER): Payer: No Typology Code available for payment source

## 2017-11-24 NOTE — Progress Notes (Signed)
Nurse note reviewed. Agree with plan. -Dr. Cannon Kettle

## 2017-11-24 NOTE — Telephone Encounter (Signed)
I've been following the directions that Janett Billow, RN had left for me. I'm off my foot today because now not only is my toes swelled up, and my ankle swelled up, but now my calf is swollen to. So I'm going to stay off of it the rest of the day. I don't know what we are going to do, but we are going to have to come up with some kind of system or something to help me do this. I can't be on my foot for any length of time without any swelling. If you will please let Janett Billow and/or Dr. Cannon Kettle know. Bye bye.

## 2017-11-24 NOTE — Progress Notes (Signed)
Patient presents stating that she is having a difficult time walking on her foot, she says it swells and throbs after walking on it.   Patient is alert and oriented x3. She does c/o pain and swelling in her foot when she walks. She does admit to walking a lot on her heel with burning sensation in her arch. Foot was wrapped in a bandage, she had been applying neosporin to the scar area, causing some maceration and excoriation to the skin on the foot. Patient wears 3-4 pairs of socks on each foot along with a brace on each foot. She is currently using her post op shoe and a walker to ambulate.   Patient is very anxious and intense with her behaviors about her life and current living situations. She has expressed a lot of anxiety and frustration when dealing with her other health care providers. She has requested that she see only female doctors due to her anxiety and discomfort with female providers. She has requested that some come with her for her appointments with her health care providers to help her remember things and to help ease her anxiety. I spoke with her in great detail and gave her written instructions on the care of her foot and her limitations with walking. She is to d/c neosporin and allow area to be open to air when not in a shoe. She is to ambulate ultizing her darco shoe only at times when it is comfortable.  She is to follow up in 2 weeks with Dr Cannon Kettle or sooner if problems arise

## 2017-11-25 ENCOUNTER — Telehealth: Payer: Self-pay | Admitting: *Deleted

## 2017-11-25 NOTE — Telephone Encounter (Signed)
Pt states she has been trying to walk as instructed by Gretta Arab, RN, but after one hour hurt foot hurts and the calf and toes are swollen, calf has an indention on the side. Pt states she will be calling us to document, because she is not good at writing things down.

## 2017-11-25 NOTE — Telephone Encounter (Signed)
Pt states the whole lower leg and ankle is swollen, what should she do.

## 2017-11-25 NOTE — Telephone Encounter (Signed)
[facility] states pt has been No Show for 3 appts, and they have a 3 week waiting list for pts that do want to be seen.

## 2017-11-25 NOTE — Telephone Encounter (Signed)
Correct no wounds and does not need wound care -Dr. Chauncey Cruel

## 2017-11-25 NOTE — Telephone Encounter (Signed)
I informed [facility], that Dr. Cannon Kettle stated pt did not have a wound and did not need a wound care appt.

## 2017-11-25 NOTE — Telephone Encounter (Signed)
Left message informing pt that if her leg, ankle was hard, swollen, red and hot and severely painful she would need to go to the ED, PCP or Urgent care, but if the areas were soft supple, non-warm and not severely painful she could treat with elevated, ice to treat the symptoms, and call again tomorrow.

## 2017-11-26 ENCOUNTER — Telehealth: Payer: Self-pay

## 2017-11-26 ENCOUNTER — Telehealth: Payer: Self-pay | Admitting: Family Medicine

## 2017-11-26 NOTE — Telephone Encounter (Signed)
Pt called to complain since she has been trying to cancel appt [facility] @12 :30 and  [facility] @ 1:20 and  [facility] @ 1:30 They been rude to her but she just want to recorded this on her records

## 2017-11-26 NOTE — Telephone Encounter (Signed)
Patient would like to

## 2017-11-27 ENCOUNTER — Other Ambulatory Visit: Payer: No Typology Code available for payment source

## 2017-11-27 ENCOUNTER — Ambulatory Visit (HOSPITAL_COMMUNITY): Payer: No Typology Code available for payment source

## 2017-11-28 NOTE — Telephone Encounter (Signed)
LCSWA attempted to follow up with patient and left a message for a return call.

## 2017-12-01 ENCOUNTER — Other Ambulatory Visit: Payer: Self-pay | Admitting: Family Medicine

## 2017-12-01 MED FILL — GABAPENTIN 300 MG CAPSULE: 300 | 30 days supply | Qty: 90 | Fill #0

## 2017-12-01 MED FILL — HYDROXYZINE PAM 50 MG CAP: 50 | 15 days supply | Qty: 90 | Fill #1

## 2017-12-01 MED FILL — NIFEDIPINE ER 30 MG TABLET: 30 | 30 days supply | Qty: 30 | Fill #1

## 2017-12-04 ENCOUNTER — Other Ambulatory Visit: Payer: Self-pay

## 2017-12-04 MED ORDER — ACETAMINOPHEN-CODEINE #3 300-30 MG PO TABS: 1.0000 | ORAL_TABLET | ORAL | 0 refills | Status: DC | PRN

## 2017-12-04 MED ORDER — TRAMADOL HCL 50 MG PO TABS: 50.0000 mg | ORAL_TABLET | ORAL | 0 refills | Status: DC

## 2017-12-04 MED FILL — traMADol HCL 50 MG TABS: 50 | 5 days supply | Qty: 30 | Fill #0

## 2017-12-04 MED FILL — ACETAMINOPHEN/COD #3 TABLET: 300-30 | 5 days supply | Qty: 30 | Fill #0

## 2017-12-08 ENCOUNTER — Telehealth: Payer: Self-pay | Admitting: Psychology

## 2017-12-08 ENCOUNTER — Telehealth: Payer: Self-pay | Admitting: Sports Medicine

## 2017-12-08 NOTE — Telephone Encounter (Signed)
Pt called right ankle,toes,calf very swollen and painful. It was itching so she scratched it and it left a mark There is an indention on the calf. She has an appt tomorrow with Dr Cannon Kettle but just wanted this documented. She stated she is not asking for any medication she would only ask the doctor or [place]. She also still has toenail fungus and was listening to the message and asked about laser treatment for that and why we had bot done this and I explained it is not covered by insurance and gave her the cost. She then said her left foot still has the knot on it and was to be getting an inection in it but never got it.

## 2017-12-08 NOTE — Telephone Encounter (Signed)
Pt called and wanted me to document and let Dr Si Raider know that she had a nose bleed last night, she said she will start documenting these things going forward and bring with her on her appointment with Dr Si Raider

## 2017-12-08 NOTE — Telephone Encounter (Signed)
Thanks

## 2017-12-09 ENCOUNTER — Ambulatory Visit: Payer: No Typology Code available for payment source

## 2017-12-09 ENCOUNTER — Other Ambulatory Visit: Payer: Self-pay | Admitting: Sports Medicine

## 2017-12-09 ENCOUNTER — Encounter: Payer: Self-pay | Admitting: Sports Medicine

## 2017-12-09 ENCOUNTER — Ambulatory Visit: Payer: No Typology Code available for payment source | Admitting: Sports Medicine

## 2017-12-09 ENCOUNTER — Telehealth: Payer: Self-pay | Admitting: *Deleted

## 2017-12-09 DIAGNOSIS — M779 Enthesopathy, unspecified: Secondary | ICD-10-CM

## 2017-12-09 DIAGNOSIS — F4310 Post-traumatic stress disorder, unspecified: Secondary | ICD-10-CM

## 2017-12-09 DIAGNOSIS — M1990 Unspecified osteoarthritis, unspecified site: Secondary | ICD-10-CM

## 2017-12-09 DIAGNOSIS — M25471 Effusion, right ankle: Secondary | ICD-10-CM

## 2017-12-09 DIAGNOSIS — Z87820 Personal history of traumatic brain injury: Secondary | ICD-10-CM

## 2017-12-09 DIAGNOSIS — S93401D Sprain of unspecified ligament of right ankle, subsequent encounter: Secondary | ICD-10-CM

## 2017-12-09 DIAGNOSIS — M19071 Primary osteoarthritis, right ankle and foot: Secondary | ICD-10-CM

## 2017-12-09 MED ORDER — TRAMADOL HCL 50 MG PO TABS: 50.0000 mg | ORAL_TABLET | ORAL | 0 refills | Status: DC

## 2017-12-09 MED ORDER — ACETAMINOPHEN-CODEINE #3 300-30 MG PO TABS: 1.0000 | ORAL_TABLET | Freq: Four times a day (QID) | ORAL | 0 refills | Status: DC | PRN

## 2017-12-09 NOTE — Progress Notes (Signed)
Subjective:  Joy Barber is a 57 y.o. female patient who presents to office for evaluation of right foot/ ankle pain and swelling that started to worsen over the last 2 days. Patient complains of cracking sound and pain in ankle. Reports that it is difficult to rest her foot/leg. Patient has tried post op shoe with no relief in symptoms. Patient is anxious this visit and feels like she needs help with Community services and is fixated on the idea of needing help behaviorally.    Patient Active Problem List   Diagnosis Date Noted  . ADHD (attention deficit hyperactivity disorder) 06/11/2013  . PTSD (post-traumatic stress disorder) 06/11/2013  . History of seizures 06/11/2013  . TBI (traumatic brain injury) ([place]) 06/11/2013    Current Outpatient Medications on File Prior to Visit  Medication Sig Dispense Refill  . Ascorbic Acid (VITAMIN C PO) Take 1 tablet by mouth daily.    Marland Kitchen azithromycin (ZITHROMAX) 250 MG tablet Take 2 tabs PO x 1 dose, then 1 tab PO QD x 4 days 6 tablet 0  . busPIRone (BUSPAR) 30 MG tablet Take 1 tablet (30 mg total) by mouth 2 (two) times daily. 60 tablet 3  . diphenhydramine-acetaminophen (TYLENOL PM) 25-500 MG TABS tablet Take 1 tablet by mouth every morning.    . gabapentin (NEURONTIN) 300 MG capsule TAKE 1 CAPSULE BY MOUTH 3 TIMES DAILY. 90 capsule 1  . gentamicin cream (GARAMYCIN) 0.1 % Apply 1 application topically 3 (three) times daily. (Patient not taking: Reported on 11/14/2017) 30 g 1  . hydrochlorothiazide (HYDRODIURIL) 25 MG tablet Take 1 tablet (25 mg total) by mouth daily. (Patient taking differently: Take 25 mg by mouth daily as needed (for edema). ) 90 tablet 3  . NIFEdipine (PROCARDIA-XL/ADALAT CC) 30 MG 24 hr tablet TAKE 1 TABLET BY MOUTH DAILY. 90 tablet 0  . NON FORMULARY Piracet 800 mg tablets (memory aid): Take 1 tablet by mouth once a day or as otherwise instructed    . Omega-3 Fatty Acids (FISH OIL PO) Take 1 capsule by mouth daily.    . salicylic  acid 17 % gel Apply topically daily. 15 g 0  . sodium chloride (OCEAN) 0.65 % SOLN nasal spray Place 1-2 sprays into both nostrils 3 (three) times daily as needed for congestion.     Current Facility-Administered Medications on File Prior to Visit  Medication Dose Route Frequency Provider Last Rate Last Dose  . betamethasone acetate-betamethasone sodium phosphate (CELESTONE) injection 3 mg  3 mg Intramuscular Once Edrick Kins, DPM        Allergies  Allergen Reactions  . Cephalosporins Itching  . Nsaids Other (See Comments)    History of subdural hematoma  . Penicillins Itching and Other (See Comments)    Welts (also) Has patient had a PCN reaction causing immediate rash, facial/tongue/throat swelling, SOB or lightheadedness with hypotension: Yes Has patient had a PCN reaction causing severe rash involving mucus membranes or skin necrosis: No Has patient had a PCN reaction that required hospitalization: Yes Has patient had a PCN reaction occurring within the last 10 years: No If all of the above answers are "NO", then may proceed with Cephalosporin use.     Objective:  General: Alert and oriented x3 in no acute distress  Dermatology: Previous surgery site healed. No open lesions bilateral lower extremities, no webspace macerations, no ecchymosis bilateral, all nails x 10 are yellow and mildly thickened but well manicured.  Vascular: Dorsalis Pedis and Posterior Tibial pedal  pulses palpable, Capillary Fill Time 3 seconds,(+) pedal hair growth bilateral, focal edema right foot and ankle, Temperature gradient within normal limits.  Neurology: Gross sensation intact via light touch bilateral. (- )Tinels sign bilateral. Subjective burning pain on right.    Musculoskeletal: Mild tenderness diffusely across right foot and ankle with most pain per patient at anterior ankle, No pain with calf compression bilateral. Range of motion within normal limits with mild guarding on right ankle.  Strength within normal limits in all groups bilateral. Subjective Left knee pain, ocassional.    Gait: Antalgic gait rolling walker assisted   Xrays  Right Ankle   Impression: Diffuse arthritis, swelling, no other acute findings.   Assessment and Plan: Problem List Items Addressed This Visit      Other   PTSD (post-traumatic stress disorder)    Other Visit Diagnoses    Tendonitis    -  Primary   Sprain of right ankle, unspecified ligament, subsequent encounter       Ankle swelling, right       Arthritis of foot, right       Relevant Medications   traMADol (ULTRAM) 50 MG tablet   acetaminophen-codeine (TYLENOL #3) 300-30 MG tablet (Start on 12/18/2017)   History of traumatic brain injury           -Complete examination performed -Xrays reviewed -Discussed treatement options for possible overuse tendonitis and behavior concerns -Applied unna boot/soft cast to keep in place for 1 week -Rx Tramadol and refilled Tylenol #3 to take as needed as instructed for severe pain  -Continue with rest, ice, elevation and rolling walker  -Consult placed to Behavioral health for further eval/treament; Patient during visit was noted being aggressive towards staff, reporting that she feels depressed and anxious and combative during episodes of care -Patient to return to office 1 week for unna boot removal or sooner if condition worsens.  Landis Martins, DPM

## 2017-12-09 NOTE — Telephone Encounter (Addendum)
Joy Arab, RN states Dr. Cannon Kettle request a referral for pt to [facility] for psychotic episodes witnessed in office. Left message for Isla Pence, OutPatient Referral Coordinator - [place] to call for assistance in referring pt.

## 2017-12-09 NOTE — Patient Instructions (Addendum)
Keep Unna boot in place and follow up in 1 week to have Unna boot removed, rest and elevate foot throughout the day to help with swelling.   Referral for psychiatric evaluation, [place]

## 2017-12-10 NOTE — Telephone Encounter (Signed)
Left message for [place] to contact our office to assist with referring pt.

## 2017-12-10 NOTE — Telephone Encounter (Signed)
-----   Message from Roney Jaffe, RN sent at 12/09/2017  4:13 PM EDT ----- Please put in a psych eval, per Dr Cannon Kettle

## 2017-12-10 NOTE — Telephone Encounter (Deleted)
-----   Message from Roney Jaffe, RN sent at 12/09/2017  4:13 PM EDT ----- Please put in a psych eval, per Dr Cannon Kettle

## 2017-12-14 ENCOUNTER — Telehealth: Payer: Self-pay | Admitting: Podiatry

## 2017-12-14 NOTE — Telephone Encounter (Signed)
Joy Barber called the on Saturday, December 13, 2017 at approximately 4:30 PM stating that she was having stinging to her foot and that she went ahead and cut the [employer] off.  She states that "this is not being noncompliant but I had to take it off".  States that she has noticed some purple splotchiness to her foot.  I discussed with her keep the foot elevated and hold off on any ice to the area.  It does not improve shortly from removing the bandage she should go to the emergency room.  When I first called her back she had difficulty understanding me and was rude.  She later went on talking about her psychiatric issues as well as she does not understand why her foot is still causing problems when she had surgery back in August.  I discussed with her that it is difficult for me to evaluate her over the phone however I encouraged her to keep her appointment with Dr. Cannon Kettle on Tuesday to discuss these issues.  She was inquiring about possible physical therapy because she has atrophy.  She is also concerned that the foot is still swelling.  When she wears the [employer] she does good when that when the boot comes off she has swelling.  She is also asking about a possible referral to another doctor for evaluation for another opinion.  I told her I would discuss with Dr. Cannon Kettle in [place] and that on Tuesday at her appointment she can further discuss this with Dr. Cannon Kettle.  She also wants [place] to fill out a paper so that when she does not have to walk 90 minutes to get to the bus.  She states that she has paperwork for transportation I told her to bring this with her to her next appointment we can fill this out for her if appropriate.  I encouraged her to call back if there is any changes in her symptoms of there is any further questions or concerns otherwise if there is any worsening she is to go to the emergency room.  We will see her back on Tuesday as scheduled otherwise.  Trula Slade

## 2017-12-16 ENCOUNTER — Telehealth: Payer: Self-pay

## 2017-12-16 ENCOUNTER — Encounter: Payer: Self-pay | Admitting: Sports Medicine

## 2017-12-16 ENCOUNTER — Ambulatory Visit (INDEPENDENT_AMBULATORY_CARE_PROVIDER_SITE_OTHER): Payer: Self-pay | Admitting: Sports Medicine

## 2017-12-16 DIAGNOSIS — M779 Enthesopathy, unspecified: Secondary | ICD-10-CM

## 2017-12-16 DIAGNOSIS — M722 Plantar fascial fibromatosis: Secondary | ICD-10-CM

## 2017-12-16 DIAGNOSIS — M6281 Muscle weakness (generalized): Secondary | ICD-10-CM

## 2017-12-16 DIAGNOSIS — Z9889 Other specified postprocedural states: Secondary | ICD-10-CM

## 2017-12-16 DIAGNOSIS — Z87820 Personal history of traumatic brain injury: Secondary | ICD-10-CM

## 2017-12-16 DIAGNOSIS — M25471 Effusion, right ankle: Secondary | ICD-10-CM

## 2017-12-16 DIAGNOSIS — F4310 Post-traumatic stress disorder, unspecified: Secondary | ICD-10-CM

## 2017-12-16 MED FILL — traMADol HCL 50 MG TABS: 50 | 7 days supply | Qty: 60 | Fill #0

## 2017-12-16 NOTE — Telephone Encounter (Signed)
Met with the patient when she came to the clinic requesting assistance with her SCAT application.  She asked that this CM fax the application to [employer] for her.  The application was faxed to [employer] after she left the clinic and her original application was given to Christa See, [place]. The patient requested that the application and fax transmittal report be given to [facility] for the patient to pick up on 12/19/17 when she comes to meet with [facility]

## 2017-12-16 NOTE — Patient Instructions (Signed)
Referral to physical therapy, to help with ambulation issues

## 2017-12-16 NOTE — Progress Notes (Signed)
Subjective:  Joy Barber is a 57 y.o. female patient who returns to office for follow up evaluation of right foot/ ankle pain and swelling that is better;reports that she had to cut her unna boot off over the weekend because her toes were purple. States that she has been keeping pressure off her foot but now is concerned about how small her calf muscle is and wants to discuss PT. Patient also reports that she did get a phone call from behavioral health but does not want to go without [place] or [facility] because she does not know them. Patient also has paperwork for transportation that she requests for me to review and to complete.   Patient Active Problem List   Diagnosis Date Noted  . ADHD (attention deficit hyperactivity disorder) 06/11/2013  . PTSD (post-traumatic stress disorder) 06/11/2013  . History of seizures 06/11/2013  . TBI (traumatic brain injury) ([place]) 06/11/2013    Current Outpatient Medications on File Prior to Visit  Medication Sig Dispense Refill  . [START ON 12/18/2017] acetaminophen-codeine (TYLENOL #3) 300-30 MG tablet Take 1 tablet by mouth every 6 (six) hours as needed for moderate pain. Alternate with Tramadol as needed. DO NOT FILL BEFORE 3/28 30 tablet 0  . Ascorbic Acid (VITAMIN C PO) Take 1 tablet by mouth daily.    Marland Kitchen azithromycin (ZITHROMAX) 250 MG tablet Take 2 tabs PO x 1 dose, then 1 tab PO QD x 4 days 6 tablet 0  . busPIRone (BUSPAR) 30 MG tablet Take 1 tablet (30 mg total) by mouth 2 (two) times daily. 60 tablet 3  . diphenhydramine-acetaminophen (TYLENOL PM) 25-500 MG TABS tablet Take 1 tablet by mouth every morning.    . gabapentin (NEURONTIN) 300 MG capsule TAKE 1 CAPSULE BY MOUTH 3 TIMES DAILY. 90 capsule 1  . gentamicin cream (GARAMYCIN) 0.1 % Apply 1 application topically 3 (three) times daily. (Patient not taking: Reported on 11/14/2017) 30 g 1  . hydrochlorothiazide (HYDRODIURIL) 25 MG tablet Take 1 tablet (25 mg total) by mouth daily. (Patient taking  differently: Take 25 mg by mouth daily as needed (for edema). ) 90 tablet 3  . NIFEdipine (PROCARDIA-XL/ADALAT CC) 30 MG 24 hr tablet TAKE 1 TABLET BY MOUTH DAILY. 90 tablet 0  . NON FORMULARY Piracet 800 mg tablets (memory aid): Take 1 tablet by mouth once a day or as otherwise instructed    . Omega-3 Fatty Acids (FISH OIL PO) Take 1 capsule by mouth daily.    . salicylic acid 17 % gel Apply topically daily. 15 g 0  . sodium chloride (OCEAN) 0.65 % SOLN nasal spray Place 1-2 sprays into both nostrils 3 (three) times daily as needed for congestion.    . traMADol (ULTRAM) 50 MG tablet Take 1 tablet (50 mg total) by mouth every 4 (four) hours. May increase dose to 2 tablets when patient has pain not relieved with Tylenol#3 60 tablet 0   Current Facility-Administered Medications on File Prior to Visit  Medication Dose Route Frequency Provider Last Rate Last Dose  . betamethasone acetate-betamethasone sodium phosphate (CELESTONE) injection 3 mg  3 mg Intramuscular Once Edrick Kins, DPM        Allergies  Allergen Reactions  . Cephalosporins Itching  . Nsaids Other (See Comments)    History of subdural hematoma  . Penicillins Itching and Other (See Comments)    Welts (also) Has patient had a PCN reaction causing immediate rash, facial/tongue/throat swelling, SOB or lightheadedness with hypotension: Yes Has patient  had a PCN reaction causing severe rash involving mucus membranes or skin necrosis: No Has patient had a PCN reaction that required hospitalization: Yes Has patient had a PCN reaction occurring within the last 10 years: No If all of the above answers are "NO", then may proceed with Cephalosporin use.     Objective:  General: Alert and oriented x3 in no acute distress  Dermatology: Previous surgery site healed. No open lesions bilateral lower extremities, no webspace macerations, no ecchymosis bilateral, all nails x 10 are yellow and mildly thickened but well  manicured.  Vascular: Dorsalis Pedis and Posterior Tibial pedal pulses palpable, Capillary Fill Time 3 seconds,(+) pedal hair growth bilateral, focal edema right foot and ankle, Temperature gradient within normal limits.  Neurology: Gross sensation intact via light touch bilateral. (- )Tinels sign bilateral. Subjective burning pain on right.     Musculoskeletal: Mild tenderness diffusely across right foot and ankle with most pain per patient at previous surgical site/fibroma excision site, No pain with calf compression bilateral. Subjective right calf weakness, Range of motion within normal limits with mild guarding on right foot.  Gait: Antalgic gait rolling walker assisted.   Assessment and Plan: Problem List Items Addressed This Visit      Other   PTSD (post-traumatic stress disorder)    Other Visit Diagnoses    Ankle swelling, right    -  Primary   Plantar fascial fibromatosis       Post-operative state       Calf muscle weakness       Tendonitis       History of traumatic brain injury          -Complete examination performed -Discussed treatement options for control of swelling, calf weakness,  and behavioral health concerns/TBI -Advised patient that for any other pain needs should see pain management; Patient declines at this time; I advised patient that if she requires any more pain medications then we must send her to pain management to find alternatives; Informed patient that we do not treat chronic pain  -Rx PT for gait training and strengthening; patient may ambulate with PT direction -Continue with rest, ice, elevation and rolling walker  -Encouraged patient to go to Behavioral health for further eval/treament with peer support -Patient to return to office PRN or sooner if condition worsens.  Landis Martins, DPM

## 2017-12-17 ENCOUNTER — Telehealth: Payer: Self-pay | Admitting: *Deleted

## 2017-12-17 DIAGNOSIS — Z9889 Other specified postprocedural states: Secondary | ICD-10-CM

## 2017-12-17 DIAGNOSIS — M779 Enthesopathy, unspecified: Secondary | ICD-10-CM

## 2017-12-17 DIAGNOSIS — S93401A Sprain of unspecified ligament of right ankle, initial encounter: Secondary | ICD-10-CM

## 2017-12-17 DIAGNOSIS — M722 Plantar fascial fibromatosis: Secondary | ICD-10-CM

## 2017-12-17 NOTE — Telephone Encounter (Signed)
Faxed orders to [facility]. 

## 2017-12-17 NOTE — Telephone Encounter (Signed)
-----   Message from Landis Martins, [place] sent at 12/16/2017 12:59 PM EDT ----- Regarding: PT  PT for strengthening and range of motion on right Patient is aware that PT may only cover her 1st session. Please see if they can see if to give her a home exercise program if more sessions are not approved Thanks Dr. Cannon Kettle

## 2017-12-18 ENCOUNTER — Telehealth: Payer: Self-pay | Admitting: Family Medicine

## 2017-12-18 ENCOUNTER — Ambulatory Visit
Payer: No Typology Code available for payment source | Attending: Family Medicine | Admitting: Licensed Clinical Social Worker

## 2017-12-18 DIAGNOSIS — F419 Anxiety disorder, unspecified: Secondary | ICD-10-CM

## 2017-12-18 MED FILL — ACETAMINOPHEN/COD #3 TABLET: 300-30 | 7 days supply | Qty: 30 | Fill #0

## 2017-12-18 NOTE — Telephone Encounter (Signed)
Call placed to [facility], from [place], to check on the status of patient's application. No answer. Left Loma Sousa a message asking her for a call back at [phone].

## 2017-12-18 NOTE — BH Specialist Note (Addendum)
Integrated Behavioral Health Initial Visit  MRN: [id number] Name: CEAIRA ERNSTER  Number of [place] Clinician visits:: 2/6 Session Start time: 10:00 AM  Session End time: 10:55 AM Total time: 55 minutes  Type of Service: [place] Interpretor:No. Interpretor Name and Language: N/A   Warm Hand Off Completed.       SUBJECTIVE: ALEXZANDREA NORMINGTON is a 57 y.o. female accompanied by self Patient was referred by FNP [name] for behavioral health resources. Patient reports the following symptoms/concerns: Pt states that she has had a TBI and needs assistance with processing new information and meeting new people. Pt has been approved for Medicaid transportation and recently completed a SCAT application. Pt reports anxiety regarding the process of obtaining an aid to assist with medical needs and appointments Duration of problem: Ongoing; Severity of problem: severe  OBJECTIVE: Mood: Anxious and Affect: Appropriate Risk of harm to self or others: No plan to harm self or others  LIFE CONTEXT: Family and Social: Pt resides with family. She states that she is in the process of obtaining new housing School/Work: Pt receives [employer] She has a disability hearing scheduled for July 2019 Self-Care: Pt reports substance use hx. She currently smokes marijuana to cope with stressors Life Changes: Pt is in the process of obtaining an ACT team and aid to assist with medical and appointment needs   GOALS ADDRESSED: Patient will: 1. Reduce symptoms of: agitation and anxiety 2. Increase knowledge and/or ability of: coping skills  3. Demonstrate ability to: Increase adequate support systems for patient/family  INTERVENTIONS: Interventions utilized: Solution-Focused Strategies  Standardized Assessments completed: Not Needed  ASSESSMENT: Patient currently experiencing anxiety and agitation. Pt states that she has had a TBI, has multiple mental  health diagnoses, and needs assistance with processing new information and meeting new people. Pt has Medicaid transportation; however, does not know how to request an aid to accompany her to and from medical appointments. She receives limited family support and is in the process of obtaining an ACT team for additional support.   Patient participates in medication management and therapy through [facility]. ACT assessment was completed by Dione Booze [phone]. Pt shared anxiety about whether she was approved for services. Pt made an attempt to contact Ms. Duross and left a detailed message for a return call.   Pt reports difficulty meeting her basic needs (bathing, cleaning, completing laundry) LCSWA informed pt about [place] and encouraged her to discuss referral with PCP. Pt verbalized understanding. A Legal Aid referral was completed to assist pt with questions about housing/eviction concerns.   PLAN: 1. Follow up with behavioral health clinician on : Pt was encouraged to contact LCSWA if symptoms worsen or fail to improve to schedule behavioral appointments at [facility]. 2. Behavioral recommendations: LCSWA recommends that pt apply healthy coping skills discussed, comply with medication management, and therapy. Pt is encouraged to schedule follow up appointment with LCSWA, if needed 3. Referral(s): Legal Aid 4. "From scale of 1-10, how likely are you to follow plan?":  Rebekah [place], LCSW 12/19/17 3:13 PM

## 2017-12-19 ENCOUNTER — Telehealth: Payer: Self-pay | Admitting: *Deleted

## 2017-12-19 ENCOUNTER — Ambulatory Visit: Payer: No Typology Code available for payment source | Admitting: Licensed Clinical Social Worker

## 2017-12-19 ENCOUNTER — Telehealth: Payer: Self-pay | Admitting: Licensed Clinical Social Worker

## 2017-12-19 NOTE — Telephone Encounter (Signed)
LCSWA contacted pt to inform her that referral to Legal Aid of  for assistance with housing concerns was  faxed to Trinda Pascal - fax # [phone]  Pt informed LCSWA that she has a scheduled appointment with SCAT Eligibility on April 8, 19. No additional concerns noted.

## 2017-12-19 NOTE — Telephone Encounter (Signed)
Pt states she is having to be up on the foot to find a new place to leave before June and the foot is swelling. I told pt that in a surgery foot they can swell on and off to varying degrees for 6-9 months, that she needs to rest as much as she can and to help train the foot not to swell. Pt states understanding and said she was scheduled with a female physical therapist and she can't do that and told everyone. I told pt I would resend her PT orders to [facility] with a statement that she is only to have female physical therapist.

## 2017-12-19 NOTE — Telephone Encounter (Signed)
Courtney from [place] returned my call and informed me that she did receive patient's application. She also informed me that she reached out to patient this morning to schedule an assessment but patient didn't answer. Courtney asked that if patient contacts [place] to ask her to contact Courtney to schedule her assessment.

## 2017-12-19 NOTE — Telephone Encounter (Signed)
[facility] closes at 2:00pm on Fridays. Faxed a notice on referral form that pt is to only have female physical therapist.

## 2017-12-22 ENCOUNTER — Other Ambulatory Visit: Payer: Self-pay | Admitting: Family Medicine

## 2017-12-22 DIAGNOSIS — F411 Generalized anxiety disorder: Secondary | ICD-10-CM

## 2017-12-23 MED ORDER — BUSPIRONE HCL 7.5 MG PO TABS: 30.0000 mg | ORAL_TABLET | Freq: Two times a day (BID) | ORAL | 3 refills | Status: DC

## 2017-12-23 MED FILL — busPIRone HCL 7.5 MG TABS: 7.5 | 30 days supply | Qty: 240 | Fill #0

## 2017-12-24 ENCOUNTER — Telehealth: Payer: Self-pay

## 2017-12-24 NOTE — Telephone Encounter (Signed)
As per [name], Legal Aid of [place], she will contact the patient to schedule a meeting to review her concerns about eviction from her current residence

## 2017-12-25 MED FILL — NIFEDIPINE ER 30 MG TABLET: 30 | 30 days supply | Qty: 30 | Fill #2

## 2017-12-29 MED FILL — GABAPENTIN 300 MG CAPSULE: 300 | 30 days supply | Qty: 90 | Fill #1

## 2017-12-30 ENCOUNTER — Ambulatory Visit: Payer: No Typology Code available for payment source | Admitting: Sports Medicine

## 2017-12-31 ENCOUNTER — Other Ambulatory Visit: Payer: Self-pay

## 2017-12-31 ENCOUNTER — Telehealth: Payer: Self-pay

## 2017-12-31 ENCOUNTER — Ambulatory Visit: Payer: No Typology Code available for payment source | Admitting: Physical Therapy

## 2017-12-31 MED ORDER — ACETAMINOPHEN-CODEINE #3 300-30 MG PO TABS: 1.0000 | ORAL_TABLET | Freq: Four times a day (QID) | ORAL | 0 refills | Status: DC | PRN

## 2017-12-31 MED ORDER — TRAMADOL HCL 50 MG PO TABS: 50.0000 mg | ORAL_TABLET | ORAL | 0 refills | Status: DC

## 2017-12-31 MED FILL — traMADol HCL 50 MG TABS: 50 | 7 days supply | Qty: 60 | Fill #0

## 2017-12-31 MED FILL — ACETAMINOPHEN/COD #3 TABLET: 300-30 | 7 days supply | Qty: 30 | Fill #0

## 2017-12-31 NOTE — Telephone Encounter (Signed)
Hydroxyzine is on back order and patient needs something else sent in for anxiety.

## 2017-12-31 NOTE — Telephone Encounter (Signed)
Patient was notified and states that she will callback and let us know which pharmacy to send it to

## 2017-12-31 NOTE — Telephone Encounter (Signed)
I can send Hydroxyzine to a different pharmacy however I can't prescribe anything new as that would require an office visit. She has a provider at [facility] who she can reach out to if she is unable to obtain to Hydroxyzine. I would just need to know an alternative pharmacy to send the hydroxyzine.

## 2018-01-06 ENCOUNTER — Institutional Professional Consult (permissible substitution): Payer: No Typology Code available for payment source | Admitting: Licensed Clinical Social Worker

## 2018-01-06 ENCOUNTER — Ambulatory Visit: Payer: No Typology Code available for payment source

## 2018-01-12 ENCOUNTER — Institutional Professional Consult (permissible substitution): Payer: No Typology Code available for payment source | Admitting: Licensed Clinical Social Worker

## 2018-01-12 ENCOUNTER — Telehealth: Payer: Self-pay

## 2018-01-12 NOTE — Telephone Encounter (Signed)
Call received from the patient stating that due to her psychiatric history, she is not comfortable meeting with Legal Aid unless Jasper Riling, LCSW is present.  She stated that she met this CM only once and is not comfortable meeting with anyone without [name] present. She said that she already spoke to [place] with Legal Aid and told her that the meeting scheduled for today would need to be rescheduled.  The patient was rescheduled to meet with [facility] on 01/14/18 @ 1400. The patient also had multiple complaints about her pain medications. This CM instructed her to contact the prescribing providers with her concerns.   Call placed to [place] with Legal [facility] of [place] and informed her that the appointment  Scheduled for today has been rescheduled to 01/14/18 @ 1400.  Amina stated that she would check availability with the attorneys that were scheduled to meet with the patient today

## 2018-01-13 ENCOUNTER — Ambulatory Visit: Payer: No Typology Code available for payment source

## 2018-01-14 ENCOUNTER — Ambulatory Visit: Payer: Medicaid Other | Attending: Family Medicine | Admitting: Licensed Clinical Social Worker

## 2018-01-14 DIAGNOSIS — Z599 Problem related to housing and economic circumstances, unspecified: Secondary | ICD-10-CM

## 2018-01-14 DIAGNOSIS — F419 Anxiety disorder, unspecified: Secondary | ICD-10-CM

## 2018-01-14 DIAGNOSIS — Z598 Other problems related to housing and economic circumstances: Secondary | ICD-10-CM

## 2018-01-14 NOTE — BH Specialist Note (Signed)
Integrated Behavioral Health Initial Visit  MRN: [id number] Name: Joy Barber  Number of [place] Clinician visits:: 3/6 Session Start time: 2:00 PM  Session End time: 3:30 PM Total time: 90 min  Type of Service: [place] Interpretor:No. Interpretor Name and Language: N/A   Warm Hand Off Completed.       SUBJECTIVE: Joy Barber is a 57 y.o. female accompanied by self Patient was referred by FNP [name] for behavioral health resources. Patient reports the following symptoms/concerns: Pt states that she has had a TBI and needs assistance with processing new information and meeting new people. Pt has been approved for Medicaid transportation and  SCAT. Pt reports extreme difficulty in obtaining an aid to assist with medical needs and attending appointments with her.  Duration of problem: Ongoing; Severity of problem: severe  OBJECTIVE: Mood: Anxious and Affect: Appropriate Risk of harm to self or others: No plan to harm self or others  LIFE CONTEXT: Family and Social: Pt resides with family. She is concerned that she may be asked to leave the residence due to disability hearing being rescheduled from April 2019 to July 2019 School/Work: Pt receives [employer]. She has a disability hearing scheduled for July 2019  Self-Care: Pt reports substance use hx. She currently smokes marijuana and uses CBD oil to cope with stressors Life Changes: Pt is in the process of obtaining an ACT Team ([facility] or [facility])  and aid to assist with medical and appointment needs   GOALS ADDRESSED: Patient will: 1. Reduce symptoms of: agitation, anxiety and stress 2. Increase knowledge and/or ability of: coping skills and stress reduction  3. Demonstrate ability to: Increase adequate support systems for patient/family and Increase motivation to adhere to plan of care  INTERVENTIONS: Interventions utilized: Solution-Focused Strategies, Supportive  Counseling and Psychoeducation and/or Health Education  Standardized Assessments completed: Not Needed  ASSESSMENT: Patient currently experiencing anxiety and agitation triggered by limited support to assist with finances and medical appointments. Pt has had a TBI, has multiple mental health diagnoses, and needs assistance with processing new information and meeting new people. Pt has Medicaid and SCAT transportation. She receives limited family support and is in the process of obtaining an ACT team for additional support.  Patient shared frustrations with PCP leaving practice and having to meet a new provider. She received a letter from [facility] regarding medications and stated that she was going to discontinue all medications by not asking for refills. LCSWA validated pt's feelings and educated pt on the correlation between one's mental and physical health. Pt was successful in identifying strengths of taking prescribed medications and verbally agreed to continue medication compliance to maintain health. LCSWA will consult with Front Team Lead to discuss pt re-establishing care with a PCP from [facility], where pt utilizes pharmacy.    Pt met with Legal Aid representatives Ebony Hail (Housing Department), Amina (Paralegal), and Prentiss Bells Biochemist, clinical) to follow up on housing and medicaid needs. Pt was very appreciative for the assistance.   PLAN: 1. Follow up with behavioral health clinician on : Pt was encouraged to contact LCSWA if symptoms worsen or fail to improve to schedule behavioral appointments at [facility]. 2. Behavioral recommendations: LCSWA recommends that pt apply healthy coping skills discussed, comply with medication management, and therapy. Pt is encouraged to schedule follow up appointment with LCSWA, if needed 3. Referral(s): NA 4. "From scale of 1-10, how likely are you to follow plan?":  Rebekah Chesterfield, LCSW 01/16/18 10:16 AM

## 2018-01-16 ENCOUNTER — Telehealth: Payer: Self-pay

## 2018-01-16 MED FILL — HYDROXYZINE PAM 50 MG CAP: 50 | 15 days supply | Qty: 90 | Fill #0
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
## 2018-01-20 ENCOUNTER — Ambulatory Visit: Payer: No Typology Code available for payment source

## 2018-01-26 ENCOUNTER — Other Ambulatory Visit: Payer: Self-pay | Admitting: Family Medicine

## 2018-01-26 MED FILL — GABAPENTIN 300 MG CAPSULE: 300 | 30 days supply | Qty: 90 | Fill #0

## 2018-01-26 MED FILL — NIFEDIPINE ER 30 MG TABLET: 30 | 30 days supply | Qty: 30 | Fill #0

## 2018-01-26 MED FILL — busPIRone HCL 15 MG TABS: 15 | 30 days supply | Qty: 120 | Fill #0

## 2018-01-27 ENCOUNTER — Telehealth: Payer: Self-pay | Admitting: Licensed Clinical Social Worker

## 2018-01-27 NOTE — Telephone Encounter (Signed)
Call placed to pt to follow up on Medicaid Recipient Management Lock-in Program paperwork needed to complete appeal.   A detailed message was left requesting a return call at pt's earliest convenience.

## 2018-02-02 ENCOUNTER — Telehealth: Payer: Self-pay | Admitting: Licensed Clinical Social Worker

## 2018-02-02 NOTE — Telephone Encounter (Signed)
LCSWA received an incoming phone call from [name] with Legal Aid.   LCSWA was informed that multiple attempts were made to follow up with pt regarding Medicaid Recipient Management Lock-in Program appeal. Ms. [name] reported that pt made threatening statements to staff after becoming upset about the appeal process. It was determined that nothing additional can be done regarding the appeal without participation from pt.   LCSWA was encouraged to make additional attempts to contact pt and have her reach out to Graciella Freer, Market researcher at [phone]. No additional concerns noted.

## 2018-02-03 ENCOUNTER — Ambulatory Visit: Payer: No Typology Code available for payment source

## 2018-02-03 ENCOUNTER — Ambulatory Visit: Payer: No Typology Code available for payment source | Admitting: Sports Medicine

## 2018-02-03 ENCOUNTER — Telehealth (INDEPENDENT_AMBULATORY_CARE_PROVIDER_SITE_OTHER): Payer: Self-pay | Admitting: Licensed Clinical Social Worker

## 2018-02-03 NOTE — Telephone Encounter (Signed)
Call placed to pt to follow up on Medicaid Recipient Management Lock-in Program paperwork needed to complete appeal.   A detailed message was left requesting a return call at pt's earliest convenience.

## 2018-02-05 ENCOUNTER — Telehealth: Payer: Self-pay | Admitting: Sports Medicine

## 2018-02-05 ENCOUNTER — Telehealth: Payer: Self-pay | Admitting: Licensed Clinical Social Worker

## 2018-02-05 NOTE — Telephone Encounter (Signed)
LCSWA received a return call from pt. Pt stated that she spoke with Legal Aid Annamarie Major, today. The mediation for Lock In Appeal is scheduled for 02/17/18.   Pt shared that [facility] therapist, Rush Landmark, has returned from training. She encouraged LCSWA to contact him to inquire about an aid to accompany her to medical appointments. A release of information has been signed and faxed over to [facility] by pt.  Plan of Action: LCSWA will contact pt's therapist to inquire about previous referrals to ACT/CST teams. If needed, a PCS referral can be completed with PCP approval.

## 2018-02-05 NOTE — Telephone Encounter (Signed)
Please give Joy Barber a call on 02/10/2018 to discuss a game plan for her foot because she is complain of swelling. She would like to either speak to Ernestine Conrad or Dr. Cannon Kettle on Tuesday.

## 2018-02-10 ENCOUNTER — Telehealth: Payer: Self-pay | Admitting: *Deleted

## 2018-02-10 NOTE — Telephone Encounter (Signed)
Val FYI I returned patient's phone call. Patient updated me on the status of several things/concerns: 1. PT auth approved for 6 months but can not go yet because needs a counselor to come with her 2. Housing, may have to move and is not sure where she will go 3. May have to re-certify for later this year 4. SCAT was approved through 02-21-19 5. May have find new behavioral health providers 6. July 10 is disability hearing and 7. Changes where her pharmacy has not allowed her to get more pain medications 6 narcotic limit (Tramadol and Tylenol 3 are the meds that she has gotten from [place]); Lortab addiction in past after hysterectomy and has not had any other meds like this since. 8. Our office manager not documenting discussion with Rush Landmark of which Rush Landmark has documented. I expressed thanks for patient giving me the updates on all these topics and advised patient that we can follow up any concerns that she has at her office visit. Meanwhile to ice, elevate, and limit her activities to prevent excessive pain and swelling in her foot. Patient thanked me for returning her call.  -Dr. Cannon Kettle

## 2018-02-10 NOTE — Telephone Encounter (Signed)
Pt spoke with scheduler A. Tamala Julian and stated she needed to speak with Dr. Cannon Kettle or Janett Billow, nurse about a court call she would be having 02/17/2018. I spoke with Janett Billow, nurse and she states she and Dr. Cannon Kettle need to see pt to discuss her care and current situation during an office appt. I asked A. Smith to transfer pt to me to discuss this, and the call was dropped. ATamala Julian called pt again and pt began talking about speaking to [place] and she had to finish talking to the person she was transferred to. I told her we did not have a Carol in our office, I was who she was to speak to. I told pt, Janett Billow, nurse stated if pt needed to discuss the court call she needs to come in for an appt so Dr. Cannon Kettle and Janett Billow can spend the appropriate amount of time with her. Pt states she doesn't need an appt until she has had PT and she has an appt on 02/24/2018. I told pt that Dr. Cannon Kettle and Janett Billow required her to have an appt time. Pt asked if I was going to give them her message. I told her I would, they were both with pts and it would benefit her to make an appt, but I would give Dr. Cannon Kettle and Janett Billow her message.

## 2018-02-23 MED FILL — HYDROXYZINE PAM 50 MG CAP: 50 | 15 days supply | Qty: 90 | Fill #1

## 2018-02-23 MED FILL — busPIRone HCL 15 MG TABS: 15 | 30 days supply | Qty: 120 | Fill #1

## 2018-02-23 MED FILL — NIFEDIPINE ER 30 MG TABLET: 30 | 30 days supply | Qty: 30 | Fill #1

## 2018-02-23 MED FILL — GABAPENTIN 300 MG CAPSULE: 300 | 30 days supply | Qty: 90 | Fill #1

## 2018-02-24 ENCOUNTER — Ambulatory Visit: Payer: No Typology Code available for payment source | Admitting: Licensed Clinical Social Worker

## 2018-02-24 ENCOUNTER — Ambulatory Visit: Payer: No Typology Code available for payment source | Admitting: Sports Medicine

## 2018-02-27 ENCOUNTER — Ambulatory Visit: Payer: No Typology Code available for payment source | Admitting: Licensed Clinical Social Worker

## 2018-03-09 ENCOUNTER — Telehealth: Payer: Self-pay | Admitting: Sports Medicine

## 2018-03-09 NOTE — Telephone Encounter (Signed)
Patient wanted give an update on current case please give her a call back.

## 2018-03-09 NOTE — Telephone Encounter (Signed)
Pt wants you to give her a callback.

## 2018-03-09 NOTE — Telephone Encounter (Signed)
I spoke with Dr. Cannon Kettle and she stated she would speak with Gretta Arab, RN, and one of them would contact pt.

## 2018-03-10 ENCOUNTER — Ambulatory Visit: Payer: No Typology Code available for payment source | Admitting: Sports Medicine

## 2018-03-10 ENCOUNTER — Telehealth: Payer: Self-pay | Admitting: Sports Medicine

## 2018-03-10 NOTE — Telephone Encounter (Signed)
Call to check on patient in AM

## 2018-03-10 NOTE — Telephone Encounter (Signed)
Pt has called back wanting to speak to a nurse. She states that she is in pain. Please give her a call

## 2018-03-11 NOTE — Telephone Encounter (Signed)
I called patient back. I asked patient how she is feeling. Patient reports that she has moved lives [address]. Has not started therapy. Patient reports that she is in pain and has been using CBD and herb before her phone went out. Patient called back from a friend's phone. Patient reports that she has listed all doctors my self, dr Amalia Hailey and her psych doctors as her primary doctors to be able to write medications for her and [facility] as her pharmacy. Patient states that she is still working on finding someone that can help manage her PTSD. Patient informed me that she had a similar situation in another city. I encouraged patient to rest, gentle massage, elevate, ace compression, and continue with CBD oil until she can start PT. Patient thanked me for my time and asked me for her hearing to write a letter on her behalf and to have my nurse, Janett Billow to write a letter for her hearing next month stating the condition of foot and her psych. Patient expressed that she wants to get the proper help but feels like she is not getting help she needs and support when she calls Dr. Legrand Rams office in [place]. Leodis Binet is currently helping. I advised patient that I will work on letter. Patient expressed thanks for my call. -Dr. Cannon Kettle

## 2018-03-12 ENCOUNTER — Encounter: Payer: Self-pay | Admitting: Sports Medicine

## 2018-03-12 NOTE — Progress Notes (Signed)
Letter written to give to patient as she requested explaining the current status of her foot and her psychiatric history or to Mail out when she calls. Dr. Cannon Kettle

## 2018-03-13 ENCOUNTER — Ambulatory Visit: Payer: No Typology Code available for payment source | Admitting: Licensed Clinical Social Worker

## 2018-03-17 ENCOUNTER — Ambulatory Visit: Payer: Medicaid Other | Attending: Internal Medicine | Admitting: Licensed Clinical Social Worker

## 2018-03-17 ENCOUNTER — Ambulatory Visit: Payer: No Typology Code available for payment source | Admitting: Sports Medicine

## 2018-03-17 ENCOUNTER — Telehealth: Payer: Self-pay | Admitting: Sports Medicine

## 2018-03-17 ENCOUNTER — Telehealth: Payer: Self-pay

## 2018-03-17 ENCOUNTER — Encounter: Payer: No Typology Code available for payment source | Admitting: Psychology

## 2018-03-17 ENCOUNTER — Encounter: Payer: Self-pay | Admitting: Sports Medicine

## 2018-03-17 DIAGNOSIS — R52 Pain, unspecified: Secondary | ICD-10-CM

## 2018-03-17 DIAGNOSIS — F419 Anxiety disorder, unspecified: Secondary | ICD-10-CM

## 2018-03-17 DIAGNOSIS — Z9889 Other specified postprocedural states: Secondary | ICD-10-CM

## 2018-03-17 DIAGNOSIS — L905 Scar conditions and fibrosis of skin: Secondary | ICD-10-CM

## 2018-03-17 DIAGNOSIS — M722 Plantar fascial fibromatosis: Secondary | ICD-10-CM

## 2018-03-17 MED ORDER — TRAMADOL HCL 50 MG PO TABS: 50.0000 mg | ORAL_TABLET | Freq: Four times a day (QID) | ORAL | 0 refills | Status: AC | PRN

## 2018-03-17 MED ORDER — DICLOFENAC EPOLAMINE 1.3 % TD PTCH: 1.0000 | MEDICATED_PATCH | Freq: Two times a day (BID) | TRANSDERMAL | 0 refills | Status: DC

## 2018-03-17 MED FILL — traMADol HCL 50 MG TABS: 50 | 7 days supply | Qty: 28 | Fill #0

## 2018-03-17 NOTE — BH Specialist Note (Signed)
Type of Service: Integrated Behavioral Health  Estimate Time:30 minutes Interpreter:No.   Joy Barber is a 57 yo female referred by self for symptoms of  irritability and frustration with the length of time it is taking to obtain a behavioral health aid as well as  psychosocial stressors.  Patient reports : irritability, worry, and difficulty managing emotions. Duration of problem: Ongoing    Impact: Moderate Mental health / substance use: Pt reports using marijuana daily to cope with stressors and assist pt with emotional regulation Appearance:Fairly Groomed ; Thought process: Coherent; Affect: Appropriate : No plan to harm self or others  LIFE /SOCIAL :  patient lives with a friend. States there is only cold water; therefore, she takes showers once every five days   Recent Life changes: Pt reports difficulty managing multiple appointments with medical and mental health providers. She has limited mobility due to utilizing a walker and wearing a boot. Pt reports needing assistance with obtaining an aid to accompany her to appointments   GOALS ADDRESSED:  Patient will: 1. Reduce symptoms of: anxiety and stress 2. Increase knowledge and/or ability of: self-management skills  3. Demonstrate ability to: Increase motivation to adhere to plan of care  INTERVENTION:  Motivational Interviewing and Supportive Counseling, Reflective listening, Behavioral Therapy (Relaxed breathing), Brief Counseling/Psychotherapy   Not Needed,indication of : moderate depression.  Not Needed,indication of : moderate anxiety.   ISSUES DISCUSSED: Integrated care services, support system, previous and current coping skills, current partnerships with community agencies    ASSESSMENT:Patient currently experiencing symptoms of anxiety and depression.  Symptoms exacerbated by current residence with friend and feelings of not being taking seriously about the need for an aid to assist with managing appointments. She has had  to reschedule important medical appointments due to not having anyone to assist. Patient has anxiety about meeting new people and being at unfamiliar places. Patient is currently participating in medication management and psychotherapy through [facility]. She has an upcoming appointment scheduled with a new PCP and is in agreement to [place] her at [place] to prevent her from not re-establishing care with new provider.  Pt and LCSWA spoke with Rogue Bussing [phone] with [facility]. Ms. Idamae Schuller confirmed that a referral for [place] (Mental Aid) and a Peer Support was completed for pt. Ms. Theodosia [phone] will confirm whether pt will be able to utilize both services simultaneously.   PLAN:   Patient will F/U with LCSW about intake to assess for Independent Support Services.

## 2018-03-17 NOTE — Progress Notes (Signed)
Subjective:  Joy Barber is a 57 y.o. female patient who returns to office for follow up evaluation of right foot/ ankle pain. Reports pain and bumps at plantar right foot at area of fibroma excision that was performed by Dr. Amalia Hailey. Patient reports that she has been staying off her foot and has a better place to stay at [facility] place. Reports for the pain using CBD oil and has use street meds. Patient reports that she is awaiting her hearing and brought in paperwork for me to review. Patient denies any other symptoms at this time.   Patient Active Problem List   Diagnosis Date Noted  . ADHD (attention deficit hyperactivity disorder) 06/11/2013  . PTSD (post-traumatic stress disorder) 06/11/2013  . History of seizures 06/11/2013  . TBI (traumatic brain injury) ([place]) 06/11/2013    Current Outpatient Medications on File Prior to Visit  Medication Sig Dispense Refill  . acetaminophen-codeine (TYLENOL #3) 300-30 MG tablet Take 1 tablet by mouth every 6 (six) hours as needed for moderate pain. Alternate with Tramadol as needed. DO NOT FILL BEFORE 3/28 30 tablet 0  . Ascorbic Acid (VITAMIN C PO) Take 1 tablet by mouth daily.    Marland Kitchen azithromycin (ZITHROMAX) 250 MG tablet Take 2 tabs PO x 1 dose, then 1 tab PO QD x 4 days 6 tablet 0  . busPIRone (BUSPAR) 7.5 MG tablet Take 4 tablets (30 mg total) by mouth 2 (two) times daily. 240 tablet 3  . diphenhydramine-acetaminophen (TYLENOL PM) 25-500 MG TABS tablet Take 1 tablet by mouth every morning.    . gabapentin (NEURONTIN) 300 MG capsule TAKE 1 CAPSULE BY MOUTH 3 TIMES DAILY. 90 capsule 1  . gentamicin cream (GARAMYCIN) 0.1 % Apply 1 application topically 3 (three) times daily. (Patient not taking: Reported on 11/14/2017) 30 g 1  . hydrochlorothiazide (HYDRODIURIL) 25 MG tablet Take 1 tablet (25 mg total) by mouth daily. (Patient taking differently: Take 25 mg by mouth daily as needed (for edema). ) 90 tablet 3  . hydrOXYzine (VISTARIL) 50 MG capsule TAKE  2 CAPSULES BY MOUTH 3 (THREE) TIMES DAILY AS NEEDED FOR ANXIETY. 90 capsule 1  . NIFEdipine (PROCARDIA-XL/ADALAT CC) 30 MG 24 hr tablet TAKE 1 TABLET BY MOUTH DAILY. 90 tablet 0  . NIFEdipine (PROCARDIA-XL/ADALAT-CC/NIFEDICAL-XL) 30 MG 24 hr tablet TAKE 1 TABLET BY MOUTH ONCE DAILY 90 tablet 0  . NON FORMULARY Piracet 800 mg tablets (memory aid): Take 1 tablet by mouth once a day or as otherwise instructed    . Omega-3 Fatty Acids (FISH OIL PO) Take 1 capsule by mouth daily.    . salicylic acid 17 % gel Apply topically daily. 15 g 0  . sodium chloride (OCEAN) 0.65 % SOLN nasal spray Place 1-2 sprays into both nostrils 3 (three) times daily as needed for congestion.     Current Facility-Administered Medications on File Prior to Visit  Medication Dose Route Frequency Provider Last Rate Last Dose  . betamethasone acetate-betamethasone sodium phosphate (CELESTONE) injection 3 mg  3 mg Intramuscular Once Edrick Kins, DPM        Allergies  Allergen Reactions  . Cephalosporins Itching  . Nsaids Other (See Comments)    History of subdural hematoma  . Penicillins Itching and Other (See Comments)    Welts (also) Has patient had a PCN reaction causing immediate rash, facial/tongue/throat swelling, SOB or lightheadedness with hypotension: Yes Has patient had a PCN reaction causing severe rash involving mucus membranes or skin necrosis: No Has  patient had a PCN reaction that required hospitalization: Yes Has patient had a PCN reaction occurring within the last 10 years: No If all of the above answers are "NO", then may proceed with Cephalosporin use.     Objective:  General: Alert and oriented x3 in no acute distress  Dermatology: + thick scar/keloid formation at plantar right foot that patient refuses to allow me to touch  Vascular: Dorsalis Pedis and Posterior Tibial pedal pulses palpable, Capillary Fill Time 3 seconds,(+) pedal hair growth bilateral, focal edema right foot and ankle,  Temperature gradient within normal limits.  Neurology: Gross sensation intact via light touch bilateral. (- )Tinels sign bilateral. Subjective burning pain on right.     Musculoskeletal: Mild tenderness diffusely across right foot and ankle with most pain per patient at previous surgical site/fibroma excision site that has a keloid to area as above, No pain with calf compression bilateral. Subjective right calf weakness, Range of motion within normal limits with mild guarding on right foot.  Gait: Antalgic gait rolling walker assisted.   Assessment and Plan: Problem List Items Addressed This Visit    None    Visit Diagnoses    Painful scar    -  Primary   Relevant Medications   traMADol (ULTRAM) 50 MG tablet   diclofenac (FLECTOR) 1.3 % PTCH   Plantar fascial fibromatosis       Post-operative state          -Complete examination performed -Discussed treatement options for control of pain at scar, weakness, and behavioral health concerns/TBI -Advised patient that for any other pain needs; should see pain management; Patient states that she will consider if she doesn't get relief with Tramdol that I rx'd -Rx Flector patch to use at painful scar  -Awaiting PT for gait training and strengthening -Placed patient in short CAM boot to protect her foot and ankle on right to use until symptoms improve -Continue with rest, ice, elevation and rolling walker and supportive sleeve/brace for edema control -Encouraged patient to go to Behavioral health for further eval/treament with peer support. -Previous letter printed for patient to have as documentation on current care -Patient to return to office PRN or sooner if condition worsens.  Landis Martins, DPM

## 2018-03-17 NOTE — Telephone Encounter (Signed)
Pt. Said that the Pharmacy faxed over paperwork to have completed for her to receive medication. Also, she wanted to know if she could just use the creme until she gets the medication.

## 2018-03-17 NOTE — Telephone Encounter (Signed)
PA for Flector Patch [id number]

## 2018-03-20 ENCOUNTER — Telehealth: Payer: Self-pay | Admitting: Licensed Clinical Social Worker

## 2018-03-20 MED FILL — NIFEDIPINE ER 30 MG TABLET: 30 | 30 days supply | Qty: 30 | Fill #2

## 2018-03-20 MED FILL — busPIRone HCL 15 MG TABS: 15 | 30 days supply | Qty: 120 | Fill #2

## 2018-03-20 NOTE — Telephone Encounter (Signed)
LCSWA received an incoming call from patient. Pt stated that she would like LCSWA to be involved in meeting between [employer] and her [place] scheduled on the same day.   The [place] is a program that provides support to clients with mental health conditions who are having difficulty managing skills to maintain independence. It can provide up to 60 hours a month.   It was determined by the Intake Specialist that pt is eligible for services. Paperwork will be sent to pt's PCP to verify pt's needs for additional support.

## 2018-03-23 ENCOUNTER — Ambulatory Visit: Payer: Medicaid Other | Attending: Family Medicine | Admitting: Licensed Clinical Social Worker

## 2018-03-23 DIAGNOSIS — F419 Anxiety disorder, unspecified: Secondary | ICD-10-CM

## 2018-03-23 MED FILL — GABAPENTIN 300 MG CAPSULE: 300 | 30 days supply | Qty: 90 | Fill #0

## 2018-03-23 MED FILL — HYDROXYZINE PAM 50 MG CAP: 50 | 15 days supply | Qty: 90 | Fill #0

## 2018-03-24 NOTE — BH Specialist Note (Signed)
Type of Service: Integrated Behavioral Health  Estimate Time:30 minutes Interpreter:No.   Joy Barber is a 57 yo female referred by self for symptoms of  irritability and frustration with the length of time it is taking to obtain a behavioral health aid as well as  psychosocial stressors. She reports a recent conflict with a family member, in addition, to stress about current roommate. Patient reports : irritability, worry, and difficulty managing emotions. Duration of problem: Ongoing    Impact: Moderate Mental health / substance use: Pt reports using marijuana daily to cope with stressors and assist pt with emotional regulation. She participates in medication management and therapy (weekly) through [facility] Appearance:Fairly Groomed ; Thought process: Coherent; Affect: Appropriate : No plan to harm self or others  LIFE /SOCIAL :  patient lives with a friend. States there is only cold water; therefore, she takes showers once every five days. There has been an increase in people at residence resulting in stress  Recent Life changes:   GOALS ADDRESSED:  Patient will: 1. Reduce symptoms of: anxiety and stress 2. Increase knowledge and/or ability of: self-management skills  3. Demonstrate ability to: Increase motivation to adhere to plan of care  INTERVENTION:  Solution Focused and Supportive Counseling, Reflective listening, Brief Counseling/Psychotherapy   Not Needed,indication of : moderate depression.  Not Needed,indication of : moderate anxiety.   ISSUES DISCUSSED: Integrated care services, support system, previous and current coping skills, current partnerships with community agencies    ASSESSMENT: Pt reports difficulty regulating emotions about current housing situation, family, and managing multiple appointments with medical and mental health providers. She has limited mobility due to utilizing a walker and wearing a boot. A completed referral for Independent Support Services  and Peer Support was completed by [facility]. LCSWA agreed to accompany pt to an intake conducted by [facility] scheduled for Monday, July 8, 19. Therapeutic interventions were discussed to promote management of emotions when triggered, in addition, to establishing a plan on how to cope with stress when unavoidable.   PLAN:  LCSWA will accompany pt to intake through [facility] scheduled Monday, July 8, 19.

## 2018-03-31 MED FILL — DICLOFENAC EPOLAMINE 1.3 %: 1.3 | 15 days supply | Qty: 30 | Fill #0

## 2018-04-03 ENCOUNTER — Other Ambulatory Visit: Payer: Self-pay

## 2018-04-03 ENCOUNTER — Other Ambulatory Visit: Payer: Self-pay | Admitting: Sports Medicine

## 2018-04-03 ENCOUNTER — Encounter: Payer: Self-pay | Admitting: Podiatry

## 2018-04-03 MED ORDER — TRAMADOL HCL 50 MG PO TABS: 50.0000 mg | ORAL_TABLET | Freq: Three times a day (TID) | ORAL | 0 refills | Status: DC | PRN

## 2018-04-03 MED FILL — traMADol HCL 50 MG TABS: 50 | 7 days supply | Qty: 21 | Fill #0

## 2018-04-03 NOTE — Progress Notes (Signed)
Received page from patient regarding swelling and "whelps" to the foot. Attempted to return phone call. No answer.

## 2018-04-06 ENCOUNTER — Telehealth: Payer: Self-pay | Admitting: Sports Medicine

## 2018-04-06 NOTE — Telephone Encounter (Signed)
Pt just received medication 03/31/2018 that was prescribed by Dr. Cannon Kettle. But now she is complaining of red bumps on the bottom of her foot and she needs to know if she needs to start the medication with this going on.Please give her a call.

## 2018-04-06 NOTE — Telephone Encounter (Signed)
Pt recently receive medication until 03/31/2018 but she currently has red bumps. Please give her a call because she needs to know if she can go ahead and start the medication

## 2018-04-07 ENCOUNTER — Ambulatory Visit: Payer: No Typology Code available for payment source | Admitting: Sports Medicine

## 2018-04-07 ENCOUNTER — Encounter: Payer: Self-pay | Admitting: Sports Medicine

## 2018-04-07 DIAGNOSIS — L905 Scar conditions and fibrosis of skin: Secondary | ICD-10-CM

## 2018-04-07 DIAGNOSIS — Z9889 Other specified postprocedural states: Secondary | ICD-10-CM

## 2018-04-07 DIAGNOSIS — M722 Plantar fascial fibromatosis: Secondary | ICD-10-CM

## 2018-04-07 DIAGNOSIS — R52 Pain, unspecified: Secondary | ICD-10-CM

## 2018-04-07 MED FILL — HYDROXYZINE PAM 50 MG CAP: 50 | 15 days supply | Qty: 90 | Fill #1

## 2018-04-07 NOTE — Progress Notes (Signed)
Subjective:  Joy Barber is a 57 y.o. female patient who returns to office for follow up evaluation of right foot pain. Reports pain and skin changes/"white spots"at plantar right foot at area of fibroma excision that was performed by Dr. Amalia Hailey. Patient reports that she is concerned because she has swelling when she is not in boot and was concerned about allergic reaction to patch and did not want to start it. Patient denies any other symptoms at this time.   Patient Active Problem List   Diagnosis Date Noted  . ADHD (attention deficit hyperactivity disorder) 06/11/2013  . PTSD (post-traumatic stress disorder) 06/11/2013  . History of seizures 06/11/2013  . TBI (traumatic brain injury) ([place]) 06/11/2013    Current Outpatient Medications on File Prior to Visit  Medication Sig Dispense Refill  . acetaminophen-codeine (TYLENOL #3) 300-30 MG tablet Take 1 tablet by mouth every 6 (six) hours as needed for moderate pain. Alternate with Tramadol as needed. DO NOT FILL BEFORE 3/28 30 tablet 0  . Ascorbic Acid (VITAMIN C PO) Take 1 tablet by mouth daily.    Marland Kitchen azithromycin (ZITHROMAX) 250 MG tablet Take 2 tabs PO x 1 dose, then 1 tab PO QD x 4 days 6 tablet 0  . busPIRone (BUSPAR) 7.5 MG tablet Take 4 tablets (30 mg total) by mouth 2 (two) times daily. 240 tablet 3  . diclofenac (FLECTOR) 1.3 % PTCH Place 1 patch onto the skin 2 (two) times daily. 30 patch 0  . diphenhydramine-acetaminophen (TYLENOL PM) 25-500 MG TABS tablet Take 1 tablet by mouth every morning.    . gabapentin (NEURONTIN) 300 MG capsule TAKE 1 CAPSULE BY MOUTH 3 TIMES DAILY. 90 capsule 1  . gentamicin cream (GARAMYCIN) 0.1 % Apply 1 application topically 3 (three) times daily. (Patient not taking: Reported on 11/14/2017) 30 g 1  . hydrochlorothiazide (HYDRODIURIL) 25 MG tablet Take 1 tablet (25 mg total) by mouth daily. (Patient taking differently: Take 25 mg by mouth daily as needed (for edema). ) 90 tablet 3  . hydrOXYzine  (VISTARIL) 50 MG capsule TAKE 2 CAPSULES BY MOUTH 3 (THREE) TIMES DAILY AS NEEDED FOR ANXIETY. 90 capsule 1  . NIFEdipine (PROCARDIA-XL/ADALAT CC) 30 MG 24 hr tablet TAKE 1 TABLET BY MOUTH DAILY. 90 tablet 0  . NIFEdipine (PROCARDIA-XL/ADALAT-CC/NIFEDICAL-XL) 30 MG 24 hr tablet TAKE 1 TABLET BY MOUTH ONCE DAILY 90 tablet 0  . NON FORMULARY Piracet 800 mg tablets (memory aid): Take 1 tablet by mouth once a day or as otherwise instructed    . Omega-3 Fatty Acids (FISH OIL PO) Take 1 capsule by mouth daily.    . salicylic acid 17 % gel Apply topically daily. 15 g 0  . sodium chloride (OCEAN) 0.65 % SOLN nasal spray Place 1-2 sprays into both nostrils 3 (three) times daily as needed for congestion.    . traMADol (ULTRAM) 50 MG tablet Take 1 tablet (50 mg total) by mouth every 8 (eight) hours as needed. 30 tablet 0   Current Facility-Administered Medications on File Prior to Visit  Medication Dose Route Frequency Provider Last Rate Last Dose  . betamethasone acetate-betamethasone sodium phosphate (CELESTONE) injection 3 mg  3 mg Intramuscular Once Edrick Kins, DPM        Allergies  Allergen Reactions  . Cephalosporins Itching  . Nsaids Other (See Comments)    History of subdural hematoma  . Penicillins Itching and Other (See Comments)    Welts (also) Has patient had a PCN reaction causing  immediate rash, facial/tongue/throat swelling, SOB or lightheadedness with hypotension: Yes Has patient had a PCN reaction causing severe rash involving mucus membranes or skin necrosis: No Has patient had a PCN reaction that required hospitalization: Yes Has patient had a PCN reaction occurring within the last 10 years: No If all of the above answers are "NO", then may proceed with Cephalosporin use.     Objective:  General: Alert and oriented x3 in no acute distress  Dermatology: + thick scar/keloid formation at plantar right foot that is hyper sensitive to touch  Vascular: Dorsalis Pedis and  Posterior Tibial pedal pulses palpable, Capillary Fill Time 3 seconds,(+) pedal hair growth bilateral, focal edema right foot and ankle, Temperature gradient within normal limits.  Neurology: Gross sensation intact via light touch bilateral. (- )Tinels sign bilateral. Subjective burning pain on right.     Musculoskeletal: Mild tenderness diffusely across right foot and ankle with most pain per patient at previous surgical site/fibroma excision site that has a keloid to area as above, No pain with calf compression bilateral. Subjective right calf weakness, Range of motion within normal limits with mild guarding on right foot.  Gait: Antalgic gait rolling walker assisted.   Assessment and Plan: Problem List Items Addressed This Visit    None    Visit Diagnoses    Painful scar    -  Primary   Plantar fascial fibromatosis       Post-operative state          -Complete examination performed -Discussed treatement options for control of pain at scar with no signs of infection and no "white bumps"  -To start Flector patch to use at painful scar and to secure in place with coban  -Awaiting PT for gait training and strengthening; Patient to start on Thursday -Continue with short CAM boot  -Continue with rest, ice, elevation and rolling walker  -Recommend Behavioral health for further eval/treament with peer support. -Patient to return to office PRN or sooner if condition worsens.  Landis Martins, DPM

## 2018-04-13 ENCOUNTER — Telehealth: Payer: Self-pay | Admitting: Podiatry

## 2018-04-13 NOTE — Telephone Encounter (Signed)
Pt called and stated that she will be meeting with the young lady from [facility] specialist and she will schedule physical therapy. Please let  Dr. Cannon Kettle know.

## 2018-04-16 ENCOUNTER — Telehealth: Payer: Self-pay | Admitting: *Deleted

## 2018-04-16 ENCOUNTER — Ambulatory Visit: Payer: Medicaid Other | Attending: Family Medicine | Admitting: Licensed Clinical Social Worker

## 2018-04-16 DIAGNOSIS — F419 Anxiety disorder, unspecified: Secondary | ICD-10-CM

## 2018-04-16 MED ORDER — TRAMADOL HCL 50 MG PO TABS: 50.0000 mg | ORAL_TABLET | Freq: Four times a day (QID) | ORAL | 0 refills | Status: DC | PRN

## 2018-04-16 MED FILL — traMADol HCL 50 MG TABS: 50 | 7 days supply | Qty: 28 | Fill #0

## 2018-04-16 NOTE — Telephone Encounter (Signed)
Pt states she needs a refill of the tramadol, and she has been given 960 authorized units of PT. I copied pt's [place] authorization for the PT to be scanned in her chart. Dr. Cannon Kettle ordered refill the Tramadol 50mg  #28 one tablet every 6 hours prn foot pain. Tramadol rx is printed and given to pt with her original of the [facility] authorization form.

## 2018-04-17 NOTE — BH Specialist Note (Signed)
LCSWA met with pt in clinic. Pt shared that she has been experiencing financial hardship and has had to panhandle to afford medications. She reports anxiety about whether her independent services have been approved.   Pt had a meeting scheduled on 04/16/18 to meet with therapist, Rush Landmark, at [place] and [place] from [place]. LCSWA encouraged pt to utilize mindfulness strategies to decrease anxiety. Pt practiced deep breathing and verbally agreed to keep LCSWA updated on scheduled meetings.    LCSWA received call from patient. She stated that she has been approved for services and Elmyra Ricks has agreed to attend upcoming medical appointments with her. No additional concerns were noted.

## 2018-04-20 ENCOUNTER — Ambulatory Visit: Payer: No Typology Code available for payment source | Admitting: Family Medicine

## 2018-04-20 ENCOUNTER — Other Ambulatory Visit: Payer: Self-pay | Admitting: Family Medicine

## 2018-04-20 MED FILL — HYDROXYZINE PAM 50 MG CAP: 50 | 15 days supply | Qty: 90 | Fill #2

## 2018-04-20 MED FILL — busPIRone HCL 15 MG TABS: 15 | 30 days supply | Qty: 120 | Fill #0

## 2018-04-20 MED FILL — GABAPENTIN 300 MG CAPSULE: 300 | 30 days supply | Qty: 90 | Fill #1

## 2018-04-21 ENCOUNTER — Other Ambulatory Visit: Payer: Self-pay

## 2018-04-21 ENCOUNTER — Ambulatory Visit: Payer: No Typology Code available for payment source | Admitting: Sports Medicine

## 2018-04-22 NOTE — Telephone Encounter (Signed)
Joy Barber,   Could you please call and see if patient has enough medication until her office visit with me next week? If so, we will refill at that visit. She had cancelled previously, and I have never seen her in office. Can you see that she had been seen by [place] at this office? You can also inquire as to why she cancelled last office visit?  Thanks.

## 2018-04-24 ENCOUNTER — Telehealth (HOSPITAL_COMMUNITY): Payer: Self-pay

## 2018-04-24 NOTE — Telephone Encounter (Signed)
Patient states that she does have enough medication until appointment to 05/01/2018.

## 2018-04-24 NOTE — Telephone Encounter (Signed)
Called left a message to return call to [facility]

## 2018-04-27 ENCOUNTER — Ambulatory Visit: Payer: No Typology Code available for payment source | Admitting: Licensed Clinical Social Worker

## 2018-04-27 ENCOUNTER — Encounter: Payer: No Typology Code available for payment source | Admitting: Psychology

## 2018-04-28 ENCOUNTER — Ambulatory Visit: Payer: No Typology Code available for payment source | Admitting: Licensed Clinical Social Worker

## 2018-04-29 ENCOUNTER — Ambulatory Visit (INDEPENDENT_AMBULATORY_CARE_PROVIDER_SITE_OTHER): Payer: Medicaid Other

## 2018-04-29 ENCOUNTER — Encounter (INDEPENDENT_AMBULATORY_CARE_PROVIDER_SITE_OTHER): Payer: Self-pay | Admitting: Family

## 2018-04-29 ENCOUNTER — Other Ambulatory Visit: Payer: Self-pay

## 2018-04-29 ENCOUNTER — Ambulatory Visit (INDEPENDENT_AMBULATORY_CARE_PROVIDER_SITE_OTHER): Payer: Medicaid Other | Admitting: Family

## 2018-04-29 VITALS — Ht 64.0 in | Wt 159.0 lb

## 2018-04-29 DIAGNOSIS — M6702 Short Achilles tendon (acquired), left ankle: Secondary | ICD-10-CM | POA: Diagnosis not present

## 2018-04-29 DIAGNOSIS — M25562 Pain in left knee: Secondary | ICD-10-CM | POA: Diagnosis not present

## 2018-04-29 DIAGNOSIS — G8929 Other chronic pain: Secondary | ICD-10-CM | POA: Diagnosis not present

## 2018-04-29 DIAGNOSIS — M25572 Pain in left ankle and joints of left foot: Secondary | ICD-10-CM | POA: Diagnosis not present

## 2018-04-29 MED ORDER — TRAMADOL HCL 50 MG PO TABS: 50.0000 mg | ORAL_TABLET | Freq: Four times a day (QID) | ORAL | 0 refills | Status: DC | PRN
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
## 2018-04-30 ENCOUNTER — Telehealth (INDEPENDENT_AMBULATORY_CARE_PROVIDER_SITE_OTHER): Payer: Self-pay | Admitting: Family

## 2018-04-30 NOTE — Telephone Encounter (Signed)
I called and pt states that she received a large brace but the box said medium. She will bring it in to exchange it.,

## 2018-04-30 NOTE — Telephone Encounter (Signed)
Patient called advised the knee brace she got yesterday was to big and is falling down. Patient said she need to bring the brace back and get a smaller one. The number to contact patient is [phone]

## 2018-05-01 ENCOUNTER — Ambulatory Visit: Payer: No Typology Code available for payment source | Admitting: Family Medicine

## 2018-05-01 MED FILL — traMADol HCL 50 MG TABS: 50 | 7 days supply | Qty: 28 | Fill #0

## 2018-05-04 ENCOUNTER — Ambulatory Visit: Payer: Medicaid Other

## 2018-05-05 ENCOUNTER — Other Ambulatory Visit: Payer: Self-pay

## 2018-05-05 ENCOUNTER — Ambulatory Visit: Payer: Medicaid Other | Admitting: Physical Therapy

## 2018-05-05 ENCOUNTER — Ambulatory Visit: Payer: No Typology Code available for payment source | Admitting: Sports Medicine

## 2018-05-05 MED ORDER — TRAMADOL HCL 50 MG PO TABS: 50.0000 mg | ORAL_TABLET | Freq: Four times a day (QID) | ORAL | 0 refills | Status: DC | PRN

## 2018-05-06 ENCOUNTER — Telehealth: Payer: Self-pay | Admitting: Sports Medicine

## 2018-05-06 ENCOUNTER — Ambulatory Visit: Payer: No Typology Code available for payment source | Admitting: Licensed Clinical Social Worker

## 2018-05-06 NOTE — Telephone Encounter (Signed)
Patient was extremely late by almost 2 hours for office visit on yesterday and due to scheduling could not be seen however she did leave a note with my receptionist stating that her Unna boot was tight and so she cut it off on Monday and that she will be starting physical therapy on 05/11/2018.  This note was reviewed patient to continue with current plan of physical therapy to assist with pain and scar formation at the plantar aspect of the right foot.  No further follow-up is needed at this time in office unless acute problems or new issues arise. -Dr. Cannon Kettle

## 2018-05-08 ENCOUNTER — Ambulatory Visit: Payer: Medicaid Other | Attending: Family Medicine | Admitting: Licensed Clinical Social Worker

## 2018-05-08 DIAGNOSIS — F419 Anxiety disorder, unspecified: Secondary | ICD-10-CM

## 2018-05-08 NOTE — BH Specialist Note (Signed)
LCSWA met with pt in clinic. Pt shared that she has received numerous calls from Stephanie Blair with the Secretary's Office located in [place], [place] regarding paperwork for disability. Pt stated that she is feeling, "overwhelmed because she is getting incongruent information" from different sources.   Pt made an attempt to contact Ms. Blair in LCSWA's presence and left a detailed message stating that she does not want the Secretary's Office to do anything on her behalf. Pt wishes to contact her local Social Security Office herself to address questions/concerns about her claim.   LCSWA encouraged pt to complete a list of questions/concerns prior to visiting the Social Security Office to ensure she has an effective visit.     , LCSWA 05/11/18 9:40 AM  

## 2018-05-11 ENCOUNTER — Ambulatory Visit: Payer: Medicare Other | Attending: Sports Medicine | Admitting: Physical Therapy

## 2018-05-11 ENCOUNTER — Telehealth: Payer: Self-pay | Admitting: *Deleted

## 2018-05-11 ENCOUNTER — Other Ambulatory Visit: Payer: Self-pay

## 2018-05-11 DIAGNOSIS — M6281 Muscle weakness (generalized): Secondary | ICD-10-CM | POA: Insufficient documentation

## 2018-05-11 DIAGNOSIS — Z9181 History of falling: Secondary | ICD-10-CM | POA: Diagnosis not present

## 2018-05-11 DIAGNOSIS — M79671 Pain in right foot: Secondary | ICD-10-CM

## 2018-05-11 DIAGNOSIS — M779 Enthesopathy, unspecified: Secondary | ICD-10-CM

## 2018-05-11 DIAGNOSIS — R262 Difficulty in walking, not elsewhere classified: Secondary | ICD-10-CM | POA: Diagnosis not present

## 2018-05-12 ENCOUNTER — Ambulatory Visit (INDEPENDENT_AMBULATORY_CARE_PROVIDER_SITE_OTHER): Payer: Medicaid Other | Admitting: Sports Medicine

## 2018-05-12 ENCOUNTER — Encounter: Payer: Self-pay | Admitting: Physical Therapy

## 2018-05-12 ENCOUNTER — Encounter: Payer: Self-pay | Admitting: Sports Medicine

## 2018-05-12 DIAGNOSIS — M722 Plantar fascial fibromatosis: Secondary | ICD-10-CM | POA: Diagnosis not present

## 2018-05-12 DIAGNOSIS — M7750 Other enthesopathy of unspecified foot: Secondary | ICD-10-CM | POA: Diagnosis not present

## 2018-05-12 DIAGNOSIS — L905 Scar conditions and fibrosis of skin: Secondary | ICD-10-CM

## 2018-05-12 DIAGNOSIS — M779 Enthesopathy, unspecified: Secondary | ICD-10-CM

## 2018-05-12 DIAGNOSIS — R52 Pain, unspecified: Principal | ICD-10-CM

## 2018-05-12 DIAGNOSIS — Z9889 Other specified postprocedural states: Secondary | ICD-10-CM

## 2018-05-12 NOTE — Therapy (Signed)
[facility] [address] [place], [place], [address] Phone: [phone]   Fax:  [phone]  Physical Therapy Evaluation  Patient Details  Name: Joy Barber MRN: [id number] Date of Birth: [birth year] Referring Provider: Asencion Islam, DPM   Encounter Date: 05/11/2018  PT End of Session - 05/12/18 0926    Visit Number  1    Number of Visits  4    Authorization Type  MCD    PT Start Time  1630    PT Stop Time  1715    PT Time Calculation (min)  45 min    Activity Tolerance  Patient limited by pain;Treatment limited secondary to agitation    Behavior During Therapy  Restless;Agitated;Anxious       Past Medical History:  Diagnosis Date  . ADHD (attention deficit hyperactivity disorder)   . Allergy   . Anxiety   . Complication of anesthesia    "to ether anesthesia when I was 57 years old, woke up with N/V"  . GERD (gastroesophageal reflux disease)   . Head injury    post-concusive syndrome  . Headache   . Hypertension   . OCD (obsessive compulsive disorder)   . PTSD (post-traumatic stress disorder)   . Seizures (HCC)    s/p TBI  . Subdural hematoma (HCC) sept 2012    Past Surgical History:  Procedure Laterality Date  . ABDOMINAL HYSTERECTOMY    . COSMETIC SURGERY  1975   forehead  . PLANTAR FASCIA RELEASE Right 05/22/2017   Procedure: excision PLANTAR fibromas;  Surgeon: Felecia Shelling, DPM;  Location: [facility] OR;  Service: Podiatry;  Laterality: Right;    There were no vitals filed for this visit.   Subjective Assessment - 05/11/18 1642    Subjective  Aug 31 of 2018 surgery to foot, hematoma formed about 2 months post op. It was not bothering me but my pain went up after removal. Was in surgical shoe, now in boot due to increased pain and swelling.     How long can you walk comfortably?  unable    Patient Stated Goals  be able to walk without AD, decrease pain, improve balance- shower, bike    Currently in Pain?   Yes    Pain Score  6    took pain meds   Pain Location  Foot    Pain Orientation  Right    Pain Descriptors / Indicators  Burning   stinging   Aggravating Factors   weight bearing    Pain Relieving Factors  rest, ice, una boot         [facility] PT Assessment - 05/12/18 0001      Assessment   Medical Diagnosis  tendonitis    Referring Provider  Asencion Islam, DPM    Onset Date/Surgical Date  05/22/18    Hand Dominance  Right      Precautions   Precaution Comments  PTSD, seizures      Restrictions   Other Position/Activity Restrictions  WBAT      Balance Screen   Has the patient fallen in the past 6 months  Yes    How many times?  --   "multiple"     Home Environment   Additional Comments  uses SPC at home- not appropriate for rolling walker      Prior Function   Level of Independence  Independent with basic ADLs      Cognition   Overall Cognitive Status  History of  cognitive impairments - at baseline      Sensation   Additional Comments  numbness in first 2 toes      ROM / Strength   AROM / PROM / Strength  AROM;Strength      AROM   Overall AROM Comments  ankle AROM demo shy of neutral DF, PF/inv/eversion apparently [facility]      Strength   Overall Strength Comments  demo grossly 3/5- moving against gravity, unable to perform MMT at eval      Palpation   Palpation comment  unable to palpate at eval- pt unable to tolerate resting foot on ground      Ambulation/Gait   Assistive device  Rollator    Gait Comments  Rt knee resting on bench of rollator, uses SPC at home- not safe at eval                Objective measurements completed on examination: See above findings.      [facility] Adult PT Treatment/Exercise - 05/12/18 0001      Exercises   Exercises  Other Exercises    Other Exercises   provided printout of ankle AROM exercises, did not have time to perform in clinic             PT Education - 05/12/18 1129    Education Details  anatomy of  condition, POC, HEP, AD, stress, braces, MCD authorizations    Person(s) Educated  Patient;Other (comment)   social worker   Methods  Explanation;Handout    Comprehension  Verbalized understanding;Need further instruction       PT Short Term Goals - 05/12/18 1122      PT SHORT TERM GOAL #1   Title  pt will be able to demo HEP without requiring cues for form    Baseline  began establishing short term HEP at eval    Time  3    Period  Weeks    Status  New    Target Date  05/29/18      PT SHORT TERM GOAL #2   Title  pt will be able to lay the plantar surface of her foot on the floor without weight bearing    Baseline  unable to tolerate tactile sensation at eval    Time  3    Period  Weeks    Status  New    Target Date  05/29/18        PT Long Term Goals - 05/12/18 1123      PT LONG TERM GOAL #1   Title  Pt will demo DF to at least 5 deg for required ROM during ambulation    Baseline  demo AROM shy of neutral at eval    Time  10   time to accommodate for MCD authorization periods   Period  Weeks    Status  New    Target Date  07/24/18      PT LONG TERM GOAL #2   Title  Pt will be able to balance enough to feel safe taking a shower on an acceptable basis for her    Baseline  reports 10 day gaps in showering due to poor balance and risk of falling    Time  10    Period  Weeks    Status  New    Target Date  07/24/18      PT LONG TERM GOAL #3   Title  pt will be able to utilize both feet  on the ground for ambulation with SPC to move around her home    Baseline  home not appropriate for rollator, unable to place weight on Rt foot past most post aspect of calcaneus with severe pain, leaning onto [facility]    Time  10    Period  Weeks    Status  New    Target Date  07/24/18      PT LONG TERM GOAL #4   Title  pt will report pain levels <=4/10 with functional daily ambulation    Baseline  6/10 at rest following pain meds at eval    Time  10    Period  Weeks    Status  New     Target Date  07/24/18             Plan - 05/11/18 1710    Clinical Impression Statement  Pt accompanied by Jenel Lucks, [place] clinical social worker. Pt presents to PT with complaints of Rt foot pain following "popping of a hematoma" when at a follow up from surgery on her foot (05/22/2017). Pt is wearing a boot and ambulating with rollator. Reports she cannot put weight on her foot other than striking on the post aspect of her heel and plantar fascia feels taught. Pt reports high levels of stress and anxiety due to difficulty with multiple medical issues and a stressful living situation. Pt ambulates with SPC at home because she cannot manuever rollator. She is wearing a hinge brace on Lt knee and lace-up AFO on Lt foot as a preventative measure. Pt reports inability to shower for multiple days at a time due to poor balance and fear of falling. Pt was unable to lay foot on floor without WB today. Discussed that in order to walk we need for first decrease sensitivity, begin strengthening and then progress to walking. Asked her to try wearing a regular shoe on her Rt foot without putting it on the ground for desensitization- pt reports her shoes are in the basement and she cannot get them. Pt verbalized understanding of this plan. Was unable to take objective measures at eval due to significant time taken to discuss history and decreasing pt anxiety- observed measures documented. Pt will benefit from skilled PT to improve functional ability and safety in functional movement to reach long term goals.     History and Personal Factors relevant to plan of care:  ADHD, anxiety, PTSD, OCD, TBI, seizures    Clinical Presentation  Unstable    Clinical Presentation due to:  pt very anxious ad difficult to stay on topic, treatment affected by history and personal factors, severe pain levels, pain reported in multiple ankles due to changes in gait    Clinical Decision Making  High    Rehab Potential   Fair    PT Frequency  --   3 visits in first MCD auth, followed by 2/week 6 weeks   PT Treatment/Interventions  ADLs/Self Care Home Management;Cryotherapy;Moist Heat;Therapeutic exercise;Therapeutic activities;Functional mobility training;Stair training;Gait training;Ultrasound;DME Instruction;Balance training;Neuromuscular re-education;Patient/family education;Manual techniques;Taping;Passive range of motion    PT Next Visit Plan  continue desensitization techniques, [place] strengthening    PT Home Exercise Plan  ankle AROM in all planes, try to put foot on floor.     Consulted and Agree with Plan of Care  Patient;Other (Comment)   social worker- see plan      Patient will benefit from skilled therapeutic intervention in order to improve the following deficits and impairments:  Abnormal  gait, Decreased endurance, Impaired sensation, Decreased activity tolerance, Decreased strength, Pain, Difficulty walking, Decreased mobility, Decreased balance, Decreased range of motion, Improper body mechanics, Impaired flexibility, Decreased coordination  Visit Diagnosis: Tendinitis - Plan: PT plan of care cert/re-cert  Pain in right foot - Plan: PT plan of care cert/re-cert  Difficulty in walking, not elsewhere classified - Plan: PT plan of care cert/re-cert  Muscle weakness (generalized) - Plan: PT plan of care cert/re-cert  History of falling - Plan: PT plan of care cert/re-cert     Problem List Patient Active Problem List   Diagnosis Date Noted  . ADHD (attention deficit hyperactivity disorder) 06/11/2013  . PTSD (post-traumatic stress disorder) 06/11/2013  . History of seizures 06/11/2013  . TBI (traumatic brain injury) (HCC) 06/11/2013    Tocara Mennen C. Kin Galbraith PT, DPT 05/12/18 11:37 AM   [facility] [address] [place], [place], [address] Phone: [phone]   Fax:  [phone]  Name: Joy Barber MRN: [id number] Date of Birth:  05-24-1961

## 2018-05-12 NOTE — Progress Notes (Signed)
Subjective:  Joy Barber is a 57 y.o. female patient who returns to office for follow up evaluation of right foot pain. Reports that she had some increase soreness with PT and can not put full weight on foot because bottom is sensitive, using patch, brace, CAM boot and started PT on yesterday. Patient reports that she is worried about her disability and she has a Chief Executive Officer who is helping. Currently her case is being reviewed. Patient also reports that she has tramadol with another refill at the pharmacy that was electronically sent in. Patient is frustrated because of medications not showing properly on her record and frustrated because the assistant from psych did not show up to go with her to her PT appointment on yesterday, she had to go with another person. Patient admits that she does smoke weed daily. Patient denies any other symptoms at this time.   Patient Active Problem List   Diagnosis Date Noted  . ADHD (attention deficit hyperactivity disorder) 06/11/2013  . PTSD (post-traumatic stress disorder) 06/11/2013  . History of seizures 06/11/2013  . TBI (traumatic brain injury) ([place]) 06/11/2013    Current Outpatient Medications on File Prior to Visit  Medication Sig Dispense Refill  . acetaminophen-codeine (TYLENOL #3) 300-30 MG tablet Take 1 tablet by mouth every 6 (six) hours as needed for moderate pain. Alternate with Tramadol as needed. DO NOT FILL BEFORE 3/28 (Patient not taking: Reported on 05/12/2018) 30 tablet 0  . Ascorbic Acid (VITAMIN C PO) Take 1 tablet by mouth daily.    Marland Kitchen azithromycin (ZITHROMAX) 250 MG tablet Take 2 tabs PO x 1 dose, then 1 tab PO QD x 4 days (Patient not taking: Reported on 05/12/2018) 6 tablet 0  . busPIRone (BUSPAR) 15 MG tablet TAKE 2 TABLETS BY MOUTH TWICE DAILY 120 tablet 2  . busPIRone (BUSPAR) 7.5 MG tablet Take 4 tablets (30 mg total) by mouth 2 (two) times daily. (Patient not taking: Reported on 05/12/2018) 240 tablet 3  . diclofenac (FLECTOR) 1.3 % PTCH  Place 1 patch onto the skin 2 (two) times daily. (Patient not taking: Reported on 05/12/2018) 30 patch 0  . diphenhydramine-acetaminophen (TYLENOL PM) 25-500 MG TABS tablet Take 1 tablet by mouth every morning.    . gabapentin (NEURONTIN) 300 MG capsule TAKE 1 CAPSULE BY MOUTH 3 TIMES DAILY. 90 capsule 1  . gentamicin cream (GARAMYCIN) 0.1 % Apply 1 application topically 3 (three) times daily. (Patient not taking: Reported on 05/12/2018) 30 g 1  . hydrochlorothiazide (HYDRODIURIL) 25 MG tablet Take 1 tablet (25 mg total) by mouth daily. (Patient taking differently: Take 25 mg by mouth daily as needed (for edema). ) 90 tablet 3  . hydrOXYzine (VISTARIL) 50 MG capsule TAKE 2 CAPSULES BY MOUTH 3 (THREE) TIMES DAILY AS NEEDED FOR ANXIETY. 90 capsule 1  . NIFEdipine (PROCARDIA-XL/ADALAT CC) 30 MG 24 hr tablet TAKE 1 TABLET BY MOUTH DAILY. (Patient not taking: Reported on 05/12/2018) 90 tablet 0  . NIFEdipine (PROCARDIA-XL/ADALAT-CC/NIFEDICAL-XL) 30 MG 24 hr tablet TAKE 1 TABLET BY MOUTH ONCE DAILY 90 tablet 0  . NON FORMULARY Piracet 800 mg tablets (memory aid): Take 1 tablet by mouth once a day or as otherwise instructed    . Omega-3 Fatty Acids (FISH OIL PO) Take 1 capsule by mouth daily.    . salicylic acid 17 % gel Apply topically daily. (Patient not taking: Reported on 05/12/2018) 15 g 0  . sodium chloride (OCEAN) 0.65 % SOLN nasal spray Place 1-2 sprays into both  nostrils 3 (three) times daily as needed for congestion.    . traMADol (ULTRAM) 50 MG tablet Take 1 tablet (50 mg total) by mouth every 6 (six) hours as needed. 28 tablet 0   Current Facility-Administered Medications on File Prior to Visit  Medication Dose Route Frequency Provider Last Rate Last Dose  . betamethasone acetate-betamethasone sodium phosphate (CELESTONE) injection 3 mg  3 mg Intramuscular Once Edrick Kins, DPM        Allergies  Allergen Reactions  . Cephalosporins Itching  . Nsaids Other (See Comments)    History of  subdural hematoma  . Penicillins Itching and Other (See Comments)    Welts (also) Has patient had a PCN reaction causing immediate rash, facial/tongue/throat swelling, SOB or lightheadedness with hypotension: Yes Has patient had a PCN reaction causing severe rash involving mucus membranes or skin necrosis: No Has patient had a PCN reaction that required hospitalization: Yes Has patient had a PCN reaction occurring within the last 10 years: No If all of the above answers are "NO", then may proceed with Cephalosporin use.     Objective:  General: Alert and oriented x3 in no acute distress  Dermatology: + thick scar/keloid formation at plantar right foot that is hyper sensitive to touch as previous  Vascular: Dorsalis Pedis and Posterior Tibial pedal pulses palpable, Capillary Fill Time 3 seconds,(+) pedal hair growth bilateral, minimal focal edema right foot and ankle, Temperature gradient within normal limits.  Neurology: Gross sensation intact via light touch bilateral. (- )Tinels sign bilateral. Subjective burning pain on right.     Musculoskeletal: Mild tenderness diffusely across right foot and ankle with most pain per patient at previous surgical site/fibroma excision site that has a keloid to area as above, No pain with calf compression bilateral. Subjective right calf weakness, Range of motion within normal limits with mild guarding on right foot.  Gait: Antalgic gait rolling walker assisted.   Assessment and Plan: Problem List Items Addressed This Visit    None    Visit Diagnoses    Painful scar    -  Primary   Plantar fascial fibromatosis       Post-operative state       Tendonitis          -Complete examination performed -Discussed treatement options for control of pain at scar  -Continue Flector patch to use at painful scar and to secure in place with coban  -Continue PT for gait training and strengthening; will have PT to add Iontophroesis and TENS -Continue with  short CAM boot  -Continue with rest, ice, elevation and rolling walker  -Recommend Behavioral health for further eval/treament with peer support as previous. -Patient to return to office PRN or sooner if condition worsens.  Landis Martins, DPM

## 2018-05-14 NOTE — Telephone Encounter (Signed)
Pt states someone called in a prescription for tramadol and she does not need it she has 8 pills left. Pt states she wants to continue to get the hardcopy prescriptions.

## 2018-05-15 ENCOUNTER — Ambulatory Visit: Payer: Medicare Other | Attending: Family Medicine | Admitting: Licensed Clinical Social Worker

## 2018-05-15 ENCOUNTER — Ambulatory Visit: Payer: No Typology Code available for payment source | Admitting: Family Medicine

## 2018-05-15 ENCOUNTER — Telehealth: Payer: Self-pay

## 2018-05-15 DIAGNOSIS — F419 Anxiety disorder, unspecified: Secondary | ICD-10-CM

## 2018-05-15 MED FILL — traMADol HCL 50 MG TABS: 50 | 7 days supply | Qty: 28 | Fill #0

## 2018-05-15 NOTE — Telephone Encounter (Signed)
Met with the patient, Joy Barber, [place], [facility] SW Intern.  Reviewed social security disability, medicare and medicaid paperwork at patient's request.  Developed agenda for her meeting at the [facility] next week with Joy See, LCSW. As per the documents reviewed, she has been approved for disability with back payments set to begin 05/2018. She has also been assigned to [facility] for her medicare part D benefit with no monthly premium.   She spoke about her concerns with her current living situation and hopes to move into a place of her own after receiving her disability check.

## 2018-05-15 NOTE — BH Specialist Note (Signed)
LCSWA met with pt in clinic with MSW Intern and RN CM, Casandra Doffing. Pt disclosed questions regarding medicaid, medicare, and disability. RN CM reviewed pt's documents and provided explanation of expected benefits. Pt shared conflicted feelings over making decisions and anxiety for the future.  Pt's feelings of frustration and anxiety were validated and encouragement was provided.  Staff discussed various strategies to assist pt with making informed decisions. Pt shared that she plans on practicing yoga and meditation to assist with decreasing symptoms of anxiety, depression, and stress.     Christa See, [place] 05/15/18 5:40 PM

## 2018-05-19 ENCOUNTER — Other Ambulatory Visit: Payer: Self-pay | Admitting: Family Medicine

## 2018-05-19 MED FILL — busPIRone HCL 15 MG TABS: 15 | 30 days supply | Qty: 120 | Fill #1

## 2018-05-19 MED FILL — GABAPENTIN 300 MG CAPSULE: 300 | 30 days supply | Qty: 90 | Fill #2

## 2018-05-20 MED FILL — HYDROXYZINE PAM 50 MG CAP: 50 | 15 days supply | Qty: 90 | Fill #0

## 2018-05-21 ENCOUNTER — Ambulatory Visit: Payer: Medicare Other | Admitting: Physical Therapy

## 2018-05-21 ENCOUNTER — Encounter: Payer: Self-pay | Admitting: Physical Therapy

## 2018-05-21 DIAGNOSIS — M779 Enthesopathy, unspecified: Secondary | ICD-10-CM | POA: Diagnosis not present

## 2018-05-21 DIAGNOSIS — Z9181 History of falling: Secondary | ICD-10-CM | POA: Diagnosis not present

## 2018-05-21 DIAGNOSIS — M6281 Muscle weakness (generalized): Secondary | ICD-10-CM

## 2018-05-21 DIAGNOSIS — M79671 Pain in right foot: Secondary | ICD-10-CM

## 2018-05-21 DIAGNOSIS — R262 Difficulty in walking, not elsewhere classified: Secondary | ICD-10-CM | POA: Diagnosis not present

## 2018-05-21 NOTE — Therapy (Signed)
[place], [place], [address] Phone: [phone]   Fax:  [phone]  Physical Therapy Treatment  Patient Details  Name: Joy Barber MRN: [id number] Date of Birth: August 02, 1961 Referring Provider: Landis Martins, DPM   Encounter Date: 05/21/2018  PT End of Session - 05/21/18 1232    Visit Number  2    Number of Visits  4    Authorization Type  MCD    PT Start Time  8338    PT Stop Time  1232    PT Time Calculation (min)  47 min    Activity Tolerance  Patient tolerated treatment well    Behavior During Therapy  Anxious;Restless       Past Medical History:  Diagnosis Date  . ADHD (attention deficit hyperactivity disorder)   . Allergy   . Anxiety   . Complication of anesthesia    "to ether anesthesia when I was 57 years old, woke up with N/V"  . GERD (gastroesophageal reflux disease)   . Head injury    post-concusive syndrome  . Headache   . Hypertension   . OCD (obsessive compulsive disorder)   . PTSD (post-traumatic stress disorder)   . Seizures ([place])    s/p TBI  . Subdural hematoma ([place]) sept 2012    Past Surgical History:  Procedure Laterality Date  . ABDOMINAL HYSTERECTOMY    . COSMETIC SURGERY  1975   forehead  . PLANTAR FASCIA RELEASE Right 05/22/2017   Procedure: excision PLANTAR fibromas;  Surgeon: Edrick Kins, DPM;  Location: [facility];  Service: Podiatry;  Laterality: Right;    There were no vitals filed for this visit.  Subjective Assessment - 05/21/18 1149    Subjective  Feels stiff. I tried to do my exercises, Still cannot move my right first two toes.     Patient Stated Goals  be able to walk without AD, decrease pain, improve balance- shower, bike    Currently in Pain?  Yes    Pain Score  7    took a pain pill   Pain Location  Foot    Pain Descriptors / Indicators  --   stinging, burning        [facility] PT Assessment - 05/21/18 0001      Assessment   Medical  Diagnosis  tendinitis    Referring Provider  Landis Martins, DPM    Onset Date/Surgical Date  05/22/18                   [facility] Adult PT Treatment/Exercise - 05/21/18 0001      Exercises   Exercises  Ankle;Knee/Hip      Knee/Hip Exercises: Seated   Other Seated Knee/Hip Exercises  iso adduction ball squeeze      Knee/Hip Exercises: Sidelying   Hip ABduction  20 reps      Knee/Hip Exercises: Prone   Hip Extension Limitations  with knee flexed      Modalities   Modalities  Iontophoresis      Iontophoresis   Type of Iontophoresis  Dexamethasone    Location  plantar aspect of Rt foot   pt prefers to clean w alcohol swab herself d/t sensitivity   Dose  1cc    Time  6 hr wear      Ankle Exercises: Stretches   Gastroc Stretch  2 reps;30 seconds      Ankle Exercises: Seated   Heel Raises Limitations  ball bw knees  [facility] [place], [place], [address] Phone: [phone]   Fax:  [phone]  Physical Therapy Treatment  Patient Details  Name: Joy Barber MRN: [id number] Date of Birth: August 02, 1961 Referring Provider: Landis Martins, DPM   Encounter Date: 05/21/2018  PT End of Session - 05/21/18 1232    Visit Number  2    Number of Visits  4    Authorization Type  MCD    PT Start Time  8338    PT Stop Time  1232    PT Time Calculation (min)  47 min    Activity Tolerance  Patient tolerated treatment well    Behavior During Therapy  Anxious;Restless       Past Medical History:  Diagnosis Date  . ADHD (attention deficit hyperactivity disorder)   . Allergy   . Anxiety   . Complication of anesthesia    "to ether anesthesia when I was 57 years old, woke up with N/V"  . GERD (gastroesophageal reflux disease)   . Head injury    post-concusive syndrome  . Headache   . Hypertension   . OCD (obsessive compulsive disorder)   . PTSD (post-traumatic stress disorder)   . Seizures ([place])    s/p TBI  . Subdural hematoma ([place]) sept 2012    Past Surgical History:  Procedure Laterality Date  . ABDOMINAL HYSTERECTOMY    . COSMETIC SURGERY  1975   forehead  . PLANTAR FASCIA RELEASE Right 05/22/2017   Procedure: excision PLANTAR fibromas;  Surgeon: Edrick Kins, DPM;  Location: [facility];  Service: Podiatry;  Laterality: Right;    There were no vitals filed for this visit.  Subjective Assessment - 05/21/18 1149    Subjective  Feels stiff. I tried to do my exercises, Still cannot move my right first two toes.     Patient Stated Goals  be able to walk without AD, decrease pain, improve balance- shower, bike    Currently in Pain?  Yes    Pain Score  7    took a pain pill   Pain Location  Foot    Pain Descriptors / Indicators  --   stinging, burning        [facility] PT Assessment - 05/21/18 0001      Assessment   Medical  Diagnosis  tendinitis    Referring Provider  Landis Martins, DPM    Onset Date/Surgical Date  05/22/18                   [facility] Adult PT Treatment/Exercise - 05/21/18 0001      Exercises   Exercises  Ankle;Knee/Hip      Knee/Hip Exercises: Seated   Other Seated Knee/Hip Exercises  iso adduction ball squeeze      Knee/Hip Exercises: Sidelying   Hip ABduction  20 reps      Knee/Hip Exercises: Prone   Hip Extension Limitations  with knee flexed      Modalities   Modalities  Iontophoresis      Iontophoresis   Type of Iontophoresis  Dexamethasone    Location  plantar aspect of Rt foot   pt prefers to clean w alcohol swab herself d/t sensitivity   Dose  1cc    Time  6 hr wear      Ankle Exercises: Stretches   Gastroc Stretch  2 reps;30 seconds      Ankle Exercises: Seated   Heel Raises Limitations  ball bw knees  iontophoresis patch. Pt set a timer on her phone to remove patch in 6 hr. Did not complain of increased pain at the end of the session but rest breaks were required during ankle 4-way with tband due to reported burning.     PT Treatment/Interventions  ADLs/Self Care Home Management;Cryotherapy;Moist Heat;Therapeutic exercise;Therapeutic activities;Functional mobility training;Stair training;Gait training;Ultrasound;DME Instruction;Balance training;Neuromuscular re-education;Patient/family education;Manual techniques;Taping;Passive range  of motion;Iontophoresis 4mg /ml Dexamethasone;Electrical Stimulation    PT Next Visit Plan  desensitize, gross strengthening.   MCR vs MCD    PT Home Exercise Plan  ankle AROM in all planes, try to put foot on floor. seated ball squeeze + LAQ,, sidelying hip abd, prone hip ext    Consulted and Agree with Plan of Care  Patient;Other (Comment)   Hardie Lora, [place] individual support      Patient will benefit from skilled therapeutic intervention in order to improve the following deficits and impairments:  Abnormal gait, Decreased endurance, Impaired sensation, Decreased activity tolerance, Decreased strength, Pain, Difficulty walking, Decreased mobility, Decreased balance, Decreased range of motion, Improper body mechanics, Impaired flexibility, Decreased coordination  Visit Diagnosis: Pain in right foot - Plan: PT plan of care cert/re-cert  Difficulty in walking, not elsewhere classified - Plan: PT plan of care cert/re-cert  Muscle weakness (generalized) - Plan: PT plan of care cert/re-cert  History of falling - Plan: PT plan of care cert/re-cert  Tendinitis - Plan: PT plan of care cert/re-cert     Problem List Patient Active Problem List   Diagnosis Date Noted  . ADHD (attention deficit hyperactivity disorder) 06/11/2013  . PTSD (post-traumatic stress disorder) 06/11/2013  . History of seizures 06/11/2013  . TBI (traumatic brain injury) ([place]) 06/11/2013   Franca Stakes C. Abisola Carrero PT, DPT 05/21/18 3:08 PM   [facility] [address] [place], [place], [address] Phone: [phone]   Fax:  [phone]  Name: Joy Barber MRN: [id number] Date of Birth: [birth year]

## 2018-05-26 ENCOUNTER — Institutional Professional Consult (permissible substitution): Payer: No Typology Code available for payment source | Admitting: Licensed Clinical Social Worker

## 2018-05-26 ENCOUNTER — Telehealth: Payer: Self-pay

## 2018-05-26 ENCOUNTER — Ambulatory Visit: Payer: Medicare Other | Attending: Family Medicine | Admitting: Licensed Clinical Social Worker

## 2018-05-26 DIAGNOSIS — F419 Anxiety disorder, unspecified: Secondary | ICD-10-CM

## 2018-05-26 NOTE — BH Specialist Note (Signed)
LCSWA met with pt in clinic with RN CM, Jane Brazaeu.   Pt received a letter regarding wage garnishment of disability benefits due to federal student loans in default. LCSWA contacted [employer] via telephone and spoke with Chris. Pt provided verbal permission for Chris to email an application for the Total and Permanent Disability (TPD) Discharge. The application was completed, faxed to [phone], and mailed.    Pt shared that she is no longer residing with her long-time friend due to ongoing conflict in the home. Pt has returned to a relative's residence and is interested in obtaining independent housing.   Plan of Action -Pt will open a bank account and obtain an account number to provide to Social Security for direct deposit of benefits.  -LCSWA will meet with pt at Social Security Office to assist with relaying information to Social Security   Joy Barber, LCSWA 05/28/18 6:07 PM  

## 2018-05-26 NOTE — Telephone Encounter (Signed)
Met with the patient when she was in the clinic today to meet with Christa See, LCSW/CHWC.  Assisted her with reviewing her disability award letter from social security administration and letter regarding her outstanding student loan from the Department of [employer].  She received her first disability check today and will plan to deposit it in the bank tomorrow. She will also inquire at the bank about how to proceed with managing her IRA and making deposits in the future.   She also completed an application to discharge her student loan with the [place].

## 2018-05-27 ENCOUNTER — Ambulatory Visit: Payer: Medicare Other | Admitting: Physical Therapy

## 2018-05-27 ENCOUNTER — Ambulatory Visit (INDEPENDENT_AMBULATORY_CARE_PROVIDER_SITE_OTHER): Payer: Medicaid Other | Admitting: Family

## 2018-05-28 ENCOUNTER — Telehealth: Payer: Self-pay | Admitting: Family Medicine

## 2018-05-28 ENCOUNTER — Telehealth: Payer: Self-pay | Admitting: Licensed Clinical Social Worker

## 2018-05-28 NOTE — Telephone Encounter (Signed)
Pt had an appointment with [employer] and desired assistance during her appointment. Social worker went to [employer] to meet pt for her appointment on 05/27/2018 at 1:30pm, but she changed her appointment till next week. Social worker will be attending appointment with pt next Wednesday 06/03/2018 at 2:00pm.   Dennison Mascot, MSW Intern 05/28/2018, 4:08pm

## 2018-05-28 NOTE — Telephone Encounter (Signed)
Please call patient back

## 2018-05-29 ENCOUNTER — Ambulatory Visit: Payer: No Typology Code available for payment source | Admitting: Family Medicine

## 2018-05-29 ENCOUNTER — Institutional Professional Consult (permissible substitution): Payer: No Typology Code available for payment source | Admitting: Licensed Clinical Social Worker

## 2018-06-01 ENCOUNTER — Telehealth: Payer: Self-pay | Admitting: Sports Medicine

## 2018-06-01 ENCOUNTER — Other Ambulatory Visit: Payer: Self-pay | Admitting: Internal Medicine

## 2018-06-01 NOTE — Telephone Encounter (Signed)
Pt wants a refill on Tramadol. She will be across the street at PT 06/02/18.

## 2018-06-01 NOTE — Telephone Encounter (Signed)
I told pt, I had received her message concerning a refill for the tramadol and that I knew how concise she like to be with her prescriptions and if she would let the receptionist know she was here I would get the tramadol prescription ready for her. Pt states she will be at PT about 1:00pm.

## 2018-06-02 ENCOUNTER — Ambulatory Visit: Payer: Medicare Other | Attending: Sports Medicine | Admitting: Physical Therapy

## 2018-06-02 ENCOUNTER — Encounter: Payer: Self-pay | Admitting: Physical Therapy

## 2018-06-02 ENCOUNTER — Encounter: Payer: No Typology Code available for payment source | Admitting: Physical Therapy

## 2018-06-02 DIAGNOSIS — M6281 Muscle weakness (generalized): Secondary | ICD-10-CM | POA: Diagnosis present

## 2018-06-02 DIAGNOSIS — Z9181 History of falling: Secondary | ICD-10-CM | POA: Insufficient documentation

## 2018-06-02 DIAGNOSIS — R262 Difficulty in walking, not elsewhere classified: Secondary | ICD-10-CM | POA: Diagnosis present

## 2018-06-02 DIAGNOSIS — M79671 Pain in right foot: Secondary | ICD-10-CM | POA: Insufficient documentation

## 2018-06-02 MED ORDER — TRAMADOL HCL 50 MG PO TABS: 50.0000 mg | ORAL_TABLET | Freq: Four times a day (QID) | ORAL | 0 refills | Status: DC | PRN

## 2018-06-02 MED FILL — NIFEDIPINE ER 30 MG TABLET: 30 | 30 days supply | Qty: 30 | Fill #0

## 2018-06-02 MED FILL — traMADol HCL 50 MG TABS: 50 | 7 days supply | Qty: 28 | Fill #0

## 2018-06-02 NOTE — Telephone Encounter (Signed)
I will ok 1 more refill of Tramadol but patient needs to go to pain management if she keeps requesting pain meds -Dr. Cannon Kettle

## 2018-06-02 NOTE — Telephone Encounter (Signed)
I spoke with pt and told her Dr. Cannon Kettle wanted to make sure PT was helping her with her pain and that if she needed more help so as not to have to be on pain medications, Dr. Cannon Kettle would refer her to Pain Management. Pt states she has an appt with Dr. Cannon Kettle in 2 weeks and will discuss at that time. Tramadol rx given to pt.

## 2018-06-02 NOTE — Progress Notes (Signed)
Thanks for the update! -Dr. Josafat Enrico 

## 2018-06-02 NOTE — Addendum Note (Signed)
Addended by: Harriett Sine D on: 06/02/2018 02:45 PM   Modules accepted: Orders

## 2018-06-02 NOTE — Therapy (Signed)
[facility] [address] [place], [place], [address] Phone: [phone]   Fax:  [phone]  Physical Therapy Treatment  Patient Details  Name: Joy Barber MRN: [id number] Date of Birth: [birth year] Referring Provider: Asencion Islam, DPM   Encounter Date: 06/02/2018  PT End of Session - 06/02/18 1334    Visit Number  3    Number of Visits  4    Date for PT Re-Evaluation  06/04/18    Authorization Type  MCR    PT Start Time  1330    PT Stop Time  1415    PT Time Calculation (min)  45 min    Activity Tolerance  Patient tolerated treatment well    Behavior During Therapy  Anxious       Past Medical History:  Diagnosis Date  . ADHD (attention deficit hyperactivity disorder)   . Allergy   . Anxiety   . Complication of anesthesia    "to ether anesthesia when I was 57 years old, woke up with N/V"  . GERD (gastroesophageal reflux disease)   . Head injury    post-concusive syndrome  . Headache   . Hypertension   . OCD (obsessive compulsive disorder)   . PTSD (post-traumatic stress disorder)   . Seizures ([facility])    s/p TBI  . Subdural hematoma ([facility]) sept 2012    Past Surgical History:  Procedure Laterality Date  . ABDOMINAL HYSTERECTOMY    . COSMETIC SURGERY  1975   forehead  . PLANTAR FASCIA RELEASE Right 05/22/2017   Procedure: excision PLANTAR fibromas;  Surgeon: Felecia Shelling, DPM;  [facility]: [facility];  Service: Podiatry;  Laterality: Right;    There were no vitals filed for this visit.  Subjective Assessment - 06/02/18 1336    Subjective  I was not able to do my exercises- moving and cannot multitask. Accidentaly did not wear boot and stood on foot which increased pain for hours. My alarm went off but I forgot to back for ionto patch 2 hours later. I do feel like it helped.     Patient Stated Goals  be able to walk without AD, decrease pain, improve balance- shower, bike    Currently in Pain?  Yes    Pain Score   6     Pain Location  Foot    Pain Orientation  Right    Pain Descriptors / Indicators  --   stinging                      [facility] Adult PT Treatment/Exercise - 06/02/18 0001      Exercises   Exercises  Ankle      Modalities   Modalities  Electrical Stimulation      Electrical Stimulation   Electrical Stimulation Location  Rt foot    Electrical Stimulation Action  IFC     Electrical Stimulation Parameters  10 min to tolerance- attended    Electrical Stimulation Goals  Pain      Iontophoresis   Type of Iontophoresis  Dexamethasone    Location  plantar aspect of Rt foot    Dose  1cc    Time  6 hr wear      Ankle Exercises: Stretches   Other Stretch  great toe extension      Ankle Exercises: Seated   Towel Crunch Limitations  seated with feet flat    Heel Raises Limitations  ball bw knees to avoid eversion  cues to keep weight over ball of foot   Other Seated Ankle Exercises  feet on floor- wieght through elbows for increased weight    Other Seated Ankle Exercises  pool noodle roller             PT Education - 06/02/18 1517    Education Details  exercise form/rationale, HEP, ESTIM, ionto    Person(s) Educated  Patient    Methods  Explanation;Demonstration;Tactile cues;Verbal cues;Handout    Comprehension  Verbalized understanding;Returned demonstration;Verbal cues required;Tactile cues required;Need further instruction       PT Short Term Goals - 05/12/18 1122      PT SHORT TERM GOAL #1   Title  pt will be able to demo HEP without requiring cues for form    Baseline  began establishing short term HEP at eval    Time  3    Period  Weeks    Status  New    Target Date  05/29/18      PT SHORT TERM GOAL #2   Title  pt will be able to lay the plantar surface of her foot on the floor without weight bearing    Baseline  unable to tolerate tactile sensation at eval    Time  3    Period  Weeks    Status  New    Target Date  05/29/18        PT  Long Term Goals - 05/12/18 1123      PT LONG TERM GOAL #1   Title  Pt will demo DF to at least 5 deg for required ROM during ambulation    Baseline  demo AROM shy of neutral at eval    Time  10   time to accommodate for MCD authorization periods   Period  Weeks    Status  New    Target Date  07/24/18      PT LONG TERM GOAL #2   Title  Pt will be able to balance enough to feel safe taking a shower on an acceptable basis for her    Baseline  reports 10 day gaps in showering due to poor balance and risk of falling    Time  10    Period  Weeks    Status  New    Target Date  07/24/18      PT LONG TERM GOAL #3   Title  pt will be able to utilize both feet on the ground for ambulation with SPC to move around her home    Baseline  home not appropriate for rollator, unable to place weight on Rt foot past most post aspect of calcaneus with severe pain, leaning onto [facility]    Time  10    Period  Weeks    Status  New    Target Date  07/24/18      PT LONG TERM GOAL #4   Title  pt will report pain levels <=4/10 with functional daily ambulation    Baseline  6/10 at rest following pain meds at eval    Time  10    Period  Weeks    Status  New    Target Date  07/24/18             Plan - 06/02/18 1518    Clinical Impression Statement  Pt very frustrated by living situation because she cannot focus and will not return to PT until the 25th after she moves- she is currently  a 90 min walk to a bus stop. Was able to tolerate a little weight through her foot while seated and placed foot on pillow which contacted the entire bottom of her foot. Pt worried about purple coloration of incision site at the bottom of her foot which I advised is normal. She says she is beginning an open wound on her Left foot where the ASO is rubbing. Frequent reminders to keep patient on task. She did like ESTIM and requested iontophoresis again- she left a message after last treatment saying her foot was burning but she  felt it was helpful. Asked her to do as much as she is able with her foot. complains of medial Lt knee pain- when watching her walk, she is placing her Rt knee on rollator and externally rotating her Lt foot to propel but she does not want to try to change it at this time because that thought was overwhelming at this time and she wants to try to focus on her Rt foot.     PT Treatment/Interventions  ADLs/Self Care Home Management;Cryotherapy;Moist Heat;Therapeutic exercise;Therapeutic activities;Functional mobility training;Stair training;Gait training;Ultrasound;DME Instruction;Balance training;Neuromuscular re-education;Patient/family education;Manual techniques;Taping;Passive range of motion;Iontophoresis 4mg /ml Dexamethasone;Electrical Stimulation    PT Next Visit Plan  desensitize, gross strengthening.      PT Home Exercise Plan  ankle AROM in all planes, try to put foot on floor. seated ball squeeze + LAQ,, sidelying hip abd, prone hip ext; roller to plantar aspect of foot, great toe extension stretch    Consulted and Agree with Plan of Care  Patient;Other (Comment)    Family Member Consulted  Mariella Saa, [place] individual support       Patient will benefit from skilled therapeutic intervention in order to improve the following deficits and impairments:  Abnormal gait, Decreased endurance, Impaired sensation, Decreased activity tolerance, Decreased strength, Pain, Difficulty walking, Decreased mobility, Decreased balance, Decreased range of motion, Improper body mechanics, Impaired flexibility, Decreased coordination  Visit Diagnosis: Pain in right foot  Difficulty in walking, not elsewhere classified  Muscle weakness (generalized)  History of falling     Problem List Patient Active Problem List   Diagnosis Date Noted  . ADHD (attention deficit hyperactivity disorder) 06/11/2013  . PTSD (post-traumatic stress disorder) 06/11/2013  . History of seizures 06/11/2013  . TBI  (traumatic brain injury) ([facility]) 06/11/2013   Emeri Estill C. Jadian Karman PT, DPT 06/02/18 3:28 PM   [facility] [address] [place], [place], [address] Phone: [phone]   Fax:  [phone]  Name: Joy Barber MRN: [id number] Date of Birth: 1960/11/28

## 2018-06-03 ENCOUNTER — Telehealth: Payer: Self-pay | Admitting: Licensed Clinical Social Worker

## 2018-06-03 ENCOUNTER — Ambulatory Visit (INDEPENDENT_AMBULATORY_CARE_PROVIDER_SITE_OTHER): Payer: Medicare Other | Admitting: Family

## 2018-06-03 ENCOUNTER — Encounter (INDEPENDENT_AMBULATORY_CARE_PROVIDER_SITE_OTHER): Payer: Self-pay | Admitting: Family

## 2018-06-03 VITALS — Ht 64.0 in | Wt 159.0 lb

## 2018-06-03 DIAGNOSIS — M25562 Pain in left knee: Secondary | ICD-10-CM | POA: Diagnosis not present

## 2018-06-03 DIAGNOSIS — M25572 Pain in left ankle and joints of left foot: Secondary | ICD-10-CM | POA: Diagnosis not present

## 2018-06-03 DIAGNOSIS — G8929 Other chronic pain: Secondary | ICD-10-CM

## 2018-06-03 NOTE — Telephone Encounter (Deleted)
MSW Intern met pt at [employer] to accompany pt to her appointment.

## 2018-06-03 NOTE — Progress Notes (Signed)
Office Visit Note   Patient: Joy Barber           Date of Birth: Dec 28, 1960           MRN: [id number] Visit Date: 06/03/2018              Requested by: Azzie Glatter, [address] [place], [place] [id number] PCP: No primary care provider on file.  Chief Complaint  Patient presents with  . Left Knee - Follow-up  . Left Ankle - Pain      HPI: The patient is a 57 year old woman who presents today with a chaperone.  She is status post traumatic brain chaperone medical visits.  Is seen today in follow-up for complaints of left knee and left ankle pain.  These are both chronic.  Been ongoing the day before Christmas of last year.    Of note the patient follows with podiatry and does have a short walking boot on her right foot.  States she will not be able to get out of her cam walker on the right until next year.   Is using a rolling walker with a seat.  States she has been unable to work on any stretching is unable to take anti-inflammatories for her left Achilles tendinitis.  Did stop wearing the heel lift states this was uncomfortable in her shoe wear.  States she is unable to afford new sneakers but is thinking about doing so.  Continues to wear a knee brace on the left for support states she is pain-free when she is wearing her brace however when she does not wear the brace she has twinges of knee pain points to the patellar tendon as painful area.    When asked why she is here for visit today.  States I do not know you may need follow-up.  Will be unable to work on any of the exercises or interventions I have offered at last visit until next year.  States she is unable to change her current therapies until she gets out of her cam walker on the right.  Has been for the left ankle which provide support.  States does not have any Achilles tendon pain when wearing the brace.  Would like to continue with this.  Feels she cannot progress out of the brace or the knee brace until  she is no longer using the rolling walker.  Would like to follow-up in office as needed.  States she does have some NSAID patches that she will begin using on her left ankle.    Assessment & Plan: Visit Diagnoses:  1. Chronic pain of left knee   2. Pain of joint of left ankle and foot     Plan: Discussed using anti-inflammatories for her Achilles tendinitis.  Discussed with begin doing this when she feels she is able to discontinue the brace.  She will follow-up in office as needed.  The left knee brace for support as needed.  Follow-Up Instructions: Return if symptoms worsen or fail to improve.   Left Ankle Exam  Left ankle exam is normal. Swelling: none  Range of Motion  The patient has normal left ankle ROM.   Other  Erythema: absent Pulse: present   Right Knee Exam   Muscle Strength  The patient has normal right knee strength.  Tenderness  Right knee tenderness location: global.  Range of Motion  The patient has normal right knee ROM.  Tests  Varus: negative Valgus: negative  Other  Erythema: absent Swelling: mild Effusion: no effusion present      Patient is alert, oriented, no adenopathy, well-dressed, normal affect, normal respiratory effort. On examination of the left ankle she does have mild tenderness to the Achilles.  No appreciable swelling no erythema.  However there are no palpable defects. Negative thompsons  Imaging: No results found. No images are attached to the encounter.  Labs: Lab Results  Component Value Date   REPTSTATUS 03/17/2010 FINAL 03/15/2010   CULT  03/15/2010    GROUP B STREP(S.AGALACTIAE)ISOLATED Note: TESTING AGAINST S. AGALACTIAE NOT ROUTINELY PERFORMED DUE TO PREDICTABILITY OF AMP/PEN/VAN SUSCEPTIBILITY.     Lab Results  Component Value Date   ALBUMIN 4.4 05/15/2017   ALBUMIN 4.5 04/23/2017   ALBUMIN 4.3 02/12/2017    Body mass index is 27.29 kg/m.  Orders:  No orders of the defined types were placed in  this encounter.  No orders of the defined types were placed in this encounter.    Procedures: No procedures performed  Clinical Data: No additional findings.  ROS:  All other systems negative, except as noted in the HPI. Review of Systems  Constitutional: Negative for chills and fever.  Musculoskeletal: Positive for arthralgias and myalgias.    Objective: Vital Signs: Ht 5\' 4"  (1.626 m)   Wt 159 lb (72.1 kg)   BMI 27.29 kg/m   Specialty Comments:  No specialty comments available.  PMFS History: Patient Active Problem List   Diagnosis Date Noted  . ADHD (attention deficit hyperactivity disorder) 06/11/2013  . PTSD (post-traumatic stress disorder) 06/11/2013  . History of seizures 06/11/2013  . TBI (traumatic brain injury) ([place]) 06/11/2013   Past Medical History:  Diagnosis Date  . ADHD (attention deficit hyperactivity disorder)   . Allergy   . Anxiety   . Complication of anesthesia    "to ether anesthesia when I was 57 years old, woke up with N/V"  . GERD (gastroesophageal reflux disease)   . Head injury    post-concusive syndrome  . Headache   . Hypertension   . OCD (obsessive compulsive disorder)   . PTSD (post-traumatic stress disorder)   . Seizures ([place])    s/p TBI  . Subdural hematoma ([place]) sept 2012    Family History  Problem Relation Age of Onset  . Cancer Mother        breast  . Diabetes Father   . Cancer Father        testicular  . Hypertension Brother   . Heart disease Brother     Past Surgical History:  Procedure Laterality Date  . ABDOMINAL HYSTERECTOMY    . COSMETIC SURGERY  1975   forehead  . PLANTAR FASCIA RELEASE Right 05/22/2017   Procedure: excision PLANTAR fibromas;  Surgeon: Edrick Kins, DPM;  Location: [facility];  Service: Podiatry;  Laterality: Right;   Social History   Occupational History  . Occupation: house cleaning  Tobacco Use  . Smoking status: Current Some Day Smoker    Types: Cigars  . Smokeless tobacco: Former  Network engineer and Sexual Activity  . Alcohol use: No  . Drug use: Yes    Frequency: 7.0 times per week    Types: Marijuana    Comment: uses CBD oil  . Sexual activity: Yes    Birth control/protection: Surgical

## 2018-06-03 NOTE — Telephone Encounter (Signed)
MSW Intern met pt at [employer] on today, 06/03/18 at 2:00pm, for her appointment. Intern accompanied pt for support through appointment. Pt presented in calm tone with an appropriate affect. Intern assisted pt with rescheduling an appointment to her bank. MSW intern will attend scheduled bank appointment with pt on Wednesday 06/10/18 at 2:00pm and will also attend the social security office with pt on Wednesday 06/10/18 at 9:00am.   Pt also expressed her concern for her medicaid. Intern will follow-up with case manager regarding medicaid concerns. Pt also complained of persistent headaches. While waiting to be called to see the specialist, pt rescheduled appointment with PCP to a further date.   Dennison Mascot, MSW Intern 06/03/18, 4:20pm

## 2018-06-04 ENCOUNTER — Ambulatory Visit: Payer: Medicare Other | Attending: Family Medicine | Admitting: Licensed Clinical Social Worker

## 2018-06-04 ENCOUNTER — Ambulatory Visit: Payer: No Typology Code available for payment source | Admitting: Licensed Clinical Social Worker

## 2018-06-04 DIAGNOSIS — F419 Anxiety disorder, unspecified: Secondary | ICD-10-CM

## 2018-06-05 ENCOUNTER — Encounter: Payer: No Typology Code available for payment source | Admitting: Physical Therapy

## 2018-06-05 NOTE — BH Specialist Note (Signed)
Integrated Behavioral Health Follow Up Visit  [place] met with pt in clinic.  Pt reports increased anxiety about the loss of her Independent Support Specialist due to being transferred to Medicare coverage. She states that she has racing thoughts and decreased concentration.   Bryant educated pt on [place] of needs and validated pt's feelings due to not having basic needs met. Pt was informed of different strategies to assist pt with strengthening concentration and provided pt opportunity to practice utilizing interventions to encourage use in the community.  Pt called [employer]ermanent Disability Office to follow up on status of application. She was informed that the application has been received and is currently being reviewed. A letter from the department has been mailed to confirm information shared during phone call.   Pt called Radene Gunning, Supervisor of [employer] [phone]. Ms. Minette Brine confirmed that their program is a Medicaid funded; however, agreed to schedule an appointment with pt's case worker to discuss additional resources that pt may qualify for with Medicare coverage.     Plan of Action -Pt will provide bank account information to Social Security for direct deposit of benefits. MSW Intern will meet with pt at [employer] to assist with relaying information to [employer]

## 2018-06-09 ENCOUNTER — Ambulatory Visit: Payer: Medicaid Other | Admitting: Sports Medicine

## 2018-06-09 ENCOUNTER — Ambulatory Visit: Payer: Medicare Other | Admitting: Physical Therapy

## 2018-06-10 ENCOUNTER — Telehealth: Payer: Self-pay | Admitting: Licensed Clinical Social Worker

## 2018-06-10 NOTE — Telephone Encounter (Addendum)
SW Intern met pt at bank to support her through process of gaining account information. Pt also called Social Security Administration to provide them with account number. Pt desired to pick mail up from [facility], but stated that she will pick it up next week. Pt stated that she has been in the process of packing and desires to plan for housing. She also desires to have a further understanding of her medicare benefits.  Pt has an appt with SW intern on Thursday, 06/18/2018. Pt also desires to speak with the case manager.   Dennison Mascot, MSW Intern 06/10/18, 4:48pm

## 2018-06-12 ENCOUNTER — Ambulatory Visit: Payer: No Typology Code available for payment source | Admitting: Family Medicine

## 2018-06-16 ENCOUNTER — Ambulatory Visit (INDEPENDENT_AMBULATORY_CARE_PROVIDER_SITE_OTHER): Payer: Medicare Other | Admitting: Sports Medicine

## 2018-06-16 ENCOUNTER — Telehealth: Payer: Self-pay

## 2018-06-16 ENCOUNTER — Encounter: Payer: Self-pay | Admitting: Sports Medicine

## 2018-06-16 DIAGNOSIS — R52 Pain, unspecified: Secondary | ICD-10-CM

## 2018-06-16 DIAGNOSIS — M722 Plantar fascial fibromatosis: Secondary | ICD-10-CM | POA: Diagnosis not present

## 2018-06-16 DIAGNOSIS — M25471 Effusion, right ankle: Secondary | ICD-10-CM

## 2018-06-16 DIAGNOSIS — L905 Scar conditions and fibrosis of skin: Secondary | ICD-10-CM

## 2018-06-16 DIAGNOSIS — Z9889 Other specified postprocedural states: Secondary | ICD-10-CM

## 2018-06-16 DIAGNOSIS — M779 Enthesopathy, unspecified: Secondary | ICD-10-CM

## 2018-06-16 DIAGNOSIS — S93401A Sprain of unspecified ligament of right ankle, initial encounter: Secondary | ICD-10-CM

## 2018-06-16 MED ORDER — TRAMADOL HCL 50 MG PO TABS: 50.0000 mg | ORAL_TABLET | Freq: Four times a day (QID) | ORAL | 0 refills | Status: DC | PRN

## 2018-06-16 NOTE — Telephone Encounter (Signed)
Met with the patient when she came to the clinic today to drop off a prescription at the pharmacy. She said that she is not sure that she will be able to keep her appointment with this CM and [name], SW intern on 06/18/18 so she cancelled it. She said that she has PT tomorrow and thinks she may need to rest her foot on Thursday, 06/18/18.  This CM explained to her that she can call tomorrow or even Thursday morning and reschedule if she thinks she can make it to the clinic.  She spoke about her concerns regarding finding housing, questions about her insurance coverage for her medications as well as a $1.00 charge she receives for each transaction she makes with her bank. She explained how she is overwhelmed with these concerns. This CM again reminded her that she can see how she feels on 06/18/18 and always come in that day to meet with this CM and [name].

## 2018-06-16 NOTE — Progress Notes (Signed)
Subjective:  Joy Barber is a 57 y.o. female patient who returns to office for follow up evaluation of right and now left foot pain. Reports that she has pain now that she is doing well with physical therapy and that her left heel has been hurting as well states still sensitivity to the right arch but is concerned because she was told last visit when she came to pick up tramadol that she will need to be going to pain management.  Patient states that she is still using her CBD oil and only wants me to be her main provider as far as writing her pain medicine.  Patient denies any other symptoms at this time.   Patient Active Problem List   Diagnosis Date Noted  . ADHD (attention deficit hyperactivity disorder) 06/11/2013  . PTSD (post-traumatic stress disorder) 06/11/2013  . History of seizures 06/11/2013  . TBI (traumatic brain injury) ([place]) 06/11/2013    Current Outpatient Medications on File Prior to Visit  Medication Sig Dispense Refill  . acetaminophen-codeine (TYLENOL #3) 300-30 MG tablet Take 1 tablet by mouth every 6 (six) hours as needed for moderate pain. Alternate with Tramadol as needed. DO NOT FILL BEFORE 3/28 30 tablet 0  . Ascorbic Acid (VITAMIN C PO) Take 1 tablet by mouth daily.    Marland Kitchen azithromycin (ZITHROMAX) 250 MG tablet Take 2 tabs PO x 1 dose, then 1 tab PO QD x 4 days 6 tablet 0  . busPIRone (BUSPAR) 15 MG tablet TAKE 2 TABLETS BY MOUTH TWICE DAILY 120 tablet 2  . busPIRone (BUSPAR) 7.5 MG tablet Take 4 tablets (30 mg total) by mouth 2 (two) times daily. 240 tablet 3  . diclofenac (FLECTOR) 1.3 % PTCH Place 1 patch onto the skin 2 (two) times daily. 30 patch 0  . diphenhydramine-acetaminophen (TYLENOL PM) 25-500 MG TABS tablet Take 1 tablet by mouth every morning.    . gabapentin (NEURONTIN) 300 MG capsule TAKE 1 CAPSULE BY MOUTH 3 TIMES DAILY. 90 capsule 1  . gentamicin cream (GARAMYCIN) 0.1 % Apply 1 application topically 3 (three) times daily. 30 g 1  .  hydrochlorothiazide (HYDRODIURIL) 25 MG tablet Take 1 tablet (25 mg total) by mouth daily. (Patient taking differently: Take 25 mg by mouth daily as needed (for edema). ) 90 tablet 3  . hydrOXYzine (VISTARIL) 50 MG capsule TAKE 2 CAPSULES BY MOUTH 3 TIMES DAILY AS NEEDED FOR ANXIETY. 90 capsule 2  . NIFEdipine (PROCARDIA-XL/ADALAT-CC/NIFEDICAL-XL) 30 MG 24 hr tablet TAKE 1 TABLET BY MOUTH ONCE DAILY 90 tablet 0  . NON FORMULARY Piracet 800 mg tablets (memory aid): Take 1 tablet by mouth once a day or as otherwise instructed    . Omega-3 Fatty Acids (FISH OIL PO) Take 1 capsule by mouth daily.    . salicylic acid 17 % gel Apply topically daily. 15 g 0  . sodium chloride (OCEAN) 0.65 % SOLN nasal spray Place 1-2 sprays into both nostrils 3 (three) times daily as needed for congestion.     Current Facility-Administered Medications on File Prior to Visit  Medication Dose Route Frequency Provider Last Rate Last Dose  . betamethasone acetate-betamethasone sodium phosphate (CELESTONE) injection 3 mg  3 mg Intramuscular Once Edrick Kins, DPM        Allergies  Allergen Reactions  . Cephalosporins Itching  . Nsaids Other (See Comments)    History of subdural hematoma  . Penicillins Itching and Other (See Comments)    Welts (also) Has patient had  a PCN reaction causing immediate rash, facial/tongue/throat swelling, SOB or lightheadedness with hypotension: Yes Has patient had a PCN reaction causing severe rash involving mucus membranes or skin necrosis: No Has patient had a PCN reaction that required hospitalization: Yes Has patient had a PCN reaction occurring within the last 10 years: No If all of the above answers are "NO", then may proceed with Cephalosporin use.     Objective:  General: Alert and oriented x3 in no acute distress  Dermatology: + thick scar/keloid formation at plantar right foot that is hyper sensitive to touch as previous  Vascular: Dorsalis Pedis and Posterior Tibial  pedal pulses palpable, Capillary Fill Time 3 seconds,(+) pedal hair growth bilateral, minimal focal edema right foot and ankle, Temperature gradient within normal limits.  Neurology: Gross sensation intact via light touch bilateral. (- )Tinels sign bilateral. Subjective burning pain on right.     Musculoskeletal: Mild tenderness diffusely across right foot with most pain per patient at previous surgical site/fibroma excision site that has a keloid to area as above, there is also pain at the posterior left heel along the watershed of the Achilles with mild nodularity however the Achilles feels intact and there is negative [place] Silos sign this is likely thickening secondary to inflammation which could be secondary to compensation for her utilizing pushing with her left foot when she uses her rolling knee scooter that has a lot of heavy objects and belongings on it, no pain with calf compression bilateral.  Range of motion within normal limits with mild guarding on right foot and pain to left heel.  Gait: Antalgic gait rolling walker assisted.   Assessment and Plan: Problem List Items Addressed This Visit    None    Visit Diagnoses    Painful scar    -  Primary   Plantar fascial fibromatosis       Post-operative state       Tendonitis       Sprain of right ankle, unspecified ligament, initial encounter       Ankle swelling, right          -Complete examination performed -Discussed treatement options for control of pain at scar and tendinitis on left -Continue Flector patch to use at painful scar and to secure in place with coban on right and apply a small patch on the left heel secured with paper tape and using Achilles pad sleeve as dispensed -Continue PT  -Continue with short CAM boot on right and ankle support on left -Continue with rest, ice, elevation and rolling walker  -Recommend Behavioral health for further eval/treament with peer support as previous and advised patient that this is  the last prescription of tramadol that I will write of which she can get filled on June 23, 2018 and recommend patient see pain management.  I advised patient that I cannot continue to write for her pain medications because in November this will be a one-year process since she has been seeing me postop surgery with Dr. Amalia Hailey and has still been requesting medication for pain.  If patient requires a refill or need additional pain medicine after October 1 will refer to pain management. -Patient to return to office PRN or sooner if condition worsens.  Landis Martins, DPM

## 2018-06-17 ENCOUNTER — Telehealth: Payer: Self-pay

## 2018-06-17 ENCOUNTER — Ambulatory Visit: Payer: Medicare Other | Admitting: Physical Therapy

## 2018-06-17 ENCOUNTER — Telehealth: Payer: Self-pay | Admitting: Licensed Clinical Social Worker

## 2018-06-17 NOTE — Telephone Encounter (Signed)
Pt called to inform me about her appt for physical therapy on today 06/17/18 @ 9:30am and that her peer support specialist did not show up. Pt reports that peer support specialist initially called to say that she would be 15 minutes late but she did not show up after pt waited an extra 30 minutes. Pt rescheduled physical therapy appt for 07/01/18 @ 11:45am.   Dennison Mascot, MSW Intern 06/17/18, 10:54am

## 2018-06-17 NOTE — Telephone Encounter (Signed)
As per [name], [employer] [place], they have closed the case.

## 2018-06-18 ENCOUNTER — Ambulatory Visit: Payer: Medicare Other | Admitting: Licensed Clinical Social Worker

## 2018-06-19 ENCOUNTER — Telehealth: Payer: Self-pay | Admitting: Licensed Clinical Social Worker

## 2018-06-19 NOTE — Telephone Encounter (Signed)
MSW Intern contacted pt to reschedule appt. Pt mentioned concerns about her banking information. Pt made an appt with her bank this upcoming week. Pt also has concerns about trusting her therapist with [facility] and her individual support.   Dennison Mascot, MSW Intern 06/19/18, 12:48 PM

## 2018-06-23 ENCOUNTER — Ambulatory Visit: Payer: Medicare Other | Admitting: Family Medicine

## 2018-06-23 ENCOUNTER — Other Ambulatory Visit: Payer: Self-pay | Admitting: Family Medicine

## 2018-06-23 MED FILL — HYDROXYZINE PAM 50 MG CAP: 50 | 15 days supply | Qty: 90 | Fill #1

## 2018-06-23 MED FILL — traMADol HCL 50 MG TABS: 50 | 7 days supply | Qty: 28 | Fill #0

## 2018-06-23 MED FILL — busPIRone HCL 15 MG TABS: 15 | 30 days supply | Qty: 120 | Fill #2

## 2018-06-24 ENCOUNTER — Other Ambulatory Visit: Payer: Self-pay | Admitting: Family Medicine

## 2018-06-24 ENCOUNTER — Telehealth: Payer: Self-pay | Admitting: Licensed Clinical Social Worker

## 2018-06-24 ENCOUNTER — Telehealth: Payer: Self-pay

## 2018-06-24 MED ORDER — GABAPENTIN 300 MG PO CAPS: 300.0000 mg | ORAL_CAPSULE | Freq: Three times a day (TID) | ORAL | 0 refills | Status: DC

## 2018-06-24 MED FILL — GABAPENTIN 300 MG CAPSULE: 300 | 10 days supply | Qty: 30 | Fill #0

## 2018-06-24 NOTE — Progress Notes (Signed)
Patient presented to office requesting gabapentin. Refilled for 30 tablets. She has an appointment with Dr. Chapman Fitch on Monday

## 2018-06-24 NOTE — Telephone Encounter (Signed)
Contacted pt to remind about appt with bank today. Pt informed me of issues she needed to discuss with bank and that she was coming to pick her medication up today.   Dennison Mascot, MSW Intern 06/24/18, 10:25 PM

## 2018-06-24 NOTE — Telephone Encounter (Signed)
Met with the patient when she was in the clinic today for an appointment with Dennison Mascot, [facility] SW intern.  The patient spoke about her need for housing. She is considering applying at the [facility] on [address]. She said that she has stayed there before and has been in contact with the facility and was informed that you do not have to be a student to reside there. She also has information about a program with [place] called [facility] [employer] , and she hopes to find out more about that program tomorrow

## 2018-06-25 ENCOUNTER — Telehealth: Payer: Self-pay | Admitting: Sports Medicine

## 2018-06-25 NOTE — Telephone Encounter (Signed)
Left message informing pt that with the weather being so hot and her being on her feet more often that would cause swelling to the feet and the redness, to rest, elevate and ice and to call again.

## 2018-06-25 NOTE — Telephone Encounter (Signed)
Pt called stating she has been on her feet more than usual this week and is having some issues with it swelling and redness. She is scheduled to see Dr Cannon Kettle on 10.15.19 but pt was asking what she should do, Should she come in sooner, or come in and see you or JQ fot an unna boot. Please call pt back. Pt only wants to talk with you or JQ

## 2018-06-25 NOTE — Telephone Encounter (Signed)
MSW Intern met pt at bank to assist with appt. Pt was inquiring about POS charge, how to check balances, and withdrawal. Pt was having a difficult time focusing on the tasks that she wanted to accomplish at the bank. However, MSW Intern assisted patient with refocusing and deep breathing. Pt checked her credit score at bank to have starting point for getting housing.   Pt came to clinic to pickup medicine and to schedule an appt with MSW intern to apply for housing at the district. Pt shared that she received a letter from [facility] about in-reach program for housing individuals with severe mental illnesses. MSW intern and case manager attempted to research the in-reach program but could not locate any information on it. Pt stated that she would contact [facility] to learn more information about in-reach program. Pt stated that she still wants to apply for the district to determine if her past eviction notice will effect her getting an apartment. Pt scheduled an appt for Wed Oct 9, @ 2:00 PM to complete the district application.   When pt came to pick-up medication she was missing Gabapentin. Pt stated that she needed the medication but pharmacy initially could not fill it because she had not seen a provider yet. However, pharmacy was able to contact past provider to get pt a temporary prescription till Monday.    Pt reports that she is still concerned about the individual support person from [place] that is supposed to accompany her to appt's. Pt is now wondering whether the individual support person works at [employer]. Pt was frustrated when discussing her because she has not shown up to recent appt's.   Dennison Mascot, MSW Intern 06/25/18, 9:16 PM

## 2018-06-26 ENCOUNTER — Telehealth: Payer: Self-pay | Admitting: Family Medicine

## 2018-06-26 ENCOUNTER — Ambulatory Visit: Payer: Medicare Other | Admitting: Licensed Clinical Social Worker

## 2018-06-26 NOTE — Telephone Encounter (Signed)
Patient called because she wants to speak to you. She says Her foot is swollen and her Dr. told her to keep off her feet.

## 2018-06-29 ENCOUNTER — Ambulatory Visit: Payer: Medicare Other | Admitting: Family Medicine

## 2018-06-29 ENCOUNTER — Ambulatory Visit: Payer: Medicare Other | Admitting: Licensed Clinical Social Worker

## 2018-06-30 ENCOUNTER — Telehealth: Payer: Self-pay | Admitting: Licensed Clinical Social Worker

## 2018-06-30 ENCOUNTER — Ambulatory Visit: Payer: Medicare Other | Admitting: Physical Therapy

## 2018-06-30 NOTE — Telephone Encounter (Signed)
Return call received from patient. She stated that she has been feeling under the weather and her right foot is swollen. It was recommended by pt's specialist provider that she engages in rest and elevate her leg. Pt stated that she has canceled her appointments from yesterday and today.   Pt reports that she is unable to focus on establishing care with a PCP due to having to obtain independent housing in the next two weeks. She has an appointment with Physical Therapy tomorrow, in addition, to MSW Intern, Claudia Pollock' McCoy to assist with housing resources. No additional concerns were noted.

## 2018-06-30 NOTE — Telephone Encounter (Signed)
LCSWA attempted to contact pt via telephone. A message was left requesting a return call.

## 2018-07-01 ENCOUNTER — Telehealth: Payer: Self-pay | Admitting: *Deleted

## 2018-07-01 ENCOUNTER — Encounter: Payer: Self-pay | Admitting: Physical Therapy

## 2018-07-01 ENCOUNTER — Ambulatory Visit: Payer: Medicare Other | Attending: Sports Medicine | Admitting: Physical Therapy

## 2018-07-01 ENCOUNTER — Ambulatory Visit: Payer: Medicare Other | Attending: Family Medicine | Admitting: Licensed Clinical Social Worker

## 2018-07-01 DIAGNOSIS — Z9181 History of falling: Secondary | ICD-10-CM | POA: Diagnosis not present

## 2018-07-01 DIAGNOSIS — M779 Enthesopathy, unspecified: Secondary | ICD-10-CM | POA: Diagnosis not present

## 2018-07-01 DIAGNOSIS — M79671 Pain in right foot: Secondary | ICD-10-CM | POA: Insufficient documentation

## 2018-07-01 DIAGNOSIS — M6281 Muscle weakness (generalized): Secondary | ICD-10-CM | POA: Insufficient documentation

## 2018-07-01 DIAGNOSIS — R262 Difficulty in walking, not elsewhere classified: Secondary | ICD-10-CM | POA: Diagnosis not present

## 2018-07-01 DIAGNOSIS — Z599 Problem related to housing and economic circumstances, unspecified: Secondary | ICD-10-CM

## 2018-07-01 NOTE — Telephone Encounter (Signed)
Pt presented to office and informed R. Komonski - front receptionist, that she just wanted to let us know her feet were swollen and she was going to PT.

## 2018-07-01 NOTE — BH Specialist Note (Signed)
Integrated Behavioral Health Follow Up Visit °  °MRN: [id number] °Name: Joy Barber ° ° °MSW Intern met with pt at physical therapy. Pt came back to [facility] to meet with MSW Intern and care manager. Pt reports that she needs assistance with getting her medical co-pays decreased. Care manager informed pt about legal aid being able to assist with that process. ° °Pt presents to session with concerns about housing. MSW Intern contacted [facility] apartments and the management company for [address] apartments. Both complexes stated that they require applicants to make a minimum of 3 tines the rent/month. MSW Intern also contacted the District on [address] and they stated that all applicants must be students. Pt stated that she learned about [address] apartments and there is a wait-list of six people ahead of her. MSW Intern informed pt about continuing to locate apartments that she can potentially apply for.  ° °Pt scheduled upcoming appt with LCSWA to follow-up with pt.  ° °Plan: °Pt will follow-up with LCSWA and MSW Intern on housing concerns. Care manager will contact legal aid for pt.  ° °[name]' McCoy, MSW Intern °07/01/18, 4:06 PM °

## 2018-07-01 NOTE — Therapy (Signed)
[facility] [address] [place], [place], [address] Phone: [phone]   Fax:  [phone]  Physical Therapy Treatment/ERO  Patient Details  Name: Joy Barber MRN: [id number] Date of Birth: 1961/05/21 Referring Provider (PT): Asencion Islam, [place]   Encounter Date: 07/01/2018  PT End of Session - 07/01/18 1153    Visit Number  4    Number of Visits  10    Date for PT Re-Evaluation  08/14/18    Authorization Type  MCR    PT Start Time  1151    PT Stop Time  1230    PT Time Calculation (min)  39 min    Activity Tolerance  Patient tolerated treatment well    Behavior During Therapy  [facility] for tasks assessed/performed;Restless       Past Medical History:  Diagnosis Date  . ADHD (attention deficit hyperactivity disorder)   . Allergy   . Anxiety   . Complication of anesthesia    "to ether anesthesia when I was 57 years old, woke up with N/V"  . GERD (gastroesophageal reflux disease)   . Head injury    post-concusive syndrome  . Headache   . Hypertension   . OCD (obsessive compulsive disorder)   . PTSD (post-traumatic stress disorder)   . Seizures (HCC)    s/p TBI  . Subdural hematoma (HCC) sept 2012    Past Surgical History:  Procedure Laterality Date  . ABDOMINAL HYSTERECTOMY    . COSMETIC SURGERY  1975   forehead  . PLANTAR FASCIA RELEASE Right 05/22/2017   Procedure: excision PLANTAR fibromas;  Surgeon: Felecia Shelling, DPM;  Location: [facility] OR;  Service: Podiatry;  Laterality: Right;    There were no vitals filed for this visit.  Subjective Assessment - 07/01/18 1153    Subjective  Exercises are on and off, trying to find a new place to live. took CBD oil and tried walking on it and it about killed me. Did not have on shoes. A lot of swelling over the last few days because I was up for about 6 hours a day.     Patient Stated Goals  be able to walk without AD, decrease pain, improve balance- shower, bike    Currently in Pain?  Yes    Pain Score  7     Pain Location  Foot    Pain Orientation  Right    Pain Descriptors / Indicators  Sharp    Aggravating Factors   weight bearing, being upright    Pain Relieving Factors  rest, ice         [facility] PT Assessment - 07/01/18 0001      Assessment   Medical Diagnosis  tendinitis    Referring Provider (PT)  Asencion Islam, DPM    Onset Date/Surgical Date  05/22/18      Sensation   Additional Comments  numbness in great toe      Strength   Overall Strength Comments  gross 3+/5      Palpation   Palpation comment  pt will not allow palpation      Ambulation/Gait   Gait Comments  Rt knee on rollator seat, SPC at home- foot near flat                   [facility] Adult PT Treatment/Exercise - 07/01/18 0001      Electrical Stimulation   Electrical Stimulation Location  Rt foot    Engineer, manufacturing  IFC    Electrical Stimulation Parameters  10 to tolerance    Electrical Stimulation Goals  Pain      Iontophoresis   Type of Iontophoresis  Dexamethasone    Location  plantar aspect of Rt foot    Dose  1cc    Time  6 hr wear      Manual Therapy   Manual therapy comments  pt applied- scar mob      Ankle Exercises: Supine   T-Band  4-way with tband   red     Ankle Exercises: Seated   Heel Raises Limitations  weight in ball of foot      Additional Ankle Exercises DO NOT USE   Towel Crunch Limitations  large range with toes               PT Short Term Goals - 07/01/18 1200      PT SHORT TERM GOAL #1   Title  pt will be able to demo HEP without requiring cues for form    Baseline  minimal compliance    Status  On-going      PT SHORT TERM GOAL #2   Title  pt will be able to lay the plantar surface of her foot on the floor without weight bearing    Baseline  able    Status  Achieved        PT Long Term Goals - 07/01/18 1203      PT LONG TERM GOAL #1   Title  Pt will demo DF to at least 5 deg for  required ROM during ambulation    Baseline  2    Status  On-going    Target Date  08/14/18      PT LONG TERM GOAL #2   Title  Pt will be able to balance enough to feel safe taking a shower on an acceptable basis for her    Baseline  taking baths and holding on to walls    Status  On-going    Target Date  08/14/18      PT LONG TERM GOAL #3   Title  pt will be able to utilize both feet on the ground for ambulation with SPC to move around her home    Baseline  weight focused to heel using SPC at home, heavy pressure on SPC    Status  On-going    Target Date  08/14/18      PT LONG TERM GOAL #4   Title  pt will report pain levels <=4/10 with functional daily ambulation    Baseline  7/10 today    Status  On-going    Target Date  08/14/18            Plan - 07/01/18 1255    Clinical Impression Statement  Pt has made progress since beginning PT- demo improved ROM as well as WB tolerance. Unable to perform proper stance phase rolling over foot due to pain but places foot on floor rather than stepping on lateral heel. Denied increased pain with exercises today and requested estim & ionto. Advised she should wear tallsock for edema control and pt is going to go to [employer] to fit for a good pair of shoes. cont to complain of Lt ankle pain- noted inversion upon push off from Lt foot when kneeling on rollator. decreasing frequency to 1/week due to pt being stressed about moving at this time.     PT Frequency  1x /  week    PT Duration  6 weeks    PT Treatment/Interventions  ADLs/Self Care Home Management;Cryotherapy;Moist Heat;Therapeutic exercise;Therapeutic activities;Functional mobility training;Stair training;Gait training;Ultrasound;DME Instruction;Balance training;Neuromuscular re-education;Patient/family education;Manual techniques;Taping;Passive range of motion;Iontophoresis 4mg /ml Dexamethasone;Electrical Stimulation    PT Next Visit Plan  desensitize, gross strengthening.  weight  bearing    PT Home Exercise Plan  ankle AROM in all planes, try to put foot on floor. seated ball squeeze + LAQ,, sidelying hip abd, prone hip ext; roller to plantar aspect of foot, great toe extension stretch    Consulted and Agree with Plan of Care  Patient    Family Member Consulted  Ashante cone social worker       Patient will benefit from skilled therapeutic intervention in order to improve the following deficits and impairments:  Abnormal gait, Decreased endurance, Impaired sensation, Decreased activity tolerance, Decreased strength, Pain, Difficulty walking, Decreased mobility, Decreased balance, Decreased range of motion, Improper body mechanics, Impaired flexibility, Decreased coordination  Visit Diagnosis: Pain in right foot  Difficulty in walking, not elsewhere classified  Muscle weakness (generalized)  History of falling  Tendinitis     Problem List Patient Active Problem List   Diagnosis Date Noted  . ADHD (attention deficit hyperactivity disorder) 06/11/2013  . PTSD (post-traumatic stress disorder) 06/11/2013  . History of seizures 06/11/2013  . TBI (traumatic brain injury) (HCC) 06/11/2013    Janayah Zavada C. Hutson Luft PT, DPT 07/01/18 1:01 PM   [facility] [address] [place], [place], [address] Phone: [phone]   Fax:  [phone]  Name: Joy Barber MRN: [id number] Date of Birth: 1960-10-14

## 2018-07-06 ENCOUNTER — Telehealth: Payer: Self-pay

## 2018-07-06 ENCOUNTER — Telehealth: Payer: Self-pay | Admitting: Family Medicine

## 2018-07-06 MED ORDER — GABAPENTIN 300 MG PO CAPS: 300.0000 mg | ORAL_CAPSULE | Freq: Three times a day (TID) | ORAL | 0 refills | Status: DC

## 2018-07-06 MED FILL — GABAPENTIN 300 MG CAPSULE: 300 | 30 days supply | Qty: 90 | Fill #0

## 2018-07-06 NOTE — Telephone Encounter (Signed)
Called patient to inform her of provider's decision, agreed that she would come in within a month of the medication being refilled and scheduled an appointment for 10/30. Patient verified she uses [facility] pharmacy. Patient was very grateful for providers understanding and help.

## 2018-07-06 NOTE — Telephone Encounter (Signed)
Please request that patient come in to be seen. I haven't seen patient in several months and she will need to establish here at [facility]. I will refill her medication for 30 days, however she will need to be seen within this time frame for medication to be refilled again. Also I can't refill any other chronic meds until she is seen. I think we can work around her co-pay and bill her if payment is an acute concern.

## 2018-07-06 NOTE — Telephone Encounter (Addendum)
Call received from patient. She confirmed that she scheduled an appointment with Lavell Anchors, FNP for 07/22/18.  She said that she may be asking [name] or [name] to accompany her to this first appointment even though knows Ms [name].  She explained that she is looking for housing in [place] through an income based housing program called [employer] ([employer]) She explained that she does not make enough money to afford housing in [place].  She was informed that she would be paying about $175-200/month for housing through this program .   She spoke of her frustrations with [facility] and she said that she was informed that she is not eligible for the [employer] [place] program because she doesn't have medicaid.   She inquired about meeting with [name], [employer] [place].  This CM explained that [name] would be happy to meet with her to screen her for [facility] eligibility.  [name] said that she is not eligible for medicaid and has been told that she is $100 over income.  However, she said that she is not sure that she believes that and may decide to meet with [name].  She said that she would let this CM know.  Regarding medicare advantage plans.  This CM informed her that as per Ms [name], the patient should be referred to [facility] since they can help with Medicare enrollment including Advantage plans. There are representatives at [address], [place] [address]. They are there from 10-2 and an appointment can be scheduled by calling [phone].  The patient said that she has tried that number but will continue to try. Also provided her with the phone # for [facility] to enroll over the phone # [phone].  The patient then inquired about her refill of gabapentin.  She said she has been trying to reach the pharmacy to confirm it is ready.  This CM informed her that it is ready for pick up and the patient said that she would come to the clinic today to get it.

## 2018-07-06 NOTE — Telephone Encounter (Signed)
Patient called stating that she needs more gabapentin (NEURONTIN) 300 MG capsule [[id number]]  Patient is aware that she needs to make an appointment to see the provider but states that she is switching coverage from medicaid where her copay is $3 to medicare where her copay would be $30. Patient states she has a meeting to discuss this because she has to move soon and doesn't have income and would like to have medication for when she goes to physical therapy. Please follow up.

## 2018-07-07 ENCOUNTER — Ambulatory Visit (INDEPENDENT_AMBULATORY_CARE_PROVIDER_SITE_OTHER): Payer: Medicare Other | Admitting: Sports Medicine

## 2018-07-07 ENCOUNTER — Telehealth: Payer: Self-pay

## 2018-07-07 ENCOUNTER — Encounter: Payer: Self-pay | Admitting: Sports Medicine

## 2018-07-07 DIAGNOSIS — L905 Scar conditions and fibrosis of skin: Secondary | ICD-10-CM

## 2018-07-07 DIAGNOSIS — M722 Plantar fascial fibromatosis: Secondary | ICD-10-CM | POA: Diagnosis not present

## 2018-07-07 DIAGNOSIS — M779 Enthesopathy, unspecified: Secondary | ICD-10-CM | POA: Diagnosis not present

## 2018-07-07 DIAGNOSIS — R52 Pain, unspecified: Secondary | ICD-10-CM | POA: Diagnosis not present

## 2018-07-07 DIAGNOSIS — S93401A Sprain of unspecified ligament of right ankle, initial encounter: Secondary | ICD-10-CM | POA: Diagnosis not present

## 2018-07-07 DIAGNOSIS — Z9889 Other specified postprocedural states: Secondary | ICD-10-CM | POA: Diagnosis not present

## 2018-07-07 DIAGNOSIS — M25471 Effusion, right ankle: Secondary | ICD-10-CM

## 2018-07-07 MED ORDER — TRAMADOL HCL 50 MG PO TABS: 50.0000 mg | ORAL_TABLET | Freq: Four times a day (QID) | ORAL | 0 refills | Status: DC | PRN

## 2018-07-07 MED FILL — traMADol HCL 50 MG TABS: 50 | 7 days supply | Qty: 28 | Fill #0

## 2018-07-07 MED FILL — NIFEDIPINE ER 30 MG TABLET: 30 | 30 days supply | Qty: 30 | Fill #1

## 2018-07-07 NOTE — Telephone Encounter (Signed)
Spoke to the patient when she came into the clinic today. She was picking up a prescription and was requesting assistance with finding a motel to stay at starting this weekend, She plans to only stay about a month until she is able to secure a permanent residence.  She said that she is not comfortable staying at the [facility] but will consider the Extended Stay.  She is planning to meet with  Dorise Hiss, [facility] scheduler,  tomorrow to review 3 options for motels.  She also requested that someone from [facility] accompany her to an appointment that she plans to schedule at [facility].  Explained to her that the housing issues will be addressed tomorrow and the plans for an appointment at [facility] will be discussed at a later time.

## 2018-07-07 NOTE — Patient Instructions (Signed)
*   after using epsom salt soaks or heat 10-15 min, ice your foot for about 5 min.  * In 2 weeks look for shoes *Unna boot Rt for 7 days, then remove pain patch( NSAID)  *use big boot for 2 weeks

## 2018-07-07 NOTE — Progress Notes (Signed)
Subjective:  Joy Barber is a 57 y.o. female patient who returns to office for follow up evaluation of right and left foot pain. Reports that she is still concerned about her pain management and does not want to go to pain management because she is afraid that they may make her out of a zombie and she does not want this.  Patient states that she is working with physical therapy and sometimes after physical therapy has increased episode of pain but does feel like therapy is helping states that now she can walk without the boot and put pressure down on the foot as long as she has the brace on the right and states that she still gets pain along the Achilles tendon but this is a little bit better with her heel cushion and states that she is still having some difficulty with her left knee and still having to use her brace and resting her leg on her knee scooter to help push around to decrease pressure on her feet.  Patient is having ongoing issues with safe housing and states that she will be looking for a place and may have to go to a hotel which is less expensive however there is risk associated with this hotel because she has been there before and sometimes she reports that she has to stay on the look out because of how the people are at the hotel versus trying to qualify for some additional placement because there is renovations at the current place where she is at.  Patient denies any other symptoms at this time.   Patient Active Problem List   Diagnosis Date Noted  . ADHD (attention deficit hyperactivity disorder) 06/11/2013  . PTSD (post-traumatic stress disorder) 06/11/2013  . History of seizures 06/11/2013  . TBI (traumatic brain injury) ([place]) 06/11/2013    Current Outpatient Medications on File Prior to Visit  Medication Sig Dispense Refill  . acetaminophen-codeine (TYLENOL #3) 300-30 MG tablet Take 1 tablet by mouth every 6 (six) hours as needed for moderate pain. Alternate with Tramadol as  needed. DO NOT FILL BEFORE 3/28 30 tablet 0  . Ascorbic Acid (VITAMIN C PO) Take 1 tablet by mouth daily.    Marland Kitchen azithromycin (ZITHROMAX) 250 MG tablet Take 2 tabs PO x 1 dose, then 1 tab PO QD x 4 days 6 tablet 0  . busPIRone (BUSPAR) 15 MG tablet TAKE 2 TABLETS BY MOUTH TWICE DAILY 120 tablet 2  . busPIRone (BUSPAR) 7.5 MG tablet Take 4 tablets (30 mg total) by mouth 2 (two) times daily. 240 tablet 3  . diclofenac (FLECTOR) 1.3 % PTCH Place 1 patch onto the skin 2 (two) times daily. 30 patch 0  . diphenhydramine-acetaminophen (TYLENOL PM) 25-500 MG TABS tablet Take 1 tablet by mouth every morning.    . gabapentin (NEURONTIN) 300 MG capsule Take 1 capsule (300 mg total) by mouth 3 (three) times daily. 90 capsule 0  . gentamicin cream (GARAMYCIN) 0.1 % Apply 1 application topically 3 (three) times daily. 30 g 1  . hydrochlorothiazide (HYDRODIURIL) 25 MG tablet Take 1 tablet (25 mg total) by mouth daily. (Patient taking differently: Take 25 mg by mouth daily as needed (for edema). ) 90 tablet 3  . hydrOXYzine (VISTARIL) 50 MG capsule TAKE 2 CAPSULES BY MOUTH 3 TIMES DAILY AS NEEDED FOR ANXIETY. 90 capsule 2  . NIFEdipine (PROCARDIA-XL/ADALAT-CC/NIFEDICAL-XL) 30 MG 24 hr tablet TAKE 1 TABLET BY MOUTH ONCE DAILY 90 tablet 0  . NON FORMULARY Piracet  800 mg tablets (memory aid): Take 1 tablet by mouth once a day or as otherwise instructed    . Omega-3 Fatty Acids (FISH OIL PO) Take 1 capsule by mouth daily.    . salicylic acid 17 % gel Apply topically daily. 15 g 0  . sodium chloride (OCEAN) 0.65 % SOLN nasal spray Place 1-2 sprays into both nostrils 3 (three) times daily as needed for congestion.    . traMADol (ULTRAM) 50 MG tablet Take 1 tablet (50 mg total) by mouth every 6 (six) hours as needed. 28 tablet 0   Current Facility-Administered Medications on File Prior to Visit  Medication Dose Route Frequency Provider Last Rate Last Dose  . betamethasone acetate-betamethasone sodium phosphate  (CELESTONE) injection 3 mg  3 mg Intramuscular Once Edrick Kins, DPM        Allergies  Allergen Reactions  . Cephalosporins Itching  . Nsaids Other (See Comments)    History of subdural hematoma  . Penicillins Itching and Other (See Comments)    Welts (also) Has patient had a PCN reaction causing immediate rash, facial/tongue/throat swelling, SOB or lightheadedness with hypotension: Yes Has patient had a PCN reaction causing severe rash involving mucus membranes or skin necrosis: No Has patient had a PCN reaction that required hospitalization: Yes Has patient had a PCN reaction occurring within the last 10 years: No If all of the above answers are "NO", then may proceed with Cephalosporin use.     Objective:  General: Alert and oriented x3 in no acute distress  Dermatology: + thick scar/keloid formation at plantar right foot that is hyper sensitive to touch as previous however it does appear to be improving from prior  Vascular: Dorsalis Pedis and Posterior Tibial pedal pulses palpable, Capillary Fill Time 3 seconds,(+) pedal hair growth bilateral, minimal focal edema right foot and ankle however there is varicosities at medial ankle and patient states that there is some swelling on the right and request a Unna boot, Temperature gradient within normal limits.  Neurology: Gross sensation intact via light touch bilateral. (- )Tinels sign bilateral. Subjective burning pain on right.     Musculoskeletal: Mild tenderness diffusely across right foot with most pain per patient at previous surgical site/fibroma excision site that has a keloid to area as above that appears to be slowly improving, there is also pain at the posterior left heel along the watershed of the Achilles with mild nodularity however the Achilles feels intact and there is negative [place] Silos sign this is likely thickening secondary to inflammation which could be secondary to compensation for her utilizing pushing with her  left foot when she uses her rolling knee scooter that has a lot of heavy objects and belongings on it as previous consistent with Achilles tendinitis, no pain with calf compression bilateral.  Range of motion within normal limits with mild guarding on right foot and pain to left heel.  Gait: Antalgic gait rolling walker assisted.   Assessment and Plan: Problem List Items Addressed This Visit    None    Visit Diagnoses    Painful scar    -  Primary   Plantar fascial fibromatosis       Post-operative state       Tendonitis       Sprain of right ankle, unspecified ligament, initial encounter       Ankle swelling, right          -Complete examination performed -Discussed treatement options for control of pain at scar  and tendinitis on left and chronic pain -Applied Unna boot to keep intact for 1 week on right foot and ankle and advised patient to continue with cam boot on the right for at least 2 weeks before she looks to go get a tennis shoe when she removes the [employer] may reapply her pain patch to her right arch -Continue with Flector patch on the left heel secured with paper tape and using Achilles pad with heel lift and left shoe new heel lifts placed in shoe at this visit -Continue PT  -Recommend Behavioral health for further eval/treament with peer support as previous and to consider pain management and advised patient that this prescription is her last prescription and she cannot get a refill and her tramadol medication will stop being prescribed by my office until the month of November once November calms will not provide any more pain medicine to help her over the next few weeks as she gets ready to finish up therapy -Patient to return to office PRN or sooner if condition worsens.  Landis Martins, DPM

## 2018-07-08 ENCOUNTER — Telehealth: Payer: Self-pay

## 2018-07-08 NOTE — Telephone Encounter (Signed)
The patient met with Joy Barber, [place] Office Team Lead to discuss options for temporary housing.  They contacted multiple motels. The patient stated that she does not want to continue to stay at her current residence and prefers to stay at a motel for a few weeks to get organized and gather her thoughts. She left with the plan to go to Extended Stay  - [place] to gather more information about their motel. She would like to move into the this weekend or Monday, 07/13/18.   She wants a first floor room but also wants a smoking room.  This CM cautioned her about smoking in a non - smoking room and the charge that she may occur from smoking.   She also confirmed that she will meet with [name] , [employer] [place] to determine if she is dually eligible for medicare/mediaid. She scheduled at appointment with [facility] for 07/15/18 @ 1100.  The patient was also reminded that she needs to speak to [facility] counselor to discuss medicare advantage plan options if she is still interested. She acknowledged that she has the phone # for [employer].

## 2018-07-10 ENCOUNTER — Ambulatory Visit: Payer: Medicare Other | Admitting: Licensed Clinical Social Worker

## 2018-07-15 ENCOUNTER — Ambulatory Visit: Payer: Medicare Other | Admitting: Licensed Clinical Social Worker

## 2018-07-15 ENCOUNTER — Ambulatory Visit: Payer: Medicare Other | Admitting: Physical Therapy

## 2018-07-16 ENCOUNTER — Telehealth: Payer: Self-pay | Admitting: Licensed Clinical Social Worker

## 2018-07-16 ENCOUNTER — Telehealth: Payer: Self-pay | Admitting: Neurology

## 2018-07-16 NOTE — Telephone Encounter (Signed)
MSW Intern contacted pt to confirm her appt at 2:00 PM. Pt shared that she was not feeling well and did not plan on coming in. Pt shared that she is currently staying in a hotel and she would like to schedule an appt to discuss permanent housing.  [name]' Leilani Merl, MSW Intern

## 2018-07-16 NOTE — Telephone Encounter (Signed)
MSW Intern contacted pt to schedule appt for next week. Appt is scheduled Wed 07/22/18 @ 3:30 PM. Pt also has appt scheduled at [facility] that morning.    [name]' Leilani Merl, MSW Intern

## 2018-07-16 NOTE — Telephone Encounter (Signed)
Dr Bailar is leaving our office and we will not be able to see patient we have mailed a letter to patient to inform them of the cancellation. Thank you for your understanding  °

## 2018-07-21 ENCOUNTER — Telehealth: Payer: Self-pay | Admitting: Physical Therapy

## 2018-07-21 ENCOUNTER — Other Ambulatory Visit: Payer: Self-pay | Admitting: Sports Medicine

## 2018-07-21 DIAGNOSIS — Z9889 Other specified postprocedural states: Secondary | ICD-10-CM

## 2018-07-21 DIAGNOSIS — M722 Plantar fascial fibromatosis: Secondary | ICD-10-CM

## 2018-07-21 DIAGNOSIS — M779 Enthesopathy, unspecified: Secondary | ICD-10-CM

## 2018-07-21 DIAGNOSIS — M25471 Effusion, right ankle: Secondary | ICD-10-CM

## 2018-07-21 DIAGNOSIS — M6281 Muscle weakness (generalized): Secondary | ICD-10-CM

## 2018-07-21 DIAGNOSIS — Z87820 Personal history of traumatic brain injury: Secondary | ICD-10-CM

## 2018-07-21 DIAGNOSIS — S93401A Sprain of unspecified ligament of right ankle, initial encounter: Secondary | ICD-10-CM

## 2018-07-21 DIAGNOSIS — L905 Scar conditions and fibrosis of skin: Secondary | ICD-10-CM

## 2018-07-21 DIAGNOSIS — R52 Pain, unspecified: Principal | ICD-10-CM

## 2018-07-21 MED ORDER — TRAMADOL HCL 50 MG PO TABS: 50.0000 mg | ORAL_TABLET | Freq: Four times a day (QID) | ORAL | 0 refills | Status: DC | PRN

## 2018-07-21 MED FILL — traMADol HCL 50 MG TABS: 50 | 7 days supply | Qty: 28 | Fill #0

## 2018-07-21 NOTE — Progress Notes (Signed)
Patient came by office stating that she was bit by a spider on calf and is having pain but states that she took all of her tramadol due to the pain in her calf. States that she is all out of Tramadol and is requesting more now because her heel on left and her scar tissue on her right is hurting. I refilled tramadol this one time and gave heel pads for left and recommend rest, ice, elevation to help with the spider bite or go to Urgent Care -Dr. Cannon Kettle

## 2018-07-21 NOTE — Telephone Encounter (Signed)
Returned pt message regarding shoe store- recommended [employer] for shoe fitting. Pt reports she did not attend last PT session due to spider bite that caused her calf to swell and felt SOB. Is going to see Dr Cannon Kettle for this.  Shanele Nissan C. Nanna Ertle PT, DPT 07/21/18 2:53 PM

## 2018-07-22 ENCOUNTER — Ambulatory Visit: Payer: Medicare Other | Admitting: Family Medicine

## 2018-07-22 ENCOUNTER — Ambulatory Visit: Payer: Medicare Other | Admitting: Licensed Clinical Social Worker

## 2018-07-22 ENCOUNTER — Ambulatory Visit: Payer: Medicare Other | Admitting: Physical Therapy

## 2018-07-28 ENCOUNTER — Ambulatory Visit: Payer: Medicare Other | Attending: Family Medicine | Admitting: Licensed Clinical Social Worker

## 2018-07-28 ENCOUNTER — Ambulatory Visit: Payer: Medicare Other | Admitting: Sports Medicine

## 2018-07-28 DIAGNOSIS — F419 Anxiety disorder, unspecified: Secondary | ICD-10-CM

## 2018-07-29 ENCOUNTER — Ambulatory Visit: Payer: Medicare Other | Attending: Sports Medicine | Admitting: Physical Therapy

## 2018-07-29 ENCOUNTER — Telehealth: Payer: Self-pay | Admitting: Licensed Clinical Social Worker

## 2018-07-29 ENCOUNTER — Telehealth (HOSPITAL_COMMUNITY): Payer: Self-pay | Admitting: Obstetrics and Gynecology

## 2018-07-29 ENCOUNTER — Ambulatory Visit: Payer: Medicare Other | Admitting: Family Medicine

## 2018-07-29 ENCOUNTER — Encounter: Payer: Self-pay | Admitting: Physical Therapy

## 2018-07-29 DIAGNOSIS — R262 Difficulty in walking, not elsewhere classified: Secondary | ICD-10-CM | POA: Diagnosis not present

## 2018-07-29 DIAGNOSIS — M779 Enthesopathy, unspecified: Secondary | ICD-10-CM | POA: Insufficient documentation

## 2018-07-29 DIAGNOSIS — M79671 Pain in right foot: Secondary | ICD-10-CM | POA: Diagnosis not present

## 2018-07-29 DIAGNOSIS — M6281 Muscle weakness (generalized): Secondary | ICD-10-CM

## 2018-07-29 DIAGNOSIS — Z9181 History of falling: Secondary | ICD-10-CM | POA: Diagnosis not present

## 2018-07-29 NOTE — Therapy (Signed)
06/11/2013  . PTSD (post-traumatic stress disorder) 06/11/2013  . History of seizures 06/11/2013  . TBI (traumatic brain injury) ([place]) 06/11/2013    Zanylah Hardie C. Aneita Kiger PT, DPT 07/29/18 3:19 PM   [facility] [address] [place], [place], [address] Phone: [phone]   Fax:  [phone]  Name: ARYANNA SHAVER MRN: [id number] Date of Birth: 08-27-1961  06/11/2013  . PTSD (post-traumatic stress disorder) 06/11/2013  . History of seizures 06/11/2013  . TBI (traumatic brain injury) ([place]) 06/11/2013    Zanylah Hardie C. Aneita Kiger PT, DPT 07/29/18 3:19 PM   [facility] [address] [place], [place], [address] Phone: [phone]   Fax:  [phone]  Name: ARYANNA SHAVER MRN: [id number] Date of Birth: 08-27-1961  [place], [place], [address] Phone: [phone]   Fax:  [phone]  Physical Therapy Treatment  Patient Details  Name: LANISA ISHLER MRN: [id number] Date of Birth: 1960-10-25 Referring Provider (PT): Landis Martins, [place]   Encounter Date: 07/29/2018  PT End of Session - 07/29/18 1501    Visit Number  5    Number of Visits  10    Date for PT Re-Evaluation  08/14/18    Authorization Type  MCR    PT Start Time  5956    PT Stop Time  1504    PT Time Calculation (min)  44 min    Activity Tolerance  Patient tolerated treatment well    Behavior During Therapy  Anxious       Past Medical History:  Diagnosis Date  . ADHD (attention deficit hyperactivity disorder)   . Allergy   . Anxiety   . Complication of anesthesia    "to ether anesthesia when I was 57 years old, woke up with N/V"  . GERD (gastroesophageal reflux disease)   . Head injury    post-concusive syndrome  . Headache   . Hypertension   . OCD (obsessive compulsive disorder)   . PTSD (post-traumatic stress disorder)   . Seizures ([place])    s/p TBI  . Subdural hematoma ([place]) sept 2012    Past Surgical History:  Procedure Laterality Date  . ABDOMINAL HYSTERECTOMY    . COSMETIC SURGERY  1975   forehead  . PLANTAR FASCIA RELEASE Right 05/22/2017   Procedure: excision PLANTAR fibromas;  Surgeon: Edrick Kins, DPM;  Location: [facility];  Service: Podiatry;  Laterality: Right;    There were no vitals filed for this visit.  Subjective Assessment - 07/29/18 1424    Subjective  Exercises are going okay but not as well as I would like them to.     Patient Stated Goals  be able to walk without AD, decrease pain, improve balance- shower, bike    Currently in Pain?  Yes    Pain Score  6     Pain Location  Foot    Pain Orientation  Right    Pain Descriptors / Indicators  Burning    Aggravating Factors   weight bearing    Pain Relieving Factors   rest                       [facility] Adult PT Treatment/Exercise - 07/29/18 0001      Iontophoresis   Type of Iontophoresis  Dexamethasone    Location  plantar aspect of Rt foot    Dose  1cc    Time  6 hr wear      Manual Therapy   Manual Therapy  Joint mobilization;Soft tissue mobilization    Joint Mobilization  great toe distraction &ROM    Soft tissue mobilization  skin around incision & stretching plantar fascia      Ankle Exercises: Seated   Heel Slides Limitations  towel slide on floor    Other Seated Ankle Exercises  PF black tband      Ankle Exercises: Standing   Other Standing Ankle Exercises  weight shift on airex with walker for support- parallel and wide tandem    Other Standing Ankle Exercises  standing on ariex- roll onto ball of foot               PT Short Term Goals - 07/01/18

## 2018-07-29 NOTE — Telephone Encounter (Signed)
Called patient to re-schedule mammogram.  She now has Medicare so [facility].

## 2018-07-29 NOTE — Telephone Encounter (Signed)
MSW Intern met pt at physical therapy appt. Pt shared that she desires appt to discuss medicare plans.   Dennison Mascot, MSW Intern 07/29/18, 4:54 PM

## 2018-07-30 ENCOUNTER — Other Ambulatory Visit: Payer: Self-pay | Admitting: Sports Medicine

## 2018-07-30 ENCOUNTER — Telehealth: Payer: Self-pay | Admitting: Sports Medicine

## 2018-07-30 DIAGNOSIS — R52 Pain, unspecified: Principal | ICD-10-CM

## 2018-07-30 DIAGNOSIS — L905 Scar conditions and fibrosis of skin: Secondary | ICD-10-CM

## 2018-07-30 MED ORDER — DICLOFENAC EPOLAMINE 1.3 % TD PTCH: 1.0000 | MEDICATED_PATCH | Freq: Two times a day (BID) | TRANSDERMAL | 0 refills | Status: DC

## 2018-07-30 NOTE — Progress Notes (Signed)
Reordered Flector patches -Dr. Cannon Kettle

## 2018-07-30 NOTE — Telephone Encounter (Signed)
I will send a refill of the pain patches to her pharmacy -Dr. Chauncey Cruel

## 2018-07-30 NOTE — Telephone Encounter (Signed)
Patient wanted to make you aware that, shes out of the patches. She has a few more and will need more. She said she will just pick them up at her appointment next week.

## 2018-07-31 NOTE — BH Specialist Note (Signed)
LCSWA and RN CM met with pt.  Pt shared frustrations with difficulty obtaining supportive resources. Pt and therapist, Mr. Derek Mound, met with [place] Specialist, Bronwen Betters, CPSS to address alternative housing resources for individuals with MI. Pt allowed LCSWA to listen to recording of meeting, where Ms. Pricilla Holm, only offered long-term housing as an option.   LCSWA and RN CM assisted pt in creating a plan for housing. Pt agreed to implement meditation on a daily basis for approx 30 minutes to assist with decrease in anxiety and irritability. LCSWA strongly encouraged pt to address with therapist the importance of processing recent traumatic events to decrease symptoms.    Pt was provided information on how to select medicare supplement plan. Pt shared that she prefers to contact each plan separately instead of utilizing [place] representative at a local [employer]

## 2018-08-04 ENCOUNTER — Telehealth: Payer: Self-pay

## 2018-08-04 ENCOUNTER — Ambulatory Visit: Payer: Medicare Other | Admitting: Sports Medicine

## 2018-08-04 ENCOUNTER — Other Ambulatory Visit: Payer: Self-pay

## 2018-08-04 NOTE — Telephone Encounter (Signed)
I left a message requesting a call back from a nurse at [facility] to explain that the Tramadol Rx from 07/07/18 was mistakenly filled under Dr. Ander Gaster Fulp's name. Ms. Selvage is afraid that this pharmacy error will cause issues with her being able to receive controlled prescriptions. In this situation she hasn't done anything wrong, there was a pharmacy error and we are trying to make sure that the provider who writes her RX is fully aware.

## 2018-08-04 NOTE — Telephone Encounter (Signed)
Call received from patient stating  she noticed that the prescription of tramadol that she picked up on 07/07/18 at [facility] has Dr Siri Cole name on it as the prescribing physician. She explained that the prescription was written by Dr Cannon Kettle and she is inquiring why the discrepancy. She said that she is " locked in " with medicaid to having Dr Cannon Kettle prescribe controlled substances. The call was dropped after multiple interruptions in the call.  This CM explained the concern to Veneda Melter, [facility] who stated that the [facility] pharmacy will research the issue and a pharmacy representative will get back to the patient.   Attempted to contact the patient to inform her of above noted conversation with the pharmacist. Voicemail message left requesting a call back to this CM # [phone].

## 2018-08-05 ENCOUNTER — Telehealth: Payer: Self-pay

## 2018-08-05 ENCOUNTER — Ambulatory Visit: Payer: Medicare Other | Admitting: Physical Therapy

## 2018-08-05 ENCOUNTER — Telehealth: Payer: Self-pay | Admitting: Sports Medicine

## 2018-08-05 MED FILL — NIFEDIPINE ER 30 MG TABLET: 30 | 30 days supply | Qty: 30 | Fill #2

## 2018-08-05 MED FILL — FLECTOR 1.3% PATCH: 1.3 | 15 days supply | Qty: 30 | Fill #0

## 2018-08-05 NOTE — Telephone Encounter (Signed)
Pharmacy requesting prior authorization for topical patch prescribed.

## 2018-08-05 NOTE — Telephone Encounter (Signed)
I was notified by the pharmacy that the patient still needed a prior authorization on her Nifedipine and Flector patches, I called [employer] and both were approved thru 09/22/18. As of right this moment we have not been able to process a paid claim for them in the pharmacy but that is not unusual, we will try in 56mins-1hr and call the insurance company back if we continue to have issues.

## 2018-08-05 NOTE — Telephone Encounter (Signed)
Faxed copy of the PA for Flector patch to [facility].

## 2018-08-11 ENCOUNTER — Encounter: Payer: Self-pay | Admitting: Sports Medicine

## 2018-08-11 ENCOUNTER — Ambulatory Visit: Payer: Medicare Other | Attending: Family Medicine | Admitting: Licensed Clinical Social Worker

## 2018-08-11 ENCOUNTER — Ambulatory Visit (INDEPENDENT_AMBULATORY_CARE_PROVIDER_SITE_OTHER): Payer: Medicare Other | Admitting: Sports Medicine

## 2018-08-11 DIAGNOSIS — L905 Scar conditions and fibrosis of skin: Secondary | ICD-10-CM

## 2018-08-11 DIAGNOSIS — Z9889 Other specified postprocedural states: Secondary | ICD-10-CM

## 2018-08-11 DIAGNOSIS — R52 Pain, unspecified: Secondary | ICD-10-CM | POA: Diagnosis not present

## 2018-08-11 DIAGNOSIS — M722 Plantar fascial fibromatosis: Secondary | ICD-10-CM | POA: Diagnosis not present

## 2018-08-11 DIAGNOSIS — S93401D Sprain of unspecified ligament of right ankle, subsequent encounter: Secondary | ICD-10-CM

## 2018-08-11 DIAGNOSIS — M6281 Muscle weakness (generalized): Secondary | ICD-10-CM

## 2018-08-11 DIAGNOSIS — M779 Enthesopathy, unspecified: Secondary | ICD-10-CM

## 2018-08-11 DIAGNOSIS — Z87820 Personal history of traumatic brain injury: Secondary | ICD-10-CM

## 2018-08-11 DIAGNOSIS — F439 Reaction to severe stress, unspecified: Secondary | ICD-10-CM

## 2018-08-11 DIAGNOSIS — M25471 Effusion, right ankle: Secondary | ICD-10-CM

## 2018-08-11 MED ORDER — TRAMADOL HCL 50 MG PO TABS: 50.0000 mg | ORAL_TABLET | Freq: Four times a day (QID) | ORAL | 0 refills | Status: DC | PRN

## 2018-08-11 MED ORDER — DICLOFENAC EPOLAMINE 1.3 % TD PTCH: 1.0000 | MEDICATED_PATCH | Freq: Two times a day (BID) | TRANSDERMAL | 0 refills | Status: DC

## 2018-08-11 MED FILL — traMADol HCL 50 MG TABS: 50 | 7 days supply | Qty: 28 | Fill #0

## 2018-08-11 NOTE — Progress Notes (Signed)
Subjective:  Joy Barber is a 57 y.o. female patient who returns to office for follow up evaluation of right and left foot pain. Reports that she is still concerned about her pain meds because on 10/15 that bottle had a different provider name on it.  Patient states that she is working with physical therapy and sometimes after physical therapy has increased episode of pain that she needs Tramadol for because they have her going up on toes which hurts at right 1st toe. Patient also reports that she is running out of patches and has to go by pharmacy for more but since she has been without them notices that right arch knots are more inflamed. Patient reports that she is to get new shoes and orthotics and is going to go to [facility] for this. Patient also reports a previous insect bite on the right with swelling to right that is much better now and treated it with ice, ace, elevation.  Patient denies any other symptoms at this time.   Patient Active Problem List   Diagnosis Date Noted  . ADHD (attention deficit hyperactivity disorder) 06/11/2013  . PTSD (post-traumatic stress disorder) 06/11/2013  . History of seizures 06/11/2013  . TBI (traumatic brain injury) ([place]) 06/11/2013    Current Outpatient Medications on File Prior to Visit  Medication Sig Dispense Refill  . acetaminophen-codeine (TYLENOL #3) 300-30 MG tablet Take 1 tablet by mouth every 6 (six) hours as needed for moderate pain. Alternate with Tramadol as needed. DO NOT FILL BEFORE 3/28 30 tablet 0  . Ascorbic Acid (VITAMIN C PO) Take 1 tablet by mouth daily.    . busPIRone (BUSPAR) 15 MG tablet TAKE 2 TABLETS BY MOUTH TWICE DAILY 120 tablet 2  . diphenhydramine-acetaminophen (TYLENOL PM) 25-500 MG TABS tablet Take 1 tablet by mouth every morning.    . gabapentin (NEURONTIN) 300 MG capsule Take 1 capsule (300 mg total) by mouth 3 (three) times daily. 90 capsule 0  . gentamicin cream (GARAMYCIN) 0.1 % Apply 1 application topically 3  (three) times daily. 30 g 1  . hydrochlorothiazide (HYDRODIURIL) 25 MG tablet Take 1 tablet (25 mg total) by mouth daily. (Patient taking differently: Take 25 mg by mouth daily as needed (for edema). ) 90 tablet 3  . hydrOXYzine (VISTARIL) 50 MG capsule TAKE 2 CAPSULES BY MOUTH 3 TIMES DAILY AS NEEDED FOR ANXIETY. 90 capsule 2  . NIFEdipine (PROCARDIA-XL/ADALAT-CC/NIFEDICAL-XL) 30 MG 24 hr tablet TAKE 1 TABLET BY MOUTH ONCE DAILY 90 tablet 0  . NON FORMULARY Piracet 800 mg tablets (memory aid): Take 1 tablet by mouth once a day or as otherwise instructed    . Omega-3 Fatty Acids (FISH OIL PO) Take 1 capsule by mouth daily.    . sodium chloride (OCEAN) 0.65 % SOLN nasal spray Place 1-2 sprays into both nostrils 3 (three) times daily as needed for congestion.     Current Facility-Administered Medications on File Prior to Visit  Medication Dose Route Frequency Provider Last Rate Last Dose  . betamethasone acetate-betamethasone sodium phosphate (CELESTONE) injection 3 mg  3 mg Intramuscular Once Edrick Kins, DPM        Allergies  Allergen Reactions  . Cephalosporins Itching  . Nsaids Other (See Comments)    History of subdural hematoma  . Penicillins Itching and Other (See Comments)    Welts (also) Has patient had a PCN reaction causing immediate rash, facial/tongue/throat swelling, SOB or lightheadedness with hypotension: Yes Has patient had a PCN reaction causing  severe rash involving mucus membranes or skin necrosis: No Has patient had a PCN reaction that required hospitalization: Yes Has patient had a PCN reaction occurring within the last 10 years: No If all of the above answers are "NO", then may proceed with Cephalosporin use.     Objective:  General: Alert and oriented x3 in no acute distress  Dermatology: + thick scar/keloid formation at plantar right foot that is hyper sensitive to touch as previous.   Vascular: Dorsalis Pedis and Posterior Tibial pedal pulses intact,  Temperature gradient within normal limits.  Neurology: Gross sensation intact via light touch bilateral. (- )Tinels sign bilateral. Subjective burning pain on right.     Musculoskeletal: + tenderness R foot at arch, Range of motion within normal limits with mild guarding on right foot and pain to left heel as previous with shoe in place.  Gait: Antalgic gait rolling walker assisted.   Assessment and Plan: Problem List Items Addressed This Visit    None    Visit Diagnoses    Painful scar    -  Primary   Relevant Medications   diclofenac (FLECTOR) 1.3 % PTCH   diclofenac (FLECTOR) 1.3 % [facility]   traMADol (ULTRAM) 50 MG tablet   Plantar fascial fibromatosis       Relevant Medications   traMADol (ULTRAM) 50 MG tablet   Post-operative state       Relevant Medications   traMADol (ULTRAM) 50 MG tablet   Tendonitis       Relevant Medications   traMADol (ULTRAM) 50 MG tablet   Sprain of right ankle, unspecified ligament, subsequent encounter       Relevant Medications   traMADol (ULTRAM) 50 MG tablet   Ankle swelling, right       Relevant Medications   traMADol (ULTRAM) 50 MG tablet   Calf muscle weakness       Relevant Medications   traMADol (ULTRAM) 50 MG tablet   History of traumatic brain injury       Relevant Medications   traMADol (ULTRAM) 50 MG tablet      -Complete examination performed -Discussed treatement options for control of pain at scar and tendinitis on left and chronic pain -Advised patient that I can NOT keep refilling her Tramadol medication but I did for 1 more time this visit only for her to have to take as needed especially after PT -Continue with Flector patchs bilateral -Continue PT  -Patient to get new shoes and insoles from [place] -Recommend Behavioral health for further eval/treament with peer support as previous and to consider pain management if she still requests Tramadol because I know longer can write this medication -Patient to return to office  PRN or sooner if condition worsens.  Landis Martins, DPM

## 2018-08-12 ENCOUNTER — Ambulatory Visit: Payer: Medicare Other | Admitting: Physical Therapy

## 2018-08-12 ENCOUNTER — Other Ambulatory Visit: Payer: Self-pay | Admitting: Family Medicine

## 2018-08-12 ENCOUNTER — Encounter: Payer: Self-pay | Admitting: Physical Therapy

## 2018-08-12 DIAGNOSIS — R262 Difficulty in walking, not elsewhere classified: Secondary | ICD-10-CM | POA: Diagnosis not present

## 2018-08-12 DIAGNOSIS — M79671 Pain in right foot: Secondary | ICD-10-CM | POA: Diagnosis not present

## 2018-08-12 DIAGNOSIS — M779 Enthesopathy, unspecified: Secondary | ICD-10-CM

## 2018-08-12 DIAGNOSIS — Z9181 History of falling: Secondary | ICD-10-CM | POA: Diagnosis not present

## 2018-08-12 DIAGNOSIS — M6281 Muscle weakness (generalized): Secondary | ICD-10-CM | POA: Diagnosis not present

## 2018-08-12 MED ORDER — BUSPIRONE HCL 15 MG PO TABS: 30.0000 mg | ORAL_TABLET | Freq: Two times a day (BID) | ORAL | 0 refills | Status: AC

## 2018-08-12 MED ORDER — GABAPENTIN 300 MG PO CAPS: 300.0000 mg | ORAL_CAPSULE | Freq: Three times a day (TID) | ORAL | 0 refills | Status: AC

## 2018-08-12 MED FILL — GABAPENTIN 300 MG CAPSULE: 300 | 30 days supply | Qty: 90 | Fill #0

## 2018-08-12 MED FILL — HYDROXYZINE PAM 50 MG CAP: 50 | 15 days supply | Qty: 90 | Fill #2

## 2018-08-12 MED FILL — busPIRone HCL 15 MG TABS: 15 | 30 days supply | Qty: 120 | Fill #0

## 2018-08-12 NOTE — Therapy (Signed)
[facility] [address] [place], [place], [address] Phone: [phone]   Fax:  [phone]  Physical Therapy Treatment/ERO Patient Details  Name: Joy Barber MRN: [id number] Date of Birth: 06/18/1961 Referring Provider (PT): Asencion Islam, [place]   Encounter Date: 08/12/2018  PT End of Session - 08/12/18 1416    Visit Number  6    Number of Visits  10    Date for PT Re-Evaluation  09/18/18    Authorization Type  MCR    PT Start Time  1415    PT Stop Time  1504    PT Time Calculation (min)  49 min    Activity Tolerance  Patient tolerated treatment well    Behavior During Therapy  Anxious;Agitated       Past Medical History:  Diagnosis Date  . ADHD (attention deficit hyperactivity disorder)   . Allergy   . Anxiety   . Complication of anesthesia    "to ether anesthesia when I was 57 years old, woke up with N/V"  . GERD (gastroesophageal reflux disease)   . Head injury    post-concusive syndrome  . Headache   . Hypertension   . OCD (obsessive compulsive disorder)   . PTSD (post-traumatic stress disorder)   . Seizures ([facility])    s/p TBI  . Subdural hematoma ([facility]) sept 2012    Past Surgical History:  Procedure Laterality Date  . ABDOMINAL HYSTERECTOMY    . COSMETIC SURGERY  1975   forehead  . PLANTAR FASCIA RELEASE Right 05/22/2017   Procedure: excision PLANTAR fibromas;  Surgeon: Felecia Shelling, DPM;  Location: [facility];  Service: Podiatry;  Laterality: Right;    There were no vitals filed for this visit.  Subjective Assessment - 08/12/18 1416    Subjective  Felt okay after last visit. walking on lateral aspect of heel without boot.     Patient Stated Goals  be able to walk without AD, decrease pain, improve balance- shower, bike    Currently in Pain?  Yes    Pain Score  5     Pain Location  Foot    Pain Orientation  Right    Pain Descriptors / Indicators  --   stinging   Aggravating Factors   placing weight  on foot    Pain Relieving Factors  boot         [facility] PT Assessment - 08/12/18 0001      Assessment   Medical Diagnosis  tendinitis    Referring Provider (PT)  Asencion Islam, DPM    Onset Date/Surgical Date  05/22/18      Sensation   Additional Comments  Numb in great toe, 1st MTP feels jammed      ROM / Strength   AROM / PROM / Strength  AROM      AROM   AROM Assessment Site  Ankle    Right/Left Ankle  Right    Right Ankle Dorsiflexion  5    Right Ankle Plantar Flexion  50    Right Ankle Inversion  30    Right Ankle Eversion  10      Strength   Overall Strength Comments  all 5/5 except DF- unabvle to assess in standing                   [facility] Adult PT Treatment/Exercise - 08/12/18 0001      Iontophoresis   Type of Iontophoresis  Dexamethasone    Location  plantar aspect of Rt foot    Dose  1cc    Time  6 hr wear      Ankle Exercises: Standing   Other Standing Ankle Exercises  weight shifting & toe off on airex in walker    Other Standing Ankle Exercises  gait on mat without AD             PT Education - 08/12/18 1510    Education Details  goals discussion, objective measures, ionto, POC, shoes & referral    Person(s) Educated  Patient    Methods  Explanation;Demonstration;Tactile cues;Verbal cues    Comprehension  Returned demonstration;Verbalized understanding;Verbal cues required;Tactile cues required;Need further instruction       PT Short Term Goals - 07/01/18 1200      PT SHORT TERM GOAL #1   Title  pt will be able to demo HEP without requiring cues for form    Baseline  minimal compliance    Status  On-going      PT SHORT TERM GOAL #2   Title  pt will be able to lay the plantar surface of her foot on the floor without weight bearing    Baseline  able    Status  Achieved        PT Long Term Goals - 08/12/18 1432      PT LONG TERM GOAL #1   Title  Pt will demo DF to at least 5 deg for required ROM during ambulation     Status  Achieved      PT LONG TERM GOAL #2   Title  Pt will be able to balance enough to feel safe taking a shower on an acceptable basis for her    Baseline  improved use of foot but not flat on floor, using shower chair sometimes and holding on to wall. now able to use shower    Status  On-going      PT LONG TERM GOAL #3   Title  pt will be able to utilize both feet on the ground for ambulation with SPC to move around her home    Baseline  got a walker, still using rollater for community ambulation, SPC at home    Status  On-going      PT LONG TERM GOAL #4   Title  pt will report pain levels <=4/10 with functional daily ambulation    Baseline  7/10 with ADLs    Status  On-going            Plan - 08/12/18 1506    Clinical Impression Statement  Placed folded mat out on floor and pt was able to walk with more normalized gait pattern without boot. Reported stinging but okay. Discussed obtaining shoes and explained referral to her. Demo good improvement in strength and ROM of ankle and tolerated palpation by PT to plantar aspect of foot. Pt will continue to benefit from PT to continue improving functional weight bearing tolerance to foot/ankle. Pt is agreeable to work with another PT for use of tai chi movements to approach therapy.     PT Treatment/Interventions  ADLs/Self Care Home Management;Cryotherapy;Moist Heat;Therapeutic exercise;Therapeutic activities;Functional mobility training;Stair training;Gait training;Ultrasound;DME Instruction;Balance training;Neuromuscular re-education;Patient/family education;Manual techniques;Taping;Passive range of motion;Iontophoresis 4mg /ml Dexamethasone;Electrical Stimulation    PT Next Visit Plan  tai chi movements for therapeutic treatment.     PT Home Exercise Plan  ankle AROM in all planes, try to put foot on floor. seated ball squeeze + LAQ,, sidelying hip abd,  prone hip ext; roller to plantar aspect of foot, great toe extension stretch; stand in  walker, black tband PF    Consulted and Agree with Plan of Care  Patient    Family Member Consulted  Ashante cone social worker       Patient will benefit from skilled therapeutic intervention in order to improve the following deficits and impairments:  Abnormal gait, Decreased endurance, Impaired sensation, Decreased activity tolerance, Decreased strength, Pain, Difficulty walking, Decreased mobility, Decreased balance, Decreased range of motion, Improper body mechanics, Impaired flexibility, Decreased coordination  Visit Diagnosis: Pain in right foot - Plan: PT plan of care cert/re-cert  Difficulty in walking, not elsewhere classified - Plan: PT plan of care cert/re-cert  Muscle weakness (generalized) - Plan: PT plan of care cert/re-cert  History of falling - Plan: PT plan of care cert/re-cert  Tendinitis - Plan: PT plan of care cert/re-cert     Problem List Patient Active Problem List   Diagnosis Date Noted  . ADHD (attention deficit hyperactivity disorder) 06/11/2013  . PTSD (post-traumatic stress disorder) 06/11/2013  . History of seizures 06/11/2013  . TBI (traumatic brain injury) ([facility]) 06/11/2013    Gresham Caetano C. Sharifah Champine PT, DPT 08/12/18 3:12 PM   [facility] [address] [place], [place], [address] Phone: [phone]   Fax:  [phone]  Name: TRENDA CHICA MRN: [id number] Date of Birth: Jan 06, 1961

## 2018-08-12 NOTE — BH Specialist Note (Signed)
LCSWA met with pt. Pt shared that her priorities at this time are to order her customized orthopedic shoes and choose an appropriate medicare supplement plan. Pt discussed with Pharmacy Tech, Feliz Beam, about various options.

## 2018-08-12 NOTE — Telephone Encounter (Signed)
Requested medication (s) are due for refill today: Yes  Requested medication (s) are on the active medication list: Yes  Last refill:  07/06/18  Future visit scheduled: No  Notes to clinic:  See request    Requested Prescriptions  Pending Prescriptions Disp Refills   gabapentin (NEURONTIN) 300 MG capsule [Pharmacy Med Name: GABAPENTIN 300 MG CAPSULE 300 CAP] 90 capsule 0    Sig: Take 1 capsule (300 mg total) by mouth 3 (three) times daily.     There is no refill protocol information for this order     busPIRone (BUSPAR) 15 MG tablet [Pharmacy Med Name: busPIRone HCL 15 MG TABS 15 TAB] 120 tablet 2    Sig: TAKE 2 TABLETS BY MOUTH TWICE DAILY     There is no refill protocol information for this order

## 2018-08-12 NOTE — Telephone Encounter (Signed)
No more refills until she schedules an appt.

## 2018-08-19 ENCOUNTER — Ambulatory Visit: Payer: Medicare Other | Admitting: Licensed Clinical Social Worker

## 2018-08-24 ENCOUNTER — Ambulatory Visit: Payer: Medicare Other | Admitting: Licensed Clinical Social Worker

## 2018-08-25 ENCOUNTER — Encounter: Payer: No Typology Code available for payment source | Admitting: Psychology

## 2018-08-26 ENCOUNTER — Ambulatory Visit: Payer: Medicare Other | Admitting: Licensed Clinical Social Worker

## 2018-08-26 ENCOUNTER — Ambulatory Visit: Payer: Medicare Other | Admitting: Physical Therapy

## 2018-08-26 ENCOUNTER — Telehealth: Payer: Self-pay | Admitting: Licensed Clinical Social Worker

## 2018-08-26 NOTE — Telephone Encounter (Signed)
Contacted pt to confirm that she can come in for her appt on Friday @ 1:30PM. Pt also reported that her current therapist told her that she needs to see an LCSW once/week. She stated that she will ask her therapist if he was asking her to see an LCSW for case management or therapy, and she will also get recommendations. Pt was tearful on the phone when discussing that she will be losing her current therapist.

## 2018-08-26 NOTE — Telephone Encounter (Signed)
Contacted pt to follow-up with canceled appts. Pt says she rescheduled her appt with LCSW to Friday. She said that if she cannot record her physical therapy appts then she will wait until January to attend physical therapy.

## 2018-08-28 ENCOUNTER — Ambulatory Visit: Payer: Medicare Other | Attending: Family Medicine | Admitting: Licensed Clinical Social Worker

## 2018-08-28 DIAGNOSIS — F439 Reaction to severe stress, unspecified: Secondary | ICD-10-CM

## 2018-08-28 NOTE — BH Specialist Note (Signed)
Patient reported difficulty obtaining information regarding appropriate medicare supplement plans. Pharmacy Tech, Feliz Beam, assisted patient with speaking to Medicare representative and asking appropriate questions to determine the best option for her.   Pt reported concerns about mental functioning. Therapeutic interventions to improve skillset were discussed. No additional concerns noted.

## 2018-09-01 ENCOUNTER — Encounter: Payer: Self-pay | Admitting: Sports Medicine

## 2018-09-01 ENCOUNTER — Ambulatory Visit (INDEPENDENT_AMBULATORY_CARE_PROVIDER_SITE_OTHER): Payer: Medicare Other | Admitting: Sports Medicine

## 2018-09-01 DIAGNOSIS — R52 Pain, unspecified: Secondary | ICD-10-CM | POA: Diagnosis not present

## 2018-09-01 DIAGNOSIS — Z9889 Other specified postprocedural states: Secondary | ICD-10-CM

## 2018-09-01 DIAGNOSIS — M779 Enthesopathy, unspecified: Secondary | ICD-10-CM | POA: Diagnosis not present

## 2018-09-01 DIAGNOSIS — L905 Scar conditions and fibrosis of skin: Secondary | ICD-10-CM | POA: Diagnosis not present

## 2018-09-01 DIAGNOSIS — M722 Plantar fascial fibromatosis: Secondary | ICD-10-CM | POA: Diagnosis not present

## 2018-09-01 DIAGNOSIS — S90811A Abrasion, right foot, initial encounter: Secondary | ICD-10-CM

## 2018-09-01 DIAGNOSIS — S93401D Sprain of unspecified ligament of right ankle, subsequent encounter: Secondary | ICD-10-CM

## 2018-09-01 NOTE — Progress Notes (Signed)
Subjective:  Joy Barber is a 57 y.o. female patient who returns to office for follow up evaluation of of a cut at the top of her right foot and pain.  Patient reports that on last week when she was attempting to get out of bed without her cane she bumped her foot and had a cut on the top.  Patient reports that she cleaned the area with peroxide and has also been using peroxide on her toes and reports that she has been experiencing a little swelling but overall continues to be concerned with Bill leaving and the behavioral health supports that she will get.  Patient states that she is planning on rescheduling a few of her therapy appointment and is still waiting on getting orthotics and shoes.  Reports that she still has her certificate to get her discount at [employer].  Patient did bring a record in at today's visit and is reporting today's encounter because she states that things are getting changed around in her medical chart and she wants to have documentation approved of what is going on.  Reports that she is currently taking transportation to get here and also bought a bus pass just in case the transportation cannot bring her.  Patient reports that she talked to the pharmacy about her pain medication and states that she does not want to go to pain management will result in using weed or CBD oil instead of going to pain management.  No other issues at this time.   Patient Active Problem List   Diagnosis Date Noted  . ADHD (attention deficit hyperactivity disorder) 06/11/2013  . PTSD (post-traumatic stress disorder) 06/11/2013  . History of seizures 06/11/2013  . TBI (traumatic brain injury) ([place]) 06/11/2013    Current Outpatient Medications on File Prior to Visit  Medication Sig Dispense Refill  . acetaminophen-codeine (TYLENOL #3) 300-30 MG tablet Take 1 tablet by mouth every 6 (six) hours as needed for moderate pain. Alternate with Tramadol as needed. DO NOT FILL BEFORE 3/28 30 tablet 0  .  Ascorbic Acid (VITAMIN C PO) Take 1 tablet by mouth daily.    . busPIRone (BUSPAR) 15 MG tablet Take 2 tablets (30 mg total) by mouth 2 (two) times daily. 120 tablet 0  . diclofenac (FLECTOR) 1.3 % PTCH Place 1 patch onto the skin 2 (two) times daily. 30 patch 0  . diclofenac (FLECTOR) 1.3 % PTCH Place 1 patch onto the skin 2 (two) times daily. 30 patch 0  . diphenhydramine-acetaminophen (TYLENOL PM) 25-500 MG TABS tablet Take 1 tablet by mouth every morning.    . gabapentin (NEURONTIN) 300 MG capsule Take 1 capsule (300 mg total) by mouth 3 (three) times daily. 90 capsule 0  . gentamicin cream (GARAMYCIN) 0.1 % Apply 1 application topically 3 (three) times daily. 30 g 1  . hydrochlorothiazide (HYDRODIURIL) 25 MG tablet Take 1 tablet (25 mg total) by mouth daily. (Patient taking differently: Take 25 mg by mouth daily as needed (for edema). ) 90 tablet 3  . hydrOXYzine (VISTARIL) 50 MG capsule TAKE 2 CAPSULES BY MOUTH 3 TIMES DAILY AS NEEDED FOR ANXIETY. 90 capsule 2  . NIFEdipine (PROCARDIA-XL/ADALAT-CC/NIFEDICAL-XL) 30 MG 24 hr tablet TAKE 1 TABLET BY MOUTH ONCE DAILY 90 tablet 0  . NON FORMULARY Piracet 800 mg tablets (memory aid): Take 1 tablet by mouth once a day or as otherwise instructed    . Omega-3 Fatty Acids (FISH OIL PO) Take 1 capsule by mouth daily.    Marland Kitchen  sodium chloride (OCEAN) 0.65 % SOLN nasal spray Place 1-2 sprays into both nostrils 3 (three) times daily as needed for congestion.    . traMADol (ULTRAM) 50 MG tablet Take 1 tablet (50 mg total) by mouth every 6 (six) hours as needed. 28 tablet 0   Current Facility-Administered Medications on File Prior to Visit  Medication Dose Route Frequency Provider Last Rate Last Dose  . betamethasone acetate-betamethasone sodium phosphate (CELESTONE) injection 3 mg  3 mg Intramuscular Once Edrick Kins, DPM        Allergies  Allergen Reactions  . Cephalosporins Itching  . Nsaids Other (See Comments)    History of subdural hematoma  .  Penicillins Itching and Other (See Comments)    Welts (also) Has patient had a PCN reaction causing immediate rash, facial/tongue/throat swelling, SOB or lightheadedness with hypotension: Yes Has patient had a PCN reaction causing severe rash involving mucus membranes or skin necrosis: No Has patient had a PCN reaction that required hospitalization: Yes Has patient had a PCN reaction occurring within the last 10 years: No If all of the above answers are "NO", then may proceed with Cephalosporin use.     Objective:  General: Alert and oriented x3 in no acute distress  Dermatology: + thick scar/keloid formation at plantar right foot that is hyper sensitive to touch as previous.  There is a small well-healed scrape at the top of the right foot with no surrounding signs of infection.  Vascular: Dorsalis Pedis and Posterior Tibial pedal pulses intact, Temperature gradient within normal limits.  Neurology: Gross sensation intact via light touch bilateral. (- )Tinels sign bilateral. Subjective burning pain on right.     Musculoskeletal: + tenderness R foot at arch, Range of motion within normal limits with mild guarding on right foot and pain to ankle as well as previously noted  Gait: Antalgic gait rolling walker assisted and cam walker on right.   Assessment and Plan: Problem List Items Addressed This Visit    None    Visit Diagnoses    Abrasion, right foot, initial encounter    -  Primary   Painful scar       Plantar fascial fibromatosis       Post-operative state       Tendonitis       Sprain of right ankle, unspecified ligament, subsequent encounter          -Complete examination performed -Patient recording during this visit -Discussed treatement options for control of pain at scar and ankle pain and abrasion to right foot when patient allowed me to further talk or discuss which was very brief -Advised patient to apply antibiotic cream until scab falls off at the abrasion on the  right foot covered with gauze and a small amount of Coban -Advised patient that I will only refill tramadol to help her get through these last sessions of PT and after if she needs this medication will have to go to pain management like previous -Continue with Flector patchs bilateral -Continue PT  -Patient to get new shoes and insoles from [facility] and did discuss with patient that if we cannot offer her insoles/shoes may consider sending her to [facility] or [facility] meanwhile patient can keep with cam boot to her tolerance and slowly wean to tennis shoe when she gets them with insole -Recommend Behavioral health for further eval/treament with peer support as previous -Patient to return to office PRN or sooner if condition worsens.  Landis Martins, DPM

## 2018-09-02 ENCOUNTER — Ambulatory Visit: Payer: Medicare Other | Admitting: Physical Therapy

## 2018-09-07 ENCOUNTER — Other Ambulatory Visit: Payer: Self-pay | Admitting: Family Medicine

## 2018-09-07 ENCOUNTER — Other Ambulatory Visit: Payer: Self-pay | Admitting: Adult Health Nurse Practitioner

## 2018-09-07 MED FILL — busPIRone HCL 15 MG TABS: 15 | 30 days supply | Qty: 120 | Fill #0

## 2018-09-07 MED FILL — GABAPENTIN 300 MG CAPSULE: 300 | 30 days supply | Qty: 90 | Fill #0

## 2018-09-08 ENCOUNTER — Telehealth: Payer: Self-pay
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
## 2018-09-08 NOTE — Telephone Encounter (Signed)
I called [name] yesterday to inform her that without an appointment we will not be able to provide her with any more refills on her medications. She declined making an appointment with both Dr. Chapman Fitch and Ms. Harris at that time, she wants to wait until March to have another appointment. I let her know that would not satisfy the requirements of the providers, she must be seen in person by a current provider to obtain refills.   I do not believe she understood that we can no longer make exceptions for her, for her own safety we need to ensure she is still taking the right medications and the right doses. She seems to believe that we will not let her run out of her gabapentin or nifedipine since they are important medications. I told her she was still required to be seen but she again states she will wait until March.   She will be in the office for an appt with our LCSW. [name], on Thursday 09/10/18. She has asked for me to help clarify some insurance questions and I will re-iterate the importance of being seen by a PCP.

## 2018-09-09 ENCOUNTER — Encounter: Payer: Medicare Other | Admitting: Physical Therapy

## 2018-09-10 ENCOUNTER — Ambulatory Visit: Payer: Medicare Other | Admitting: Licensed Clinical Social Worker

## 2018-09-11 ENCOUNTER — Telehealth: Payer: Self-pay | Admitting: Sports Medicine

## 2018-09-11 NOTE — Telephone Encounter (Signed)
I can her the referral and discount code last time she was in office for [employer] or [facility] [employer] -Dr.Estelle Skibicki

## 2018-09-11 NOTE — Telephone Encounter (Signed)
Patient is needing a referral for shoe places for her high arches and he foot issues. Please call her

## 2018-09-17 ENCOUNTER — Ambulatory Visit: Payer: Medicare Other | Admitting: Licensed Clinical Social Worker

## 2018-09-17 ENCOUNTER — Encounter: Payer: Medicare Other | Admitting: Physical Therapy

## 2018-09-17 NOTE — Telephone Encounter (Signed)
Patient is wondering if she could get a prescription for the shoes that she needs being that she has medicare.

## 2018-09-19 NOTE — Telephone Encounter (Signed)
She does not need an Rx

## 2018-09-21 ENCOUNTER — Telehealth: Payer: Self-pay | Admitting: Sports Medicine

## 2018-09-21 NOTE — Telephone Encounter (Signed)
Pt called with questions regarding bills she has received that are being sent to collections and would like to know how to go about making a payment. Patient was transferred to billing and did not leave message. Pt immediately called back and refused to leave a voice message for billing and requested that we call her back.

## 2018-09-22 ENCOUNTER — Other Ambulatory Visit: Payer: Self-pay

## 2018-09-22 NOTE — Telephone Encounter (Signed)
Ms. Duty called and left a vm on my direct line, she said she received several pieces of mail about her new medicare policy and information about [employer](medicaid managed care that is indefinitely on hold).   I will call her back later today to discuss her questions.

## 2018-09-22 NOTE — Telephone Encounter (Signed)
I spoke w/ Joy Barber and told her to disregard anything that comes from well care, she let me know that she reached out to her new insurance company and they will be sending her the correct medicare guide for the plan she has selected.   She is worried about her max out of pocket cost and her out of network specialist copays. She wants to make sure if she finds someone who can do some neuro/behavioral training/counseling that she would be able to afford it.  She repots she will be getting her orthotic shoe soon and continuing w/ PT. She asked for additional resources to find a permanent LCSW that can assist her since our LCSW, Delana Meyer is meant for short term help.

## 2018-09-24 ENCOUNTER — Ambulatory Visit: Payer: Medicare Other | Admitting: Licensed Clinical Social Worker

## 2018-09-28 ENCOUNTER — Telehealth: Payer: Self-pay | Admitting: Sports Medicine

## 2018-09-28 NOTE — Telephone Encounter (Signed)
Pt went to [employer] and they referred her to [facility] for her shoes. She was just call to inform.

## 2018-09-28 NOTE — Telephone Encounter (Signed)
Thanks

## 2018-09-29 ENCOUNTER — Telehealth: Payer: Self-pay | Admitting: Sports Medicine

## 2018-09-29 NOTE — Telephone Encounter (Signed)
No there isn't since she is not diabetic

## 2018-09-29 NOTE — Telephone Encounter (Signed)
Dr. Cannon Kettle is there another place she can go for these shoes where she can use her medicare insurance. She called today stating she doesn't have 300.00 cash to pay for shoes.

## 2018-09-30 ENCOUNTER — Ambulatory Visit: Payer: Medicare Other | Attending: Sports Medicine | Admitting: Physical Therapy

## 2018-09-30 ENCOUNTER — Encounter: Payer: Self-pay | Admitting: Physical Therapy

## 2018-09-30 DIAGNOSIS — Z9181 History of falling: Secondary | ICD-10-CM

## 2018-09-30 DIAGNOSIS — M779 Enthesopathy, unspecified: Secondary | ICD-10-CM

## 2018-09-30 DIAGNOSIS — M6281 Muscle weakness (generalized): Secondary | ICD-10-CM | POA: Diagnosis present

## 2018-09-30 DIAGNOSIS — M79671 Pain in right foot: Secondary | ICD-10-CM | POA: Diagnosis present

## 2018-09-30 DIAGNOSIS — R262 Difficulty in walking, not elsewhere classified: Secondary | ICD-10-CM

## 2018-09-30 NOTE — Therapy (Signed)
[facility] [address] [place], [place], [address] Phone: [phone]   Fax:  [phone]  Physical Therapy Treatment/ERO  Patient Details  Name: Joy Barber MRN: [id number] Date of Birth: May 25, 1961 Referring Provider (PT): Asencion Islam, [place]   Encounter Date: 09/30/2018  PT End of Session - 09/30/18 1511    Visit Number  7    Number of Visits  13    Date for PT Re-Evaluation  11/13/18    Authorization Type  MCR    PT Start Time  1506    PT Stop Time  1550    PT Time Calculation (min)  44 min    Activity Tolerance  Patient tolerated treatment well    Behavior During Therapy  Agitated       Past Medical History:  Diagnosis Date  . ADHD (attention deficit hyperactivity disorder)   . Allergy   . Anxiety   . Complication of anesthesia    "to ether anesthesia when I was 58 years old, woke up with N/V"  . GERD (gastroesophageal reflux disease)   . Head injury    post-concusive syndrome  . Headache   . Hypertension   . OCD (obsessive compulsive disorder)   . PTSD (post-traumatic stress disorder)   . Seizures (HCC)    s/p TBI  . Subdural hematoma ([facility]) sept 2012    Past Surgical History:  Procedure Laterality Date  . ABDOMINAL HYSTERECTOMY    . COSMETIC SURGERY  1975   forehead  . PLANTAR FASCIA RELEASE Right 05/22/2017   Procedure: excision PLANTAR fibromas;  Surgeon: Felecia Shelling, DPM;  Location: [facility] OR;  Service: Podiatry;  Laterality: Right;    There were no vitals filed for this visit.  Subjective Assessment - 09/30/18 1511    Subjective  [facility] [employer] and was referred to [facility] for fitting. Did not go and was told by YB that there were $19 inserts she could use instead but she did not get them. Took nerve medicine before I came. I called [facility] and have apt on Tues, orthotics are $400 and will make payments. Having the same pain on the bottom of my foot and wake with stinging sensations at the bottom  of my foot right where he cut the thing off. Started putting anti-inflam package on my great toe. Callous base of 5th metatarsal from weight bearing.     Patient Stated Goals  be able to walk without AD, decrease pain, improve balance- shower, bike    Currently in Pain?  Yes    Pain Score  5     Pain Location  Foot    Pain Orientation  Right    Aggravating Factors   walking on foot when placing foot flat         [facility] PT Assessment - 09/30/18 0001      Assessment   Medical Diagnosis  tendinitis    Referring Provider (PT)  Asencion Islam, DPM    Onset Date/Surgical Date  05/22/18      Sensation   Additional Comments  cont numbness at surgical incision and in great toe, decrease "jammed" sensation      Strength   Overall Strength Comments  unable to tolerate testing for DF strength due to pain      Palpation   Palpation comment  allowed light palpation- notable hypomobiility at surgical site      Ambulation/Gait   Gait Comments  ambulating with rollator and boot on  Right foot, figure 8 brace on left foot.                    [facility] Adult PT Treatment/Exercise - 09/30/18 0001      Iontophoresis   Type of Iontophoresis  Dexamethasone    Location  plantar aspect of Rt foot    Dose  1cc    Time  6 hr wear      Ankle Exercises: Stretches   Other Stretch  great toe extension 4x10s      Ankle Exercises: Seated   Heel Raises Limitations  squeeze fists bw knees    Other Seated Ankle Exercises  squeeze fists bw knees and touch ball of foot &great toe to floor      Ankle Exercises: Standing   Other Standing Ankle Exercises  standing on airex unsupported 5x10s               PT Short Term Goals - 07/01/18 1200      PT SHORT TERM GOAL #1   Title  pt will be able to demo HEP without requiring cues for form    Baseline  minimal compliance    Status  On-going      PT SHORT TERM GOAL #2   Title  pt will be able to lay the plantar surface of her foot on the  floor without weight bearing    Baseline  able    Status  Achieved        PT Long Term Goals - 09/30/18 1520      PT LONG TERM GOAL #1   Title  Pt will demo DF to at least 5 deg for required ROM during ambulation    Status  Achieved      PT LONG TERM GOAL #2   Title  Pt will be able to balance enough to feel safe taking a shower on an acceptable basis for her    Baseline  chair and mat, holds on to walls, feels safe to step over tub independently    Status  Partially Met      PT LONG TERM GOAL #3   Title  pt will be able to utilize both feet on the ground for ambulation with SPC to move around her home    Baseline  rollater in community, at hotel room uses [facility] and places weight through heel and lateral foot when not wearing boot, not rocking over boot due to fear of falling    Status  On-going      PT LONG TERM GOAL #4   Title  pt will report pain levels <=4/10 with functional daily ambulation    Baseline  4-5/10 when smoking     Status  On-going            Plan - 09/30/18 1558    Clinical Impression Statement  Reviewed goals today for extension of POC which pt was agreeable to. Good tolerance to seated exercise and placement of foot unweighted on floor. Progressed to unsupported standing barefoot on airex for short periods which pt reported stinging but resolved with rest. Will continue to benefit from skilled PT in order to progress graded exposure and progress toward normalized gait pattern and long term goals.     PT Treatment/Interventions  ADLs/Self Care Home Management;Cryotherapy;Moist Heat;Therapeutic exercise;Therapeutic activities;Functional mobility training;Stair training;Gait training;Ultrasound;DME Instruction;Balance training;Neuromuscular re-education;Patient/family education;Manual techniques;Taping;Passive range of motion;Iontophoresis 4mg /ml Dexamethasone;Electrical Stimulation    PT Next Visit Plan  graded exposure as tolerated  PT Home Exercise Plan   ankle AROM in all planes, try to put foot on floor. seated ball squeeze + LAQ,, sidelying hip abd, prone hip ext; roller to plantar aspect of foot, great toe extension stretch; stand in walker, black tband PF    Consulted and Agree with Plan of Care  Patient   recorded our session today in private room      Patient will benefit from skilled therapeutic intervention in order to improve the following deficits and impairments:  Abnormal gait, Decreased endurance, Impaired sensation, Decreased activity tolerance, Decreased strength, Pain, Difficulty walking, Decreased mobility, Decreased balance, Decreased range of motion, Improper body mechanics, Impaired flexibility, Decreased coordination  Visit Diagnosis: Pain in right foot - Plan: PT plan of care cert/re-cert  Difficulty in walking, not elsewhere classified - Plan: PT plan of care cert/re-cert  History of falling - Plan: PT plan of care cert/re-cert  Tendinitis - Plan: PT plan of care cert/re-cert  Muscle weakness (generalized) - Plan: PT plan of care cert/re-cert     Problem List Patient Active Problem List   Diagnosis Date Noted  . ADHD (attention deficit hyperactivity disorder) 06/11/2013  . PTSD (post-traumatic stress disorder) 06/11/2013  . History of seizures 06/11/2013  . TBI (traumatic brain injury) ([facility]) 06/11/2013    Uchechukwu Dhawan C. Mechel Haggard PT, DPT 09/30/18 4:03 PM   [facility] [address] [place], [place], [address] Phone: [phone]   Fax:  [phone]  Name: Joy Barber MRN: [id number] Date of Birth: December 04, 1960

## 2018-10-01 ENCOUNTER — Encounter: Payer: No Typology Code available for payment source | Admitting: Psychology

## 2018-10-02 ENCOUNTER — Telehealth: Payer: Self-pay | Admitting: Licensed Clinical Social Worker

## 2018-10-02 NOTE — Telephone Encounter (Signed)
Contacted pt on 10/01/2018 to follow up with pt in regards to attending physical therapy appt. Pt was upset and did not want to speak. Pt stated she wanted silence.

## 2018-10-06 ENCOUNTER — Ambulatory Visit: Payer: Medicare Other | Admitting: Sports Medicine

## 2018-10-07 ENCOUNTER — Telehealth: Payer: Self-pay | Admitting: Licensed Clinical Social Worker

## 2018-10-07 ENCOUNTER — Ambulatory Visit: Payer: Medicare Other | Admitting: Physical Therapy

## 2018-10-07 NOTE — Telephone Encounter (Signed)
Returned pt call. Pt was concerned with recent change to [facility] services. Informed pt that I would call [facility] to confirm recent changes. Pt also stated that she also no longer needed assistance with her physical therapy appointments.

## 2018-10-14 ENCOUNTER — Telehealth: Payer: Self-pay

## 2018-10-14 ENCOUNTER — Ambulatory Visit: Payer: Medicare Other | Admitting: Physical Therapy

## 2018-10-14 NOTE — Telephone Encounter (Signed)
Returned patients call and spoke for 30 mins, med list was updated to reflect meds no longer taken.

## 2018-10-15 ENCOUNTER — Telehealth: Payer: Self-pay

## 2018-10-15 NOTE — Telephone Encounter (Signed)
Joy Barber called and left a VM for me yesterday after I left the office. I called to return her call and left a brief message letting her know to call me back when she can.

## 2018-10-15 NOTE — Telephone Encounter (Signed)
Pt called back, she was worried paperwork was completed without her while we were on the phone yesterday. I clarified that I was able to update her medlist when I documented that we spoke on the phone.  [name] would like a call back from [name] to confirm when they can meet at [facility] to take care of her problem with them. She would also like to make sure Delana Meyer is available to be with her during her appointment on 2/18 w/ Lavell Anchors at [facility]. Please call patient back at your convenience.

## 2018-10-16 ENCOUNTER — Telehealth: Payer: Self-pay | Admitting: Sports Medicine

## 2018-10-16 NOTE — Telephone Encounter (Signed)
Pt called and had some questions about orthotics. Pt notified me that the conversation was being recorded. She was scheduled to see Dr Cannon Kettle on 1.28.2020 and wants to get orthotic molded on same day. I added her to Rick's schedule.She does not want to see Liliane Channel without Dr Cannon Kettle present. She was asking about the cost and I told her 398.00 and we request 100.00 down and to make payments.([facility] allows 12 months to pay bal off and it is about 24.84 a month) She then asked about shoes because she does not like to pay a lot for shoes. I explained that once she gets the orthotics she can take them with her and try them in the shoes she is looking at to make sure they fit. She asked how long it takes to get them and I told her about 3 weeks.

## 2018-10-20 ENCOUNTER — Encounter: Payer: Self-pay | Admitting: Sports Medicine

## 2018-10-20 ENCOUNTER — Ambulatory Visit: Payer: Medicare Other | Admitting: Orthotics

## 2018-10-20 ENCOUNTER — Ambulatory Visit: Payer: Self-pay | Admitting: Sports Medicine

## 2018-10-20 DIAGNOSIS — L905 Scar conditions and fibrosis of skin: Secondary | ICD-10-CM

## 2018-10-20 DIAGNOSIS — R52 Pain, unspecified: Principal | ICD-10-CM

## 2018-10-20 DIAGNOSIS — M79676 Pain in unspecified toe(s): Secondary | ICD-10-CM

## 2018-10-20 DIAGNOSIS — M722 Plantar fascial fibromatosis: Secondary | ICD-10-CM

## 2018-10-20 DIAGNOSIS — Z9889 Other specified postprocedural states: Secondary | ICD-10-CM

## 2018-10-20 NOTE — Progress Notes (Signed)
Patient came into today to be cast for Custom Foot Orthotics. Upon recommendation of Dr. Cannon Kettle  Patient presents with keratomas 2/5 Left, 5 Right and painful scar Goals are offload keratomas Plan vendor [facility]

## 2018-10-20 NOTE — Progress Notes (Signed)
Subjective:  Joy Barber is a 58 y.o. female patient who returns to office for follow up evaluation of right greater than left foot pain and for casting for custom insoles.  Patient reports that she is entirely frustrated and concerned about her transportation on the bus system.  Patient reports that they are telling her that her current green card is not valid and she must be switched to the blue card and she feels frustrated because this is affecting her ability to get to her appointments.  Patient also reports that she has saved her money and desires to have the custom orthotics and has planned to be able to have the initial deposit pain today with the remaining balance to be paid over a frame of 12 months.  No other issues at this time.   Patient Active Problem List   Diagnosis Date Noted  . ADHD (attention deficit hyperactivity disorder) 06/11/2013  . PTSD (post-traumatic stress disorder) 06/11/2013  . History of seizures 06/11/2013  . TBI (traumatic brain injury) ([place]) 06/11/2013    Current Outpatient Medications on File Prior to Visit  Medication Sig Dispense Refill  . Ascorbic Acid (VITAMIN C PO) Take 1 tablet by mouth daily.    . busPIRone (BUSPAR) 15 MG tablet Take 2 tablets (30 mg total) by mouth 2 (two) times daily. 120 tablet 0  . diclofenac (FLECTOR) 1.3 % PTCH Place 1 patch onto the skin 2 (two) times daily. 30 patch 0  . diphenhydramine-acetaminophen (TYLENOL PM) 25-500 MG TABS tablet Take 1 tablet by mouth every morning.    . gabapentin (NEURONTIN) 300 MG capsule Take 1 capsule (300 mg total) by mouth 3 (three) times daily. 90 capsule 0  . hydrochlorothiazide (HYDRODIURIL) 25 MG tablet Take 1 tablet (25 mg total) by mouth daily. (Patient taking differently: Take 25 mg by mouth daily as needed (for edema). ) 90 tablet 3  . hydrOXYzine (VISTARIL) 50 MG capsule TAKE 2 CAPSULES BY MOUTH 3 TIMES DAILY AS NEEDED FOR ANXIETY. 90 capsule 2  . NIFEdipine  (PROCARDIA-XL/ADALAT-CC/NIFEDICAL-XL) 30 MG 24 hr tablet TAKE 1 TABLET BY MOUTH ONCE DAILY 90 tablet 0  . NON FORMULARY Piracet 800 mg tablets (memory aid): Take 1 tablet by mouth once a day or as otherwise instructed    . Omega-3 Fatty Acids (FISH OIL PO) Take 1 capsule by mouth daily.    . sodium chloride (OCEAN) 0.65 % SOLN nasal spray Place 1-2 sprays into both nostrils 3 (three) times daily as needed for congestion.     Current Facility-Administered Medications on File Prior to Visit  Medication Dose Route Frequency Provider Last Rate Last Dose  . betamethasone acetate-betamethasone sodium phosphate (CELESTONE) injection 3 mg  3 mg Intramuscular Once Edrick Kins, DPM        Allergies  Allergen Reactions  . Cephalosporins Itching  . Nsaids Other (See Comments)    History of subdural hematoma  . Penicillins Itching and Other (See Comments)    Welts (also) Has patient had a PCN reaction causing immediate rash, facial/tongue/throat swelling, SOB or lightheadedness with hypotension: Yes Has patient had a PCN reaction causing severe rash involving mucus membranes or skin necrosis: No Has patient had a PCN reaction that required hospitalization: Yes Has patient had a PCN reaction occurring within the last 10 years: No If all of the above answers are "NO", then may proceed with Cephalosporin use.     Objective:  General: Alert and oriented x3 in no acute distress however patient's  personality and TBI tends to make patient go off on a tangent and get very focused on expressing her frustrations about a particular issue   Dermatology: + thick scar/keloid formation at plantar right foot that is hyper sensitive to touch as previous.  There is a small raised soft tissue mass along the plantar fashion on the left foot likely consistent with fibroma however not sensitive likely surgical scar. Plantar forefoot calluses bilateral.  Vascular: Dorsalis Pedis and Posterior Tibial pedal pulses  intact, Temperature gradient within normal limits.  Neurology: Gross sensation intact via light touch bilateral. (- )Tinels sign bilateral. Subjective burning pain on right.     Musculoskeletal: + tenderness R foot at arch, Range of motion within normal limits with mild guarding on right foot   Gait: Antalgic gait rolling walker assisted and cam walker on right.   Assessment and Plan: Problem List Items Addressed This Visit    None    Visit Diagnoses    Painful scar    -  Primary   Plantar fascial fibromatosis          -Complete examination performed -Patient expressed her concern with issues with her transportation I advised patient to contact [place] transit authority to further discuss -Discussed with patient custom insoles and patient is in agreement that she wants to get custom insoles and has any finances to do so because she wants the best insoles for her foot and wants to hold off on finishing therapy until she has gotten the new insoles -Patient was casted today for custom insoles by Liliane Channel with accommodation to offload problem areas -Advised patient to refrain from buying new shoes until she has her new orthotics in 3 weeks -Continue with Flector patch for pain about the right foot -Recommend continue with behavioral health with peer support/liaison as previous to help her address transportation issues and follow-up appointment issues with other specialties -Patient to return to office pickup orthotics or sooner if condition worsens.  Landis Martins, DPM

## 2018-10-21 ENCOUNTER — Encounter: Payer: Medicare Other | Admitting: Physical Therapy

## 2018-10-22 ENCOUNTER — Telehealth: Payer: Self-pay | Admitting: Licensed Clinical Social Worker

## 2018-10-22 NOTE — Telephone Encounter (Signed)
Pt contacted me in reference to SCAT. Pt is in ned of new SCAT card and is requesting paperwork that explains the level of SCAT she currently has. Pt had an appt scheduled tomorrow on 10/23/2018 and has canceled. Reports she will rely on a friend or medicare to transport her to appts until [employer] agrees to give her paperwork. Pt is also concerned about Medicare plan.    I contacted SCAT representative in reference to pt request for paperwork. Currently waiting to hear back.

## 2018-10-23 ENCOUNTER — Telehealth: Payer: Self-pay | Admitting: Licensed Clinical Social Worker

## 2018-10-23 NOTE — Telephone Encounter (Signed)
Returned pt call. Informed pt that I spoke with [employer]AT representative and that they will not be able to issue new paperwork until she has to renew her SCAT in May. Pt expressed concerns about medicare plans and housing. Will follow up with pt.

## 2018-10-28 ENCOUNTER — Ambulatory Visit: Payer: Medicare Other | Admitting: Physical Therapy

## 2018-11-04 ENCOUNTER — Encounter: Payer: Medicare Other | Admitting: Physical Therapy

## 2018-11-06 ENCOUNTER — Telehealth: Payer: Self-pay

## 2018-11-06 NOTE — Telephone Encounter (Signed)
Called and confirmed that patient would be at appt w/. Lavell Anchors @ [facility] on 11/10/18 and that [facility] would be available to work with her during the appt.

## 2018-11-09 ENCOUNTER — Telehealth: Payer: Self-pay

## 2018-11-09 NOTE — Telephone Encounter (Signed)
Pt called very concerned with her insurance covering her upcoming appointment with Lavell Anchors, FNP. We had a three way phone call with her insurance company [phone]) and spoke with Herbie Baltimore who confirmed that Joy Barber is not required to choose a PCP per her insurance plan and the the [facility] is in network and due to her low income subsidy she will pay $1.30 for generics and $3.90 for brand name drugs regardless of where she goes but Joy Barber is very adamant about only using [facility] pharmacy for continuity of care. The patient was also very concerned that someone may use her pharmacy benefits without her knowledge, she requests to be 'locked into' [facility] which is not something they can do. After speaking with the insurance rep she decided to call the insurance periodically to ensure that the only pharmacy claims listed are from the [facility] pharmacy.   Joy Barber also requested that I come to her appointment tomorrow but I let her know that is outside of my scope and I would not be able to come, LCSW will be available during patients appointment. She wants to ensure that her wishes are honored in regard to her medical treatment. The following are her top three concerns for this appointment:  1. She not be put on any new medications 2. She gets refills on her current meds 3. She is told how often she is expected to see Ms. Harris for follow-ups.  I clearly told Joy Barber that as her provider Ms. [name] can recommend new medications but it is up to her if she wants to take them but recommendations made should be taken very seriously. She was also told that how many refills she gets and when she is required to follow-up is at the discretion of her provider. Joy Barber stated that she understood this and that she will be at her appointment tomorrow 11/10/18 at 3:50pm.

## 2018-11-10 ENCOUNTER — Ambulatory Visit: Payer: Medicare Other | Admitting: Family Medicine

## 2018-11-10 ENCOUNTER — Encounter: Payer: Medicare Other | Admitting: Licensed Clinical Social Worker

## 2018-11-10 ENCOUNTER — Encounter: Payer: Self-pay | Admitting: Sports Medicine

## 2018-11-10 ENCOUNTER — Ambulatory Visit (INDEPENDENT_AMBULATORY_CARE_PROVIDER_SITE_OTHER): Payer: Medicare Other | Admitting: Sports Medicine

## 2018-11-10 ENCOUNTER — Ambulatory Visit: Payer: Medicare Other | Admitting: Orthotics

## 2018-11-10 DIAGNOSIS — Z87820 Personal history of traumatic brain injury: Secondary | ICD-10-CM | POA: Diagnosis not present

## 2018-11-10 DIAGNOSIS — R52 Pain, unspecified: Secondary | ICD-10-CM | POA: Diagnosis not present

## 2018-11-10 DIAGNOSIS — Z9889 Other specified postprocedural states: Secondary | ICD-10-CM

## 2018-11-10 DIAGNOSIS — L905 Scar conditions and fibrosis of skin: Secondary | ICD-10-CM

## 2018-11-10 DIAGNOSIS — M722 Plantar fascial fibromatosis: Secondary | ICD-10-CM

## 2018-11-10 NOTE — Patient Instructions (Signed)

## 2018-11-10 NOTE — Progress Notes (Signed)
Patient came in today to pick up custom made foot orthotics.  The goals were accomplished and the patient reported no dissatisfaction with said orthotics.  Patient was advised of breakin period and how to report any issues. 

## 2018-11-10 NOTE — Progress Notes (Signed)
Subjective:  Joy Barber is a 58 y.o. female patient who returns to office for follow up evaluation of right greater than left foot pain and for dispensing of orthotics. Patient reports multiple concerns at this visit.  No other issues at this time.   Patient Active Problem List   Diagnosis Date Noted  . ADHD (attention deficit hyperactivity disorder) 06/11/2013  . PTSD (post-traumatic stress disorder) 06/11/2013  . History of seizures 06/11/2013  . TBI (traumatic brain injury) ([place]) 06/11/2013    Current Outpatient Medications on File Prior to Visit  Medication Sig Dispense Refill  . Ascorbic Acid (VITAMIN C PO) Take 1 tablet by mouth daily.    . busPIRone (BUSPAR) 15 MG tablet Take 2 tablets (30 mg total) by mouth 2 (two) times daily. 120 tablet 0  . diclofenac (FLECTOR) 1.3 % PTCH Place 1 patch onto the skin 2 (two) times daily. 30 patch 0  . diphenhydramine-acetaminophen (TYLENOL PM) 25-500 MG TABS tablet Take 1 tablet by mouth every morning.    . gabapentin (NEURONTIN) 300 MG capsule Take 1 capsule (300 mg total) by mouth 3 (three) times daily. 90 capsule 0  . hydrochlorothiazide (HYDRODIURIL) 25 MG tablet Take 1 tablet (25 mg total) by mouth daily. (Patient taking differently: Take 25 mg by mouth daily as needed (for edema). ) 90 tablet 3  . hydrOXYzine (VISTARIL) 50 MG capsule TAKE 2 CAPSULES BY MOUTH 3 TIMES DAILY AS NEEDED FOR ANXIETY. 90 capsule 2  . NIFEdipine (PROCARDIA-XL/ADALAT-CC/NIFEDICAL-XL) 30 MG 24 hr tablet TAKE 1 TABLET BY MOUTH ONCE DAILY 90 tablet 0  . NON FORMULARY Piracet 800 mg tablets (memory aid): Take 1 tablet by mouth once a day or as otherwise instructed    . Omega-3 Fatty Acids (FISH OIL PO) Take 1 capsule by mouth daily.    . sodium chloride (OCEAN) 0.65 % SOLN nasal spray Place 1-2 sprays into both nostrils 3 (three) times daily as needed for congestion.     Current Facility-Administered Medications on File Prior to Visit  Medication Dose Route  Frequency Provider Last Rate Last Dose  . betamethasone acetate-betamethasone sodium phosphate (CELESTONE) injection 3 mg  3 mg Intramuscular Once Edrick Kins, DPM        Allergies  Allergen Reactions  . Cephalosporins Itching  . Nsaids Other (See Comments)    History of subdural hematoma  . Penicillins Itching and Other (See Comments)    Welts (also) Has patient had a PCN reaction causing immediate rash, facial/tongue/throat swelling, SOB or lightheadedness with hypotension: Yes Has patient had a PCN reaction causing severe rash involving mucus membranes or skin necrosis: No Has patient had a PCN reaction that required hospitalization: Yes Has patient had a PCN reaction occurring within the last 10 years: No If all of the above answers are "NO", then may proceed with Cephalosporin use.     Objective:  General: Alert and oriented x3 in no acute distress however patient's personality and TBI tends to make patient go off on a tangent and get very focused on expressing her frustrations about a particular issue   Dermatology: + thick scar/keloid formation at plantar right foot that is hyper sensitive to touch as previous.  There is a small raised soft tissue mass along the plantar fashion on the left foot likely consistent with fibroma however not sensitive likely surgical scar. Plantar forefoot calluses bilateral.  Vascular: Dorsalis Pedis and Posterior Tibial pedal pulses intact, Temperature gradient within normal limits.  Neurology: Gross sensation intact  via light touch bilateral. (- )Tinels sign bilateral. Subjective burning pain on right.     Musculoskeletal: + tenderness R foot at arch and 1st toe joint, Range of motion within normal limits with mild guarding on right foot   Gait: Antalgic gait rolling walker assisted and cam walker on right once insole was placed on left foot inside her shoe she reports that if felt good   Assessment and Plan: Problem List Items Addressed  This Visit    None    Visit Diagnoses    Plantar fascial fibromatosis    -  Primary   Painful scar       Post-operative state       History of traumatic brain injury          -Complete examination performed -Dispensed custom orthotics with break in period explained. Patient to now go get new shoes at [employer] or [employer] or at a place with the recommended brands -Patient to resume PT next week  -Continue with Flector patch for pain about the right foot -Recommend continue with behavioral health with peer support/liaison as previous to help her address transportation issues or even contact her health insurance about other transportation options  -Patient to return to office as needed or sooner if condition worsens.  Landis Martins, DPM

## 2018-11-11 ENCOUNTER — Telehealth: Payer: Self-pay | Admitting: Sports Medicine

## 2018-11-11 ENCOUNTER — Encounter: Payer: Medicare Other | Admitting: Physical Therapy

## 2018-11-11 NOTE — Telephone Encounter (Signed)
No appointment is needed only if return if something is bothering her with her new orthotics. -Dr. Cannon Kettle

## 2018-11-11 NOTE — Telephone Encounter (Signed)
Patient received orthotics on Tuesday and she wanted to know if she would need to schedule an appointment 2-4 weeks per the instructions that was giving to her.

## 2018-11-13 ENCOUNTER — Telehealth: Payer: Self-pay | Admitting: Licensed Clinical Social Worker

## 2018-11-13 NOTE — Telephone Encounter (Signed)
Contacted pt to see if she needed assistance with upcoming physical therapy appointment. No assistance needed. Pt stated that she was recording our conversation.

## 2018-11-18 ENCOUNTER — Ambulatory Visit: Payer: Medicare Other | Attending: Sports Medicine | Admitting: Physical Therapy

## 2018-11-18 ENCOUNTER — Encounter: Payer: Self-pay | Admitting: Physical Therapy

## 2018-11-18 DIAGNOSIS — R262 Difficulty in walking, not elsewhere classified: Secondary | ICD-10-CM | POA: Diagnosis present

## 2018-11-18 DIAGNOSIS — M6281 Muscle weakness (generalized): Secondary | ICD-10-CM | POA: Insufficient documentation

## 2018-11-18 DIAGNOSIS — M79671 Pain in right foot: Secondary | ICD-10-CM | POA: Diagnosis not present

## 2018-11-18 DIAGNOSIS — M779 Enthesopathy, unspecified: Secondary | ICD-10-CM | POA: Diagnosis present

## 2018-11-18 NOTE — Therapy (Signed)
[facility] [address] [place], [place], [address] Phone: [phone]   Fax:  [phone]  Physical Therapy Treatment/ERO Progress Note Reporting Period 05/13/2019 to 11/18/2018  See note below for Objective Data and Assessment of Progress/Goals.       Patient Details  Name: Joy Barber MRN: [id number] Date of Birth: 1961/08/14 Referring Provider (PT): Asencion Islam, [place]   Encounter Date: 11/18/2018  PT End of Session - 11/18/18 1420    Visit Number  8    Number of Visits  13    Date for PT Re-Evaluation  12/18/18    Authorization Type  MCR    PT Start Time  1405    PT Stop Time  1505    PT Time Calculation (min)  60 min    Behavior During Therapy  Anxious;Restless       Past Medical History:  Diagnosis Date  . ADHD (attention deficit hyperactivity disorder)   . Allergy   . Anxiety   . Complication of anesthesia    "to ether anesthesia when I was 58 years old, woke up with N/V"  . GERD (gastroesophageal reflux disease)   . Head injury    post-concusive syndrome  . Headache   . Hypertension   . OCD (obsessive compulsive disorder)   . PTSD (post-traumatic stress disorder)   . Seizures (HCC)    s/p TBI  . Subdural hematoma (HCC) sept 2012    Past Surgical History:  Procedure Laterality Date  . ABDOMINAL HYSTERECTOMY    . COSMETIC SURGERY  1975   forehead  . PLANTAR FASCIA RELEASE Right 05/22/2017   Procedure: excision PLANTAR fibromas;  Surgeon: Felecia Shelling, DPM;  Location: [facility] OR;  Service: Podiatry;  Laterality: Right;    There were no vitals filed for this visit.  Subjective Assessment - 11/18/18 1732    Subjective  I had medication changes. I got new shoes and an insert. Still walking on the side of my foot. I am seeing colors and I am not sure if it's PTSD or my head injury. I cancelled all of my appointments after 1/8 because I had my bell rung without getting my bell rung. Transportation  issues with bus pass. I can get through a whole cross word puzzle but not a sudoku.     Currently in Pain?  Yes    Pain Score  --   not stated   Pain Location  Foot    Pain Orientation  Right   plantar aspect   Pain Descriptors / Indicators  --   stinging   Aggravating Factors   foot flat    Pain Relieving Factors  walking on the side of my foot         [facility] PT Assessment - 11/18/18 0001      Assessment   Medical Diagnosis  tendinitis    Referring Provider (PT)  Asencion Islam, DPM    Onset Date/Surgical Date  05/22/18    Hand Dominance  Right    Prior Therapy  12/08/18      Precautions   Precautions  None      Restrictions   Weight Bearing Restrictions  No      Balance Screen   Has the patient fallen in the past 6 months  No      Home Environment   Living Environment  Other (Comment)    Additional Comments  hotel room      Prior Function  Level of Independence  Independent      Sensation   Additional Comments  stinging in foot, toe feels jammed, numbness is occasional at this point      AROM   Right Ankle Dorsiflexion  2    Right Ankle Plantar Flexion  30    Right Ankle Inversion  15    Right Ankle Eversion  5      Ambulation/Gait   Gait Comments  rollator, tennis shoes, WB on lateral foot                   [facility] Adult PT Treatment/Exercise - 11/18/18 0001      Iontophoresis   Type of Iontophoresis  Dexamethasone    Location  plantar aspect of Rt foot    Dose  1cc    Time  6 hr wear             PT Education - 11/18/18 1729    Education Details  see plan    Person(s) Educated  Patient    Comprehension  Verbalized understanding       PT Short Term Goals - 07/01/18 1200      PT SHORT TERM GOAL #1   Title  pt will be able to demo HEP without requiring cues for form    Baseline  minimal compliance    Status  On-going      PT SHORT TERM GOAL #2   Title  pt will be able to lay the plantar surface of her foot on the floor  without weight bearing    Baseline  able    Status  Achieved        PT Long Term Goals - 11/18/18 1730      PT LONG TERM GOAL #1   Title  Pt will demo DF to at least 5 deg for required ROM during ambulation    Status  Achieved      PT LONG TERM GOAL #2   Title  Pt will be able to balance enough to feel safe taking a shower on an acceptable basis for her    Baseline  chair and mat, holds on to walls, feels safe to step over tub independently    Status  Partially Met      PT LONG TERM GOAL #3   Title  pt will be able to utilize both feet on the ground for ambulation with SPC to move around her home    Baseline  using a rollator but is walking in tennis shoes, still only placing weight on lateral foot    Status  On-going      PT LONG TERM GOAL #4   Title  pt will report pain levels <=4/10 with functional daily ambulation    Baseline  unbearable to put weight on foot    Status  On-going            Plan - 11/18/18 1731    Clinical Impression Statement  Limited time available for re-evaluation today due to pt request to talk to [place] about insurance. Pt has progressed to ambulating with tennis shoes nd ankle brace with rollator. I have asked her to practice bearing weight in standing with her foot flat at home.     PT Treatment/Interventions  ADLs/Self Care Home Management;Cryotherapy;Moist Heat;Therapeutic exercise;Therapeutic activities;Functional mobility training;Stair training;Gait training;Ultrasound;DME Instruction;Balance training;Neuromuscular re-education;Patient/family education;Manual techniques;Taping;Passive range of motion;Iontophoresis 4mg /ml Dexamethasone;Electrical Stimulation    PT Next Visit Plan  graded exposure as tolerated  PT Home Exercise Plan  ankle AROM in all planes, try to put foot on floor. seated ball squeeze + LAQ,, sidelying hip abd, prone hip ext; roller to plantar aspect of foot, great toe extension stretch; stand in walker, black tband PF     Consulted and Agree with Plan of Care  Patient       Patient will benefit from skilled therapeutic intervention in order to improve the following deficits and impairments:  Abnormal gait, Decreased endurance, Impaired sensation, Decreased activity tolerance, Decreased strength, Pain, Difficulty walking, Decreased mobility, Decreased balance, Decreased range of motion, Improper body mechanics, Impaired flexibility, Decreased coordination  Visit Diagnosis: Pain in right foot - Plan: PT plan of care cert/re-cert  Difficulty in walking, not elsewhere classified - Plan: PT plan of care cert/re-cert  Tendinitis - Plan: PT plan of care cert/re-cert  Muscle weakness (generalized) - Plan: PT plan of care cert/re-cert     Problem List Patient Active Problem List   Diagnosis Date Noted  . ADHD (attention deficit hyperactivity disorder) 06/11/2013  . PTSD (post-traumatic stress disorder) 06/11/2013  . History of seizures 06/11/2013  . TBI (traumatic brain injury) ([facility]) 06/11/2013   Camari Quintanilla C. Bunnie Rehberg PT, DPT 11/18/18 5:39 PM   [facility] [address] [place], [place], [address] Phone: [phone]   Fax:  [phone]  Name: Joy Barber MRN: [id number] Date of Birth: Sep 20, 1961

## 2018-11-24 ENCOUNTER — Encounter: Payer: Self-pay | Admitting: Physical Therapy

## 2018-11-24 ENCOUNTER — Other Ambulatory Visit: Payer: Self-pay

## 2018-11-24 ENCOUNTER — Ambulatory Visit: Payer: Medicare Other | Attending: Sports Medicine | Admitting: Physical Therapy

## 2018-11-24 DIAGNOSIS — Z9181 History of falling: Secondary | ICD-10-CM | POA: Insufficient documentation

## 2018-11-24 DIAGNOSIS — M6281 Muscle weakness (generalized): Secondary | ICD-10-CM | POA: Insufficient documentation

## 2018-11-24 DIAGNOSIS — R262 Difficulty in walking, not elsewhere classified: Secondary | ICD-10-CM | POA: Diagnosis present

## 2018-11-24 DIAGNOSIS — M79671 Pain in right foot: Secondary | ICD-10-CM | POA: Diagnosis not present

## 2018-11-24 DIAGNOSIS — M779 Enthesopathy, unspecified: Secondary | ICD-10-CM | POA: Diagnosis present

## 2018-11-24 NOTE — Therapy (Signed)
Difficulty in walking, not elsewhere classified  Tendinitis  Muscle weakness (generalized)     Problem List Patient Active Problem List   Diagnosis Date Noted  . ADHD (attention deficit hyperactivity disorder) 06/11/2013  . PTSD (post-traumatic stress disorder) 06/11/2013  . History of seizures 06/11/2013  . TBI (traumatic brain injury) ([place]) 06/11/2013    Carolos Fecher C. Carmesha Morocco PT, DPT 11/24/18 5:26 PM   [facility] [address] [place], [place], [address] Phone: [phone]   Fax:  [phone]  Name: JAYLEA PLOURDE MRN: [id number] Date of Birth: [birth year]  Difficulty in walking, not elsewhere classified  Tendinitis  Muscle weakness (generalized)     Problem List Patient Active Problem List   Diagnosis Date Noted  . ADHD (attention deficit hyperactivity disorder) 06/11/2013  . PTSD (post-traumatic stress disorder) 06/11/2013  . History of seizures 06/11/2013  . TBI (traumatic brain injury) ([place]) 06/11/2013    Carolos Fecher C. Carmesha Morocco PT, DPT 11/24/18 5:26 PM   [facility] [address] [place], [place], [address] Phone: [phone]   Fax:  [phone]  Name: JAYLEA PLOURDE MRN: [id number] Date of Birth: [birth year]  [place], [place], [address] Phone: [phone]   Fax:  [phone]  Physical Therapy Treatment  Patient Details  Name: DARIELYS GIGLIA MRN: [id number] Date of Birth: 10/08/1960 Referring Provider (PT): Landis Martins, [place]   Encounter Date: 11/24/2018  PT End of Session - 11/24/18 1456    Visit Number  9    Number of Visits  13    Date for PT Re-Evaluation  12/18/18    Authorization Type  MCR    PT Start Time  5573    PT Stop Time  2202    PT Time Calculation (min)  48 min    Activity Tolerance  Patient tolerated treatment well    Behavior During Therapy  Agitated;Restless;Anxious       Past Medical History:  Diagnosis Date  . ADHD (attention deficit hyperactivity disorder)   . Allergy   . Anxiety   . Complication of anesthesia    "to ether anesthesia when I was 58 years old, woke up with N/V"  . GERD (gastroesophageal reflux disease)   . Head injury    post-concusive syndrome  . Headache   . Hypertension   . OCD (obsessive compulsive disorder)   . PTSD (post-traumatic stress disorder)   . Seizures ([place])    s/p TBI  . Subdural hematoma ([place]) sept 2012    Past Surgical History:  Procedure Laterality Date  . ABDOMINAL HYSTERECTOMY    . COSMETIC SURGERY  1975   forehead  . PLANTAR FASCIA RELEASE Right 05/22/2017   Procedure: excision PLANTAR fibromas;  Surgeon: Edrick Kins, DPM;  Location: [facility];  Service: Podiatry;  Laterality: Right;    There were no vitals filed for this visit.  Subjective Assessment - 11/24/18 1457    Subjective  Does not want to start on bike today. It is stinging right in the bottom of the thing in my foot when I weight bare. First MTP is burning and stinging when I stand on it since.     Patient Stated Goals  be able to walk without AD, decrease pain, improve balance- shower, bike    Currently in Pain?  Yes    Pain Score  6     Pain Location  Foot    Pain  Orientation  Right    Pain Descriptors / Indicators  --   stinging   Aggravating Factors   weight     Pain Relieving Factors  walk on side of foot                       [facility] Adult PT Treatment/Exercise - 11/24/18 0001      Knee/Hip Exercises: Stretches   Gastroc Stretch  Right;2 reps;30 seconds   long sitting with strap     Knee/Hip Exercises: Seated   Other Seated Knee/Hip Exercises  long sitting PF green tband x20    Other Seated Knee/Hip Exercises  long sitting eversion      Ankle Exercises: Seated   Other Seated Ankle Exercises  weight shifting foot on dynadisk A/P & neutral to medial    Other Seated Ankle Exercises  wobble board (circle)anterior press for midline      Ankle Exercises: Standing   Other Standing Ankle Exercises  green therapad shift weight to Rt center heel             PT Education - 11/24/18 1725    Education Details  ionto protocols,

## 2018-11-24 NOTE — Patient Outreach (Signed)
[facility]) Care Management  11/24/2018  Joy Barber 1961/03/30 [id number]   Unsuccessful outreach regarding social work referral for transportation assistance.  BSW left voicemail message.  Will attempt to reach again within four business days.  Ronn Melena, BSW Social Worker [phone]

## 2018-11-25 ENCOUNTER — Encounter: Payer: Medicare Other | Admitting: Physical Therapy

## 2018-11-27 ENCOUNTER — Ambulatory Visit: Payer: Self-pay

## 2018-11-27 ENCOUNTER — Other Ambulatory Visit: Payer: Self-pay

## 2018-11-27 NOTE — Patient Outreach (Signed)
[facility] [facility]) Care Management  11/27/2018  Joy Barber 1960/10/08 [id number]   Second unsuccessful outreach regarding social work referral for transportation assistance.  Message saying "person unavailable. Unable to leave voicemail message.  Will attempt to reach again within four business days.  Ronn Melena, BSW Social Worker [phone]

## 2018-12-01 ENCOUNTER — Ambulatory Visit: Payer: Medicare Other | Admitting: Physical Therapy

## 2018-12-01 ENCOUNTER — Other Ambulatory Visit: Payer: Self-pay

## 2018-12-01 ENCOUNTER — Ambulatory Visit: Payer: Self-pay

## 2018-12-01 ENCOUNTER — Encounter: Payer: Self-pay | Admitting: Physical Therapy

## 2018-12-01 DIAGNOSIS — Z9181 History of falling: Secondary | ICD-10-CM

## 2018-12-01 DIAGNOSIS — M79671 Pain in right foot: Secondary | ICD-10-CM

## 2018-12-01 DIAGNOSIS — M6281 Muscle weakness (generalized): Secondary | ICD-10-CM

## 2018-12-01 DIAGNOSIS — M779 Enthesopathy, unspecified: Secondary | ICD-10-CM

## 2018-12-01 DIAGNOSIS — R262 Difficulty in walking, not elsewhere classified: Secondary | ICD-10-CM

## 2018-12-01 NOTE — Therapy (Signed)
Fax:  [phone]  Name: Joy Barber MRN: [id number] Date of Birth: 05-02-1961  [place], [place], [address] Phone: [phone]   Fax:  [phone]  Physical Therapy Treatment  Patient Details  Name: Joy Barber MRN: [id number] Date of Birth: 1961-03-09 Referring Provider (PT): Landis Martins, [place]   Encounter Date: 12/01/2018  PT End of Session - 12/01/18 1335    Visit Number  10    Number of Visits  13    Date for PT Re-Evaluation  12/18/18    Authorization Type  MCR    PT Start Time  1330    PT Stop Time  1414    PT Time Calculation (min)  44 min    Activity Tolerance  Patient tolerated treatment well    Behavior During Therapy  Agitated;Restless;Anxious       Past Medical History:  Diagnosis Date  . ADHD (attention deficit hyperactivity disorder)   . Allergy   . Anxiety   . Complication of anesthesia    "to ether anesthesia when I was 58 years old, woke up with N/V"  . GERD (gastroesophageal reflux disease)   . Head injury    post-concusive syndrome  . Headache   . Hypertension   . OCD (obsessive compulsive disorder)   . PTSD (post-traumatic stress disorder)   . Seizures ([place])    s/p TBI  . Subdural hematoma ([place]) sept 2012    Past Surgical History:  Procedure Laterality Date  . ABDOMINAL HYSTERECTOMY    . COSMETIC SURGERY  58   forehead  . PLANTAR FASCIA RELEASE Right 05/22/2017   Procedure: excision PLANTAR fibromas;  Surgeon: Edrick Kins, DPM;  Location: [facility];  Service: Podiatry;  Laterality: Right;    There were no vitals filed for this visit.  Subjective Assessment - 12/01/18 1335    Subjective  Have been working on walking on center of heel. Still hurts when putting pressure at surgical site.     Patient Stated Goals  be able to walk without AD, decrease pain, improve balance- shower, bike    Currently in Pain?  Yes    Pain Score  5     Pain Location  Foot    Pain Orientation  Right    Pain Descriptors / Indicators  Burning    Aggravating  Factors   putting weight on it    Pain Relieving Factors  keep weight off                       [facility] Adult PT Treatment/Exercise - 12/01/18 0001      Manual Therapy   Manual therapy comments  self scar mob- ice massage      Ankle Exercises: Seated   Other Seated Ankle Exercises  rocker board a/p    Other Seated Ankle Exercises  rolling on pink ball pressure through mid foot      Ankle Exercises: Standing   Other Standing Ankle Exercises  weight shifting into Rt foot- airex, floor on towel    Other Standing Ankle Exercises  tandem weight shift Rt foot back               PT Short Term Goals - 07/01/18 1200      PT SHORT TERM GOAL #1   Title  pt will be able to demo HEP without requiring cues for form    Baseline  minimal compliance    Status  On-going      PT SHORT TERM GOAL #2  [place], [place], [address] Phone: [phone]   Fax:  [phone]  Physical Therapy Treatment  Patient Details  Name: Joy Barber MRN: [id number] Date of Birth: 1961-03-09 Referring Provider (PT): Landis Martins, [place]   Encounter Date: 12/01/2018  PT End of Session - 12/01/18 1335    Visit Number  10    Number of Visits  13    Date for PT Re-Evaluation  12/18/18    Authorization Type  MCR    PT Start Time  1330    PT Stop Time  1414    PT Time Calculation (min)  44 min    Activity Tolerance  Patient tolerated treatment well    Behavior During Therapy  Agitated;Restless;Anxious       Past Medical History:  Diagnosis Date  . ADHD (attention deficit hyperactivity disorder)   . Allergy   . Anxiety   . Complication of anesthesia    "to ether anesthesia when I was 58 years old, woke up with N/V"  . GERD (gastroesophageal reflux disease)   . Head injury    post-concusive syndrome  . Headache   . Hypertension   . OCD (obsessive compulsive disorder)   . PTSD (post-traumatic stress disorder)   . Seizures ([place])    s/p TBI  . Subdural hematoma ([place]) sept 2012    Past Surgical History:  Procedure Laterality Date  . ABDOMINAL HYSTERECTOMY    . COSMETIC SURGERY  58   forehead  . PLANTAR FASCIA RELEASE Right 05/22/2017   Procedure: excision PLANTAR fibromas;  Surgeon: Edrick Kins, DPM;  Location: [facility];  Service: Podiatry;  Laterality: Right;    There were no vitals filed for this visit.  Subjective Assessment - 12/01/18 1335    Subjective  Have been working on walking on center of heel. Still hurts when putting pressure at surgical site.     Patient Stated Goals  be able to walk without AD, decrease pain, improve balance- shower, bike    Currently in Pain?  Yes    Pain Score  5     Pain Location  Foot    Pain Orientation  Right    Pain Descriptors / Indicators  Burning    Aggravating  Factors   putting weight on it    Pain Relieving Factors  keep weight off                       [facility] Adult PT Treatment/Exercise - 12/01/18 0001      Manual Therapy   Manual therapy comments  self scar mob- ice massage      Ankle Exercises: Seated   Other Seated Ankle Exercises  rocker board a/p    Other Seated Ankle Exercises  rolling on pink ball pressure through mid foot      Ankle Exercises: Standing   Other Standing Ankle Exercises  weight shifting into Rt foot- airex, floor on towel    Other Standing Ankle Exercises  tandem weight shift Rt foot back               PT Short Term Goals - 07/01/18 1200      PT SHORT TERM GOAL #1   Title  pt will be able to demo HEP without requiring cues for form    Baseline  minimal compliance    Status  On-going      PT SHORT TERM GOAL #2

## 2018-12-01 NOTE — Patient Outreach (Signed)
[place] [facility]) Care Management  12/01/2018  VERNIA TEEM 04-30-1961 [id number]   Successful outreach to patient regarding social work referral for transportation resources.  BSW explained reason for referral and purpose of call.  Patient said that she did not want to speak to BSW because she already has a Education officer, museum and because she was getting around on the bus independently until recently.  Patient then hung up the phone.  BSW closing case.  Ronn Melena, BSW Social Worker [phone]

## 2018-12-02 ENCOUNTER — Encounter: Payer: Medicare Other | Admitting: Physical Therapy

## 2018-12-04 ENCOUNTER — Telehealth: Payer: Self-pay | Admitting: Licensed Clinical Social Worker

## 2018-12-04 NOTE — Telephone Encounter (Signed)
Pt informed LCSWA of scheduled appointment for December 29, 2018 with PCP. No additional concerns noted.

## 2018-12-04 NOTE — Telephone Encounter (Signed)
Incoming call received from patient. Pt reports having to reschedule appointment with FNP [name] due to transportation barriers. Pt wanted to inquire if LCSWA will be available to meet on same day due to [employer] informing patient that PCP will not approve recording visit.   LCSWA assured pt that she will accompany her at visit with PCP and verified that FNP Lavell Anchors is seeing patients at [place] at [facility]. LCSWA discussed the importance of scheduling an appointment to discuss medication refills (Pt will run out of blood pressure medications this week and is taking halves of buspar to make them last) The importance of medication compliance was discussed. No additional concerns noted.

## 2018-12-08 ENCOUNTER — Ambulatory Visit: Payer: Medicare Other | Admitting: Family Medicine

## 2018-12-08 ENCOUNTER — Ambulatory Visit: Payer: Medicare Other | Admitting: Sports Medicine

## 2018-12-16 ENCOUNTER — Ambulatory Visit: Payer: Medicare Other | Admitting: Physical Therapy

## 2018-12-22 ENCOUNTER — Ambulatory Visit: Payer: Medicare Other | Admitting: Physical Therapy

## 2018-12-23 ENCOUNTER — Encounter: Payer: Medicare Other | Admitting: Physical Therapy

## 2018-12-28 ENCOUNTER — Telehealth (HOSPITAL_COMMUNITY): Payer: Self-pay | Admitting: Physical Therapy

## 2018-12-28 NOTE — Telephone Encounter (Signed)
Left message on mobile device stating we would contact her later in the week to schedule an in-person appointment.  Sharrie Self C. Izumi Mixon PT, DPT 12/28/18 12:32 PM

## 2018-12-29 ENCOUNTER — Ambulatory Visit: Payer: Medicare Other | Admitting: Physical Therapy

## 2018-12-29 ENCOUNTER — Ambulatory Visit: Payer: Medicare Other | Admitting: Family Medicine

## 2018-12-30 ENCOUNTER — Encounter: Payer: Medicare Other | Admitting: Physical Therapy

## 2019-01-05 ENCOUNTER — Encounter: Payer: Medicare Other | Admitting: Physical Therapy

## 2019-01-12 ENCOUNTER — Telehealth: Payer: Self-pay | Admitting: Sports Medicine

## 2019-01-12 DIAGNOSIS — Z9889 Other specified postprocedural states: Secondary | ICD-10-CM

## 2019-01-12 NOTE — Telephone Encounter (Signed)
Patient is wanting to know why her physical therapy is asking for another referral, If she needs another one could you just send the referral over without her coming in to our office.

## 2019-01-12 NOTE — Telephone Encounter (Signed)
Yes we can re-send referral to PT without seeing her. After a certain amount of time referral expire and require a new one to be sent.   Val please send new referral, Patient sees Selinda Eon  with [facility]. Send Rx for gait training, strengthening, range of motion and other modailities as needed.

## 2019-01-13 NOTE — Telephone Encounter (Signed)
Pt called upset stating that she doesn't understand why she is having so many issues with the physical therapist office. The physical therapist office called her wanting to get her scheduled to come in to an appt soon and then told her that she needed to get a revised referral from Dr. Cannon Kettle. The revised referral was sent in and now when the pt called the office they are telling her it will be 6 weeks before they can see her for any therapy. Pt was told to continue doing some of the stretches/movements on her own at home but she cannot remember all of the different stretches/movements.

## 2019-01-13 NOTE — Telephone Encounter (Signed)
We are so sorry that she is having this issue. I will message Selinda Eon her therapist to see if she can help go over some of the stretches with her over the phone and re-assure her that she will be seen when they can get her scheduled. Thanks Dr. Chauncey Cruel

## 2019-01-13 NOTE — Addendum Note (Signed)
Addended by: Harriett Sine D on: 01/13/2019 10:18 AM   Modules accepted: Orders

## 2019-01-13 NOTE — Telephone Encounter (Signed)
Faxed referral, and demographics to [facility].

## 2019-01-18 ENCOUNTER — Telehealth: Payer: Self-pay | Admitting: Physical Therapy

## 2019-01-18 NOTE — Telephone Encounter (Signed)
Called patient to talk through PT stretches over the phone. States that she is unable to properly process what I would be saying and will just continue to do the best she can at the exercises she can remember. Pt notified that we have the necessary referral and we are hoping to begin scheduling again around June 1st but to note that may change.   Yoniel Arkwright C. Dyshawn Cangelosi PT, DPT 01/18/19 9:53 AM

## 2019-01-19 ENCOUNTER — Ambulatory Visit: Payer: Medicare Other | Admitting: Sports Medicine

## 2019-01-20 NOTE — Telephone Encounter (Signed)
Message sent to provider 

## 2019-01-25 ENCOUNTER — Telehealth: Payer: Self-pay | Admitting: Physical Therapy

## 2019-01-25 NOTE — Telephone Encounter (Signed)
Returning pt call- left voicemail with phone number. Tarez Bowns C. Gaberial Cada PT, DPT 01/25/19 1:16 PM

## 2019-02-16 ENCOUNTER — Ambulatory Visit: Payer: Medicare Other | Admitting: Sports Medicine

## 2019-02-16 ENCOUNTER — Telehealth: Payer: Self-pay | Admitting: Sports Medicine

## 2019-02-16 DIAGNOSIS — L905 Scar conditions and fibrosis of skin: Secondary | ICD-10-CM

## 2019-02-16 DIAGNOSIS — R52 Pain, unspecified: Secondary | ICD-10-CM

## 2019-02-16 MED ORDER — DICLOFENAC EPOLAMINE 1.3 % TD PTCH: 1.0000 | MEDICATED_PATCH | Freq: Two times a day (BID) | TRANSDERMAL | 5 refills | Status: AC

## 2019-02-16 MED FILL — DICLOFENAC EPOLAMINE 1.3 %: 1.3 | 15 days supply | Qty: 30 | Fill #0

## 2019-02-16 NOTE — Addendum Note (Signed)
Addended by: Harriett Sine D on: 02/16/2019 09:59 AM   Modules accepted: Orders

## 2019-02-16 NOTE — Telephone Encounter (Signed)
I called pt and informed that Dr. Cannon Kettle had refilled the diclofenac patch with +5refills.

## 2019-02-16 NOTE — Telephone Encounter (Signed)
Patient called to cancel appt for today due to the warnings on the news regarding staying home for COVID-19. Pt would like to know if we can go ahead and send in a refill for her patches or does she need to come in for another appt?

## 2019-02-17 ENCOUNTER — Telehealth: Payer: Self-pay | Admitting: Physical Therapy

## 2019-02-17 NOTE — Telephone Encounter (Signed)
Left message stating that I was contacting her to schedule appointment and I will call her again tomorrow.  Edson Deridder C. Rosaura Bolon PT, DPT 02/17/19 4:48 PM

## 2019-02-23 ENCOUNTER — Telehealth: Payer: Self-pay | Admitting: Physical Therapy

## 2019-02-23 NOTE — Telephone Encounter (Signed)
Spoke with patient- she would like to RS her eval to 6/16 from 6/9 as she needs to go to her bank prior to beginning PT but is unable to because they are currently closed due to COVID-19.  Kambria Grima C. Kaiah Hosea PT, DPT 02/23/19 9:17 AM

## 2019-03-02 ENCOUNTER — Ambulatory Visit: Payer: Medicare Other | Admitting: Physical Therapy

## 2019-03-09 ENCOUNTER — Other Ambulatory Visit: Payer: Self-pay

## 2019-03-09 ENCOUNTER — Encounter: Payer: Self-pay | Admitting: Physical Therapy

## 2019-03-09 ENCOUNTER — Ambulatory Visit: Payer: Medicare Other | Attending: Sports Medicine | Admitting: Physical Therapy

## 2019-03-09 DIAGNOSIS — M6281 Muscle weakness (generalized): Secondary | ICD-10-CM | POA: Insufficient documentation

## 2019-03-09 DIAGNOSIS — M79671 Pain in right foot: Secondary | ICD-10-CM | POA: Diagnosis not present

## 2019-03-09 DIAGNOSIS — M779 Enthesopathy, unspecified: Secondary | ICD-10-CM | POA: Diagnosis present

## 2019-03-09 DIAGNOSIS — R262 Difficulty in walking, not elsewhere classified: Secondary | ICD-10-CM | POA: Diagnosis present

## 2019-03-09 NOTE — Patient Instructions (Signed)
Physical Therapy 03/09/2019- Home exercises:  1. Red band point and flex 2. Hold on to back of chair, Right leg extensions to back. 3. Stand up straight with partial weight on Right foot- holding on to back of chair.  a. Start with 3 minutes and increase as tolerated Perform the exercises at least twice per day.

## 2019-03-09 NOTE — Therapy (Signed)
Baseline  unable at eval, pain 7-8/10    Time  6    Period  Weeks    Target Date  04/20/19            Plan - 03/09/19 1452    Clinical Impression Statement  Pt presents to PT for re-evaluation of pain on plantar aspect of right foot. Has lost ROM since ast visit as she states she was unable to remember  exercises and cannot process printouts. We agreed that she can tolerate up to 3 exercises each week to work on at home and a word document was used for pt to specify wording that will help her remember them. We discussed that the bottom of her foot is going to hurt when placing weight through it but that does not mean it is being damaged and learning to walk is going to hurt the bottom of her foot. Pt verbalized understanding. At the end of 6 weeks I expect that patient will be able to ambulate within the clinic without AD. I advised that she will not feel back to "100%" and will likely still feel that she will have deficits but that ambulation without AD is the goal of this 6 weeks. Advised that she may still want to push rollator or another cart as she carries a few bags when she leaves her home. Pt verbalized understanding and recorded our session.    PT Treatment/Interventions  ADLs/Self Care Home Management;Cryotherapy;Moist Heat;Therapeutic exercise;Therapeutic activities;Functional mobility training;Stair training;Gait training;Ultrasound;DME Instruction;Balance training;Neuromuscular re-education;Patient/family education;Manual techniques;Taping;Passive range of motion;Iontophoresis 4mg /ml Dexamethasone;Electrical Stimulation    PT Next Visit Plan  roll over foot, toe exercises, hip abduction    PT Home Exercise Plan  resisted PF red tband, standing hip ext, weight shift    Consulted and Agree with Plan of Care  Patient       Patient will benefit from skilled therapeutic intervention in order to improve the following deficits and impairments:  Abnormal gait, Decreased endurance, Impaired sensation, Decreased activity tolerance, Decreased strength, Pain, Difficulty walking, Decreased mobility, Decreased balance, Decreased range of motion, Improper body mechanics, Impaired flexibility, Decreased coordination  Visit Diagnosis: 1. Pain in right foot   2. Difficulty in walking, not elsewhere  classified        Problem List Patient Active Problem List   Diagnosis Date Noted  . ADHD (attention deficit hyperactivity disorder) 06/11/2013  . PTSD (post-traumatic stress disorder) 06/11/2013  . History of seizures 06/11/2013  . TBI (traumatic brain injury) ([place]) 06/11/2013   Deondria Puryear C. Kabella Cassidy PT, DPT 03/09/19 3:02 PM   [facility] [address] [place], [place], [address] Phone: [phone]   Fax:  [phone]  Name: AYRA HODGDON MRN: [id number] Date of Birth: [birth year]  [place], [place], [address] Phone: [phone]   Fax:  [phone]  Physical Therapy Treatment/Re-evaluation  Patient Details  Name: Joy Barber MRN: [id number] Date of Birth: 02/22/1953 Referring Provider (PT): Landis Martins, [place]   Encounter Date: 03/09/2019  PT End of Session - 03/09/19 1404    Visit Number  11    Number of Visits  17    Date for PT Re-Evaluation  04/20/19    PT Start Time  1400    PT Stop Time  1443    PT Time Calculation (min)  43 min    Activity Tolerance  Patient tolerated treatment well    Behavior During Therapy  Anxious;Restless       Past Medical History:  Diagnosis Date  . ADHD (attention deficit hyperactivity disorder)   . Allergy   . Anxiety   . Complication of anesthesia    "to ether anesthesia when I was 58 years old, woke up with N/V"  . GERD (gastroesophageal reflux disease)   . Head injury    post-concusive syndrome  . Headache   . Hypertension   . OCD (obsessive compulsive disorder)   . PTSD (post-traumatic stress disorder)   . Seizures ([place])    s/p TBI  . Subdural hematoma ([place]) sept 2012    Past Surgical History:  Procedure Laterality Date  . ABDOMINAL HYSTERECTOMY    . COSMETIC SURGERY  1975   forehead  . PLANTAR FASCIA RELEASE Right 05/22/2017   Procedure: excision PLANTAR fibromas;  Surgeon: Edrick Kins, DPM;  Location: [place];  Service: Podiatry;  Laterality: Right;    There were no vitals filed for this visit.  Subjective Assessment - 03/09/19 1405    Subjective  Foot will start throbbing and stinging without reason. I am having problems with my right knee as well. Swelling under Rt achilles tendon.    Patient Stated Goals  be able to walk without AD, decrease pain, improve balance- shower, bike    Currently in Pain?  Yes    Pain Score  3     Pain Location  Foot    Pain Orientation  Right   plantar   Pain Descriptors / Indicators   Throbbing    Aggravating Factors   putting weight through foot    Pain Relieving Factors  take weight off         [facility] PT Assessment - 03/09/19 0001      Assessment   Medical Diagnosis  post-operative state, Rt foot    Referring Provider (PT)  Landis Martins, DPM    Onset Date/Surgical Date  05/22/18    Hand Dominance  Right    Prior Therapy  yes      Precautions   Precautions  None      Restrictions   Weight Bearing Restrictions  No      Balance Screen   Has the patient fallen in the past 6 months  No      Home Environment   Additional Comments  hotel room      Prior Function   Level of Independence  Independent;Independent with household mobility with device;Independent with basic ADLs;Independent with community mobility with device      Cognition   Overall Cognitive Status  History of cognitive impairments - at baseline      Observation/Other Assessments   Focus on Therapeutic Outcomes (FOTO)   69% limitation      Observation/Other Assessments-[place]  [place], [place], [address] Phone: [phone]   Fax:  [phone]  Physical Therapy Treatment/Re-evaluation  Patient Details  Name: Joy Barber MRN: [id number] Date of Birth: 02/22/1953 Referring Provider (PT): Landis Martins, [place]   Encounter Date: 03/09/2019  PT End of Session - 03/09/19 1404    Visit Number  11    Number of Visits  17    Date for PT Re-Evaluation  04/20/19    PT Start Time  1400    PT Stop Time  1443    PT Time Calculation (min)  43 min    Activity Tolerance  Patient tolerated treatment well    Behavior During Therapy  Anxious;Restless       Past Medical History:  Diagnosis Date  . ADHD (attention deficit hyperactivity disorder)   . Allergy   . Anxiety   . Complication of anesthesia    "to ether anesthesia when I was 58 years old, woke up with N/V"  . GERD (gastroesophageal reflux disease)   . Head injury    post-concusive syndrome  . Headache   . Hypertension   . OCD (obsessive compulsive disorder)   . PTSD (post-traumatic stress disorder)   . Seizures ([place])    s/p TBI  . Subdural hematoma ([place]) sept 2012    Past Surgical History:  Procedure Laterality Date  . ABDOMINAL HYSTERECTOMY    . COSMETIC SURGERY  1975   forehead  . PLANTAR FASCIA RELEASE Right 05/22/2017   Procedure: excision PLANTAR fibromas;  Surgeon: Edrick Kins, DPM;  Location: [facility];  Service: Podiatry;  Laterality: Right;    There were no vitals filed for this visit.  Subjective Assessment - 03/09/19 1405    Subjective  Foot will start throbbing and stinging without reason. I am having problems with my right knee as well. Swelling under Rt achilles tendon.    Patient Stated Goals  be able to walk without AD, decrease pain, improve balance- shower, bike    Currently in Pain?  Yes    Pain Score  3     Pain Location  Foot    Pain Orientation  Right   plantar   Pain Descriptors / Indicators   Throbbing    Aggravating Factors   putting weight through foot    Pain Relieving Factors  take weight off         [facility] PT Assessment - 03/09/19 0001      Assessment   Medical Diagnosis  post-operative state, Rt foot    Referring Provider (PT)  Landis Martins, DPM    Onset Date/Surgical Date  05/22/18    Hand Dominance  Right    Prior Therapy  yes      Precautions   Precautions  None      Restrictions   Weight Bearing Restrictions  No      Balance Screen   Has the patient fallen in the past 6 months  No      Home Environment   Additional Comments  hotel room      Prior Function   Level of Independence  Independent;Independent with household mobility with device;Independent with basic ADLs;Independent with community mobility with device      Cognition   Overall Cognitive Status  History of cognitive impairments - at baseline      Observation/Other Assessments   Focus on Therapeutic Outcomes (FOTO)   69% limitation      Observation/Other Assessments-[place]

## 2019-03-10 ENCOUNTER — Telehealth: Payer: Self-pay | Admitting: General Practice

## 2019-03-10 ENCOUNTER — Telehealth: Payer: Self-pay

## 2019-03-10 NOTE — Telephone Encounter (Signed)
Patient states she would like to get resources and that it is not a rush to get back to her. She would like to get  Information on how to find out what is on her credit report. Please follow up.

## 2019-03-10 NOTE — Telephone Encounter (Signed)
Call returned to patient # [phone]  regarding a question about her credit score, message left requesting a call back to this CM # [phone]

## 2019-03-12 NOTE — Telephone Encounter (Signed)
Patient called back to get her records and to inquire about her financials. Please follow up.

## 2019-03-15 ENCOUNTER — Telehealth: Payer: Self-pay

## 2019-03-15 NOTE — Telephone Encounter (Addendum)
Call received from patient. She explained that she is " overwhelmed" and needed to cancel her appointment with Dr Chapman Fitch this week.    She said that she went to the bank to address some business and was told that her credit score remained high despite the student loans being removed from the credit report. This was very upsetting to her as she is trying to secure a line of credit.    She then spoke about not trusting people as they " lie " to her. This was in regards to " family" /friends speaking about where she is living. She continues to reside in an extended stay motel. She said that she stays in her room by herself, avoiding others because she stated she doesn't trust people.   She said that at the recommendation of her PT, she plans to purchase a stationary bike for $200 with plans to continue her rehab program in efforts to ambulate with a cane in 6 weeks.   She said that she would like copies of her medical record. She understands that she needs to come to [facility] to sign a release to obtain these records and they would only be records from [facility].  She said that she is particularly interested in notes from Christa See, [place].  She also wanted to confirm her insurance that is noted in [place]. She has [employer] Medicare Advantage Choice PPO - policy # [id number]. Informed her that she needs to bring this card with her to the clinic so a copy can be scanned into her record.  She also confirmed her medicaid # [id number] which is only [place] medicaid. Informed her that there is a copy of her medicare card in Epic.   She said that she knows she needs to get back on her medications and will need to schedule an appointment to establish care but right now she is too overwhelmed

## 2019-03-15 NOTE — Telephone Encounter (Signed)
Call returned to patient # [phone]  regarding a question about her records and financials, message left requesting a call back to this CM # [phone]

## 2019-03-15 NOTE — Telephone Encounter (Signed)
It appears that Molli Barrows has been patient's primary care provider. You may want to ask DJ about the notes from [facility] because usually a medical record release involves notes from the primary care provider. I am not sure if mental health notes fall into a separate category or protected category.

## 2019-03-16 ENCOUNTER — Other Ambulatory Visit: Payer: Self-pay

## 2019-03-16 ENCOUNTER — Telehealth: Payer: Self-pay

## 2019-03-16 ENCOUNTER — Ambulatory Visit: Payer: Medicare Other | Admitting: Physical Therapy

## 2019-03-16 ENCOUNTER — Encounter: Payer: Self-pay | Admitting: Physical Therapy

## 2019-03-16 DIAGNOSIS — M79671 Pain in right foot: Secondary | ICD-10-CM

## 2019-03-16 DIAGNOSIS — R262 Difficulty in walking, not elsewhere classified: Secondary | ICD-10-CM

## 2019-03-16 DIAGNOSIS — M779 Enthesopathy, unspecified: Secondary | ICD-10-CM

## 2019-03-16 DIAGNOSIS — M6281 Muscle weakness (generalized): Secondary | ICD-10-CM

## 2019-03-16 NOTE — Telephone Encounter (Signed)
Call received from patient. She wanted to clarify that this CM will call her when it is determined how she can obtain her medical records from [facility] that includes appointment notes from [facility] [place].  Informed her that the practice administrator is checking on the process for release of SW records and this CM will contact her with the information. Reminded her that notes from Ms [name] would not be released because she was working at [facility] at the time of the appointment, not [facility]. The patient stated that she understood.  The patient also stated that she was reviewing a hospital discharge summary from a few years ago.  She noted that she was on keppra at the time.  Reminded her that she needs to schedule an appointment with a PCP to establish care and reconcile meds. She said that she understood.  She also spoke about her frustrations with medicaid and communication with provider offices. Informed her that as per [employer], her medicaid is not active.  The patient said that she is tired of being referred to specialty providers because they accept medicaid and then she is told she doesn't have medicaid.  Informed her that this clinic is using Epic for financial information and outside providers may have a different system. She will need to verify her insurance with each provider that she sees,

## 2019-03-16 NOTE — Therapy (Signed)
[place] MRN: [id number] Date of Birth: 27-Jun-1961  [place] MRN: [id number] Date of Birth: 27-Jun-1961  [place], [place], [address] Phone: [phone]   Fax:  [phone]  Physical Therapy Treatment  Patient Details  Name: Joy Barber MRN: [id number] Date of Birth: [birth year] Referring Provider (PT): Landis Martins, [place]   Encounter Date: 03/16/2019  PT End of Session - 03/16/19 1422    Visit Number  12    Number of Visits  17    Date for PT Re-Evaluation  04/20/19    Authorization Type  MCR    PT Start Time  1355    PT Stop Time  1443    PT Time Calculation (min)  48 min    Activity Tolerance  Patient tolerated treatment well    Behavior During Therapy  Restless;Agitated       Past Medical History:  Diagnosis Date  . ADHD (attention deficit hyperactivity disorder)   . Allergy   . Anxiety   . Complication of anesthesia    "to ether anesthesia when I was 58 years old, woke up with N/V"  . GERD (gastroesophageal reflux disease)   . Head injury    post-concusive syndrome  . Headache   . Hypertension   . OCD (obsessive compulsive disorder)   . PTSD (post-traumatic stress disorder)   . Seizures ([place])    s/p TBI  . Subdural hematoma ([place]) sept 2012    Past Surgical History:  Procedure Laterality Date  . ABDOMINAL HYSTERECTOMY    . COSMETIC SURGERY  1975   forehead  . PLANTAR FASCIA RELEASE Right 05/22/2017   Procedure: excision PLANTAR fibromas;  Surgeon: Edrick Kins, DPM;  Location: [place];  Service: Podiatry;  Laterality: Right;    There were no vitals filed for this visit.      [facility] PT Assessment - 03/16/19 0001      Observation/Other Assessments   Focus on Therapeutic Outcomes (FOTO)   --   requested to be removed from system     Sensation   Additional Comments  able to tolerate 70 of 165lb in SLS on scale                   [facility] Adult PT Treatment/Exercise - 03/16/19 0001      Knee/Hip Exercises: Aerobic   Recumbent Bike  5 min L2   end of session L1 7  months     Knee/Hip Exercises: Standing   Functional Squat Limitations  squat to chair pull off sink    Other Standing Knee Exercises  tandem weight shift    Other Standing Knee Exercises  weight shift onto scale as tolerated      Knee/Hip Exercises: Supine   Bridges  15 reps      Ankle Exercises: Seated   Other Seated Ankle Exercises  blue tband resisted PF      Ankle Exercises: Standing   Rocker Board  Other (comment)   lateral static & dynamic   Other Standing Ankle Exercises  AIREX weight shift, small tandem weight shift, marching               PT Short Term Goals - 07/01/18 1200      PT SHORT TERM GOAL #1   Title  pt will be able to demo HEP without requiring cues for form    Baseline  minimal compliance    Status  On-going      PT SHORT TERM GOAL #2   Title  pt will be able to lay the

## 2019-03-16 NOTE — Patient Instructions (Signed)
Physical therapy homework 03/16/2019 1. On your back, glut lift 2. Hang on to sink, squat 3. Left foot forward, Right foot back (4th position)- rocking back and forth.   Ms Vidaurri will benefit from a stationary bike for exercise. Selinda Eon PT, DPT [facility]

## 2019-03-16 NOTE — Telephone Encounter (Signed)
Call placed to patient and informed her that the practice administrator will need to confirm a process/procedure for release of SW records. Patient will be contacted to advise her of the process before she comes to the clinic to sign a release.  She verbalized understanding.

## 2019-03-17 ENCOUNTER — Ambulatory Visit: Payer: Medicaid Other | Admitting: Family Medicine

## 2019-03-18 ENCOUNTER — Telehealth: Payer: Self-pay

## 2019-03-18 NOTE — Telephone Encounter (Signed)
Call placed to patient with Joy See, LCSW present for the call.  [name] explained to the patient that she can summarize the services she has provided for the patient and print on letterhead.  She also explained that the patient can obtain the records of the visits with [facility] and her Intern, [employer]. She will need to sign a specific release of information.  The patient stated that she would like both a copy of the records as well as the summary letter. [name] stated that she would prepare the letter and release of information document for the patient.   The patient was also reminded that she needs to establish care with a PCP as she is in need of medication. She said that she understands.  She explained that she needs to have a PCP that will allow her to audio record the appointments.

## 2019-03-19 ENCOUNTER — Telehealth: Payer: Self-pay | Admitting: Licensed Clinical Social Worker

## 2019-03-19 NOTE — Telephone Encounter (Signed)
Outgoing call placed to patient with RN CM, Eden Lathe. Pt shared interest in obtaining medical records. LCSW discussed process of requesting records and informed her that it may take up to 30 days. Pt verbalized understanding. LCSW agreed to provide a summary of services provided on letterhead.   Pt would like to establish care with a PCP; however, prefers to audio record appointments. LCSW will discuss pt's request to record with Provider and follow up with patient on 03/22/2019. No additional concerns noted.

## 2019-03-22 ENCOUNTER — Telehealth: Payer: Self-pay | Admitting: Licensed Clinical Social Worker

## 2019-03-22 NOTE — Telephone Encounter (Signed)
Follow up call placed to patient. Pt reports isolating self from others due to mistrust and shared that she is not "thinking straight" LCSW inquired about medication compliance. Pt stated that she ran out of gabapentin in February and buspar approximately two weeks ago. LCSW discussed importance of establishing care with PCP, pt verbalized understanding.   Pt is currently participating in physical therapy weekly. Pt requested to pick up medical release forms (Patient Request for Access Form and Disclosure of [place]) at [facility] on 03/23/2019. She will follow up with LCSW in a week to discuss establishing care with a PCP. No additional concerns noted.

## 2019-03-23 ENCOUNTER — Encounter: Payer: Self-pay | Admitting: Physical Therapy

## 2019-03-23 ENCOUNTER — Other Ambulatory Visit: Payer: Self-pay

## 2019-03-23 ENCOUNTER — Ambulatory Visit: Payer: Medicare Other | Admitting: Physical Therapy

## 2019-03-23 DIAGNOSIS — R262 Difficulty in walking, not elsewhere classified: Secondary | ICD-10-CM

## 2019-03-23 DIAGNOSIS — M779 Enthesopathy, unspecified: Secondary | ICD-10-CM

## 2019-03-23 DIAGNOSIS — M6281 Muscle weakness (generalized): Secondary | ICD-10-CM

## 2019-03-23 DIAGNOSIS — M79671 Pain in right foot: Secondary | ICD-10-CM | POA: Diagnosis not present

## 2019-03-23 NOTE — Therapy (Addendum)
[name] [address] [place], [place], [address] Phone: [phone]   Fax:  [phone]  Physical Therapy Treatment/Discharge  Patient Details  Name: Joy Barber MRN: [id number] Date of Birth: 29-Nov-1960 Referring Provider (PT): Asencion Islam, [place]   Encounter Date: 03/23/2019  PT End of Session - 03/23/19 1403    Visit Number  13    Number of Visits  17    Date for PT Re-Evaluation  04/20/19    Authorization Type  MCR    PT Start Time  1400    PT Stop Time  1442    PT Time Calculation (min)  42 min    Activity Tolerance  Patient tolerated treatment well    Behavior During Therapy  [facility] for tasks assessed/performed       Past Medical History:  Diagnosis Date  . ADHD (attention deficit hyperactivity disorder)   . Allergy   . Anxiety   . Complication of anesthesia    "to ether anesthesia when I was 58 years old, woke up with N/V"  . GERD (gastroesophageal reflux disease)   . Head injury    post-concusive syndrome  . Headache   . Hypertension   . OCD (obsessive compulsive disorder)   . PTSD (post-traumatic stress disorder)   . Seizures (HCC)    s/p TBI  . Subdural hematoma (HCC) sept 2012    Past Surgical History:  Procedure Laterality Date  . ABDOMINAL HYSTERECTOMY    . COSMETIC SURGERY  1975   forehead  . PLANTAR FASCIA RELEASE Right 05/22/2017   Procedure: excision PLANTAR fibromas;  Surgeon: Felecia Shelling, DPM;  Location: [facility] OR;  Service: Podiatry;  Laterality: Right;    There were no vitals filed for this visit.  Subjective Assessment - 03/23/19 1402    Subjective  No pain right now but I smoked before I came.    Patient Stated Goals  be able to walk without AD, decrease pain, improve balance- shower, bike    Currently in Pain?  No/denies         [facility] PT Assessment - 03/23/19 0001      Ambulation/Gait   Gait Comments  decreased hip extension at toe off but was able to demo roll over toes  in parallel bars                   [facility] Adult PT Treatment/Exercise - 03/23/19 0001      Knee/Hip Exercises: Aerobic   Nustep  10 min L5      Knee/Hip Exercises: Standing   Gait Training  in parallel bars- hip ext, toe off    Other Standing Knee Exercises  retro step and return, step onto 4" step lunge fwd & return               PT Short Term Goals - 07/01/18 1200      PT SHORT TERM GOAL #1   Title  pt will be able to demo HEP without requiring cues for form    Baseline  minimal compliance    Status  On-going      PT SHORT TERM GOAL #2   Title  pt will be able to lay the plantar surface of her foot on the floor without weight bearing    Baseline  able    Status  Achieved        PT Long Term Goals - 03/09/19 1415      PT LONG TERM  GOAL #1   Title  Pt will demo DF to at least 5 deg for required ROM during ambulation    Baseline  0 deg    Time  6    Period  Weeks    Target Date  04/20/19      PT LONG TERM GOAL #2   Title  Pt will be able to balance enough to feel safe taking a shower on an acceptable basis for her    Baseline  I don't put my foot on the floor, use chair and lean on walls    Time  6    Period  Weeks    Target Date  04/20/19      PT LONG TERM GOAL #3   Title  pt will be able to utilize both feet on the ground for ambulation with SPC to move around her home    Baseline  sometimes uses a cane with pressure on heel, uses rollator in community    Time  6    Period  Weeks    Target Date  04/20/19      PT LONG TERM GOAL #4   Title  pt will demo 100 ft of ambulation without AD in clinic    Baseline  unable at eval, pain 7-8/10    Time  6    Period  Weeks    Target Date  04/20/19            Plan - 03/23/19 1431    Clinical Impression Statement  Pt did well in parallel bars in gait pattern. Experienced burning while in standing but not on nustep with resistance. Asked her to switch cane to Left hand.    PT  Treatment/Interventions  ADLs/Self Care Home Management;Cryotherapy;Moist Heat;Therapeutic exercise;Therapeutic activities;Functional mobility training;Stair training;Gait training;Ultrasound;DME Instruction;Balance training;Neuromuscular re-education;Patient/family education;Manual techniques;Taping;Passive range of motion;Iontophoresis 4mg /ml Dexamethasone;Electrical Stimulation    PT Next Visit Plan  continue in // bars    PT Home Exercise Plan  bridges, retro step and return, ambulate with cane in left hand    Consulted and Agree with Plan of Care  Patient       Patient will benefit from skilled therapeutic intervention in order to improve the following deficits and impairments:  Abnormal gait, Decreased endurance, Impaired sensation, Decreased activity tolerance, Decreased strength, Pain, Difficulty walking, Decreased mobility, Decreased balance, Decreased range of motion, Improper body mechanics, Impaired flexibility, Decreased coordination  Visit Diagnosis: 1. Pain in right foot   2. Difficulty in walking, not elsewhere classified   3. Tendinitis   4. Muscle weakness (generalized)        Problem List Patient Active Problem List   Diagnosis Date Noted  . ADHD (attention deficit hyperactivity disorder) 06/11/2013  . PTSD (post-traumatic stress disorder) 06/11/2013  . History of seizures 06/11/2013  . TBI (traumatic brain injury) (HCC) 06/11/2013   Ambera Fedele C. Jordany Russett PT, DPT 03/23/19 2:52 PM   [facility] [address] [place], [place], [address] Phone: [phone]   Fax:  [phone]  Name: Joy Barber MRN: [id number] Date of Birth: [birth year]  PHYSICAL THERAPY DISCHARGE SUMMARY  Visits from Start of Care: 13  Current functional level related to goals / functional outcomes: See above   Remaining deficits: See above   Education / Equipment: Anatomy of condition, POC, HEP, exercise form/rationale   Plan: Patient agrees to discharge.  Patient goals were not met. Patient is being discharged due to not returning since the last visit.  ?????  D/C per attendance policy.   Dimitrios Balestrieri C. Dontavis Tschantz PT, DPT 05/13/19 3:28 PM

## 2019-03-23 NOTE — Patient Instructions (Signed)
Physical therapy 03/23/2019 1. Walking forward, cane in Left hand. Concentrate on toe up with back foot. 2. Hip lift laying on bed.  3. Feet together, step back toe-heel and back to first position.

## 2019-03-30 ENCOUNTER — Ambulatory Visit: Payer: Medicare Other | Admitting: Physical Therapy

## 2019-04-06 ENCOUNTER — Ambulatory Visit: Payer: Medicare Other | Admitting: Physical Therapy

## 2019-04-06 ENCOUNTER — Telehealth: Payer: Self-pay | Admitting: Physical Therapy

## 2019-04-06 NOTE — Telephone Encounter (Signed)
Returned pt call- she states she is having blurred vision and discomfort in Right eye so she was not at her last 2 appointments. Concerned that the last 4 appointments were not submitted to [facility] and requests to speak with [facility].  Waddell Iten C. Kolbie Clarkston PT, DPT 04/06/19 5:23 PM

## 2019-04-09 ENCOUNTER — Telehealth: Payer: Self-pay | Admitting: Sports Medicine

## 2019-04-09 NOTE — Telephone Encounter (Signed)
Pt requested you give her call next week on Monday or Tuesday.

## 2019-04-09 NOTE — Telephone Encounter (Signed)
Ok

## 2019-04-12 ENCOUNTER — Telehealth: Payer: Self-pay | Admitting: Sports Medicine

## 2019-04-12 ENCOUNTER — Telehealth: Payer: Self-pay

## 2019-04-12 NOTE — Telephone Encounter (Signed)
Patient called wanting to speak with you. Patient states she would like to speak with someone in regards to billing. Please follow up.
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
## 2019-04-12 NOTE — Telephone Encounter (Signed)
-----   Message from Landis Martins, [place] sent at 04/09/2019  4:59 PM EDT ----- Regarding: Call Call

## 2019-04-13 ENCOUNTER — Ambulatory Visit: Payer: Medicare Other | Admitting: Physical Therapy

## 2019-04-19 NOTE — Telephone Encounter (Signed)
Follow up call placed to patient. Pt shared concerns that medicaid is listed in her chart. Pt confirmed that she receives Medicare only. Pt voiced frustration and reports that she does not trust the health system. Phone call was abruptly disconnected. LCSW left message for a return call, should pt have any additional questions or concerns.

## 2019-04-20 ENCOUNTER — Ambulatory Visit: Payer: Medicare Other | Admitting: Physical Therapy

## 2019-04-21 ENCOUNTER — Telehealth: Payer: Self-pay | Admitting: Physical Therapy

## 2019-04-21 NOTE — Telephone Encounter (Signed)
Left VM requesting call back from patient. Benicio Manna C. Miette Molenda PT, DPT 04/21/19 10:26 AM

## 2019-04-23 ENCOUNTER — Telehealth: Payer: Self-pay | Admitting: Physical Therapy

## 2019-04-23 NOTE — Telephone Encounter (Signed)
Left VM for patient returning call. Advised I will be out of the office until Tuesday.  Sahan Pen C. Anniebell Bedore PT, DPT 04/23/19 11:13 AM

## 2019-04-29 ENCOUNTER — Telehealth: Payer: Self-pay | Admitting: Physical Therapy
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
## 2019-05-05 ENCOUNTER — Telehealth: Payer: Self-pay | Admitting: Physical Therapy

## 2019-05-05 NOTE — Telephone Encounter (Signed)
Left message requesting call back from patient. Joy Barber C. Joy Barber PT, DPT 05/05/19 2:30 PM

## 2019-05-11 ENCOUNTER — Telehealth: Payer: Self-pay | Admitting: Physical Therapy

## 2019-05-11 NOTE — Telephone Encounter (Signed)
Left message in attempt to contact patient. Renee Erb C. Jaydynn Wolford PT, DPT 05/11/19 5:32 PM

## 2019-05-13 ENCOUNTER — Telehealth: Payer: Self-pay | Admitting: Physical Therapy

## 2019-05-13 NOTE — Telephone Encounter (Signed)
Left message advising that I have attempted to contact her multiple times and I am letting her know that she will be d/c from my care. We discussed this in a previous conversation and pt asked that I let her know before doing anything.  Carleton Van[place]burgh C. Rishaan Gunner PT, DPT 05/13/19 3:27 PM

## 2019-05-20 ENCOUNTER — Telehealth: Payer: Self-pay | Admitting: Family Medicine

## 2019-05-20 NOTE — Telephone Encounter (Signed)
Patient called stating she needs to speak to you about her identity and issues going on with her. Patient states she has been attempting to reach you but unfortunately has not been able to get in contact. Please follow up.
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
## 2019-05-24 ENCOUNTER — Institutional Professional Consult (permissible substitution): Payer: Medicare Other | Admitting: Licensed Clinical Social Worker

## 2019-06-07 ENCOUNTER — Ambulatory Visit: Payer: Medicare Other

## 2019-06-07 ENCOUNTER — Telehealth: Payer: Self-pay | Admitting: *Deleted

## 2019-06-07 ENCOUNTER — Telehealth: Payer: Self-pay | Admitting: General Practice

## 2019-06-07 NOTE — Telephone Encounter (Signed)
Pt called states she has some questions and is recording the phone call due to her head injury.

## 2019-06-07 NOTE — Telephone Encounter (Signed)
Ok thank you. Usually she calls just to vent or to let me know what's going on. I can further discuss with her any concerns when she comes to office on 10/13 -Dr. Cannon Kettle

## 2019-06-07 NOTE — Telephone Encounter (Signed)
Patient called to verify why she was scheduled an appointment at [facility] please follow up.

## 2019-06-07 NOTE — Telephone Encounter (Signed)
I called pt and she explained that she wanted Dr. Cannon Kettle to know that she cancelled her appt with PCP someone scheduled but not her and cancelled her appt with DR. Cannon Kettle because she wanted to get all of her medical notes, charges and appts together and she didn't want any telephone appts with anyone she didn't know, and was not through with PT, but still trying to get her records together. Pt states Dr. Luna Kitchens of [facility] an obstetric doctor called her for an appt and she's had a hysterectomy, and she needs a Education officer, museum, and has blurred vision and headaches and doesn't remember the appts on 16, 23, 30, and doesn't know any Lorelle Formosa lady. Pt asked if she goes through all of her clinical notes, 7-8 books is there anyway to store that she could keep on her. I told pt that her clinicals were always in our computer system if she needed them and she stated she doesn't have a computer. Pt states she just wanted Dr. Cannon Kettle to know she was trying to get her information straight before she came in, and asked if we needed pictures of her her feet and I told her that Dr. Cannon Kettle would take pictures or x-rays as needed and I would inform Dr. Cannon Kettle of her concerns. Pt thanked me.

## 2019-06-08 ENCOUNTER — Ambulatory Visit: Payer: Medicare Other | Admitting: Sports Medicine

## 2019-06-09 ENCOUNTER — Telehealth: Payer: Self-pay | Admitting: *Deleted

## 2019-06-09 NOTE — Telephone Encounter (Signed)
Pt states she will not be able to come in this week to get the records, but will complete the form and bring in.

## 2019-06-11 NOTE — Telephone Encounter (Signed)
Return call placed to patient. LCSW discussed reason appointment was scheduled (establish care with PCP and re-initiate medication management) however, pt does not feel it is an appropriate time to make appointment due to feelings of overwhelm. Pt's priority at this time is her foot. Support and encouragement was provided. LCSW encouraged pt to contact her with any questions or concerns that may arise.

## 2019-07-06 ENCOUNTER — Ambulatory Visit: Payer: Medicare Other | Admitting: Sports Medicine

## 2019-08-11 ENCOUNTER — Telehealth: Payer: Self-pay | Admitting: Sports Medicine

## 2019-08-11 NOTE — Telephone Encounter (Signed)
Pt called stating she is having pain and cracking in her heel and would like to know if soaking her foot will help.

## 2019-08-11 NOTE — Telephone Encounter (Signed)
Thanks

## 2019-08-11 NOTE — Telephone Encounter (Addendum)
I called pt and she took the time to put the recorder on her phone and she started to talk about her information being compromised, and a gynecologist calling and she doesn't even have a uterus. Pt states she didn't wait for my call and soaked the heel in hot water and epsom salt and now the heel doesn't look like a heel it is red, swollen and looks ulcerated. I told pt that over soaking a heel caused the skin to absorb too much water and then dried out and crack. Pt interrupts states she doesn't have a problem with cracked skin this is new. Pt states she has put neosporin and covered and used the antibiotic scrub. I asked pt again if the area looked like an ulcer and she states yes. I told her in my medical opinion she should come in to be evaluated. Pt refused "I am not going to do that", stating she has an appt 08/24/2019 and has to wait on rides and doesn't take the bus anymore. Pt states she is suppose to have a Education officer, museum and Assunta Found said she needs one and Dr. Cannon Kettle had filled out paperwork for one and she asked how to get in touch with a Education officer, museum. I told pt that I honestly had never had to contact a Education officer, museum for a pt, but had once called [facility] reception and asked how to and they transferred to the ED and they sent me to the social worker line and I left a message. Pt wanted to know if the last time she called 07/02/2019 was noted in the clinicals. I reviewed and told her I didn't see it, only the last time in had talked to her 06/09/2019. Pt states she called 07/02/2019 to set up the 08/24/2019 appt. I opened the 08/24/2019, 07/02/2019 was listed as the date pt called to schedule and I informed pt. I spent 14 minutes with pt.

## 2019-08-23 ENCOUNTER — Telehealth: Payer: Self-pay | Admitting: Sports Medicine

## 2019-08-23 NOTE — Telephone Encounter (Signed)
Ok thanks. You can also offer a virtual/telephone visit next time -Dr. Cannon Kettle
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
## 2019-08-24 ENCOUNTER — Ambulatory Visit: Payer: Medicare Other | Admitting: Sports Medicine

## 2019-09-28 ENCOUNTER — Ambulatory Visit: Payer: Medicare Other | Admitting: Sports Medicine

## 2019-09-28 ENCOUNTER — Telehealth: Payer: Self-pay | Admitting: Sports Medicine

## 2019-09-28 NOTE — Telephone Encounter (Signed)
Pt called requesting her medical records for review but wanted to let the doctor know she is planning to come back for her follow up appts once she gets the records and is able to review them.

## 2019-10-08 ENCOUNTER — Telehealth: Payer: Self-pay | Admitting: Sports Medicine

## 2019-10-08 NOTE — Telephone Encounter (Signed)
ok 

## 2019-10-08 NOTE — Telephone Encounter (Signed)
Dr. Cannon Kettle, pt is requesting you call her one day next week. She is coming one day to pick up all of her medical records and then will contact our office to schedule another appointment with you. She did not specify a date or time for you to call her next week.

## 2019-10-11 ENCOUNTER — Telehealth: Payer: Self-pay | Admitting: Sports Medicine

## 2019-10-11 NOTE — Telephone Encounter (Signed)
Patient called back returning voicemail, stating she is sorry she missed the call. Please give patient a call back tomorrow.

## 2019-10-11 NOTE — Telephone Encounter (Signed)
Ok thanks I will try to call her back again on tomorrow -Dr. Chauncey Cruel

## 2019-10-11 NOTE — Telephone Encounter (Signed)
-----   Message from Landis Martins, [place] sent at 10/08/2019  3:00 PM EST ----- Call

## 2019-10-11 NOTE — Telephone Encounter (Signed)
Returned patient call. Patient did not answer. Left a voicemail with instructions for patient to call office for any questions or concerns that she has. -Dr. Cannon Kettle

## 2019-10-12 ENCOUNTER — Telehealth: Payer: Self-pay | Admitting: Sports Medicine

## 2019-10-12 DIAGNOSIS — L905 Scar conditions and fibrosis of skin: Secondary | ICD-10-CM

## 2019-10-12 DIAGNOSIS — Z87820 Personal history of traumatic brain injury: Secondary | ICD-10-CM

## 2019-10-12 DIAGNOSIS — M722 Plantar fascial fibromatosis: Secondary | ICD-10-CM

## 2019-10-12 DIAGNOSIS — Z9889 Other specified postprocedural states: Secondary | ICD-10-CM

## 2019-10-12 NOTE — Telephone Encounter (Signed)
Return phone call to patient was made to patient, reports that she has not been to office due to [place] and documentation of PT appt, reports that there was changes to her appts and stopped going because of her concern. Reports that she discussed with her brother and will plan on staying in until March. Reports that she is using Slim cycle/nutrisystem. Reports that she has a wart on left that she wants off. Reports that she is also having pain at her left at the knot in arch that looks like a fibroma. Patient reports that she is still using pain patches. I encouraged patient to continue with current care and we can re-discuss wart, fibroma, and we will re-discuss PT when she comes in March for follow up. -Dr. Cannon Kettle

## 2020-01-11 ENCOUNTER — Telehealth: Payer: Self-pay | Admitting: Sports Medicine
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
## 2020-01-18 ENCOUNTER — Ambulatory Visit: Payer: Medicare Other | Admitting: Sports Medicine

## 2020-02-15 ENCOUNTER — Encounter: Payer: Self-pay | Admitting: Sports Medicine

## 2020-02-15 ENCOUNTER — Ambulatory Visit (INDEPENDENT_AMBULATORY_CARE_PROVIDER_SITE_OTHER): Payer: Medicare Other | Admitting: Sports Medicine

## 2020-02-15 ENCOUNTER — Other Ambulatory Visit: Payer: Self-pay

## 2020-02-15 DIAGNOSIS — R52 Pain, unspecified: Secondary | ICD-10-CM

## 2020-02-15 DIAGNOSIS — M79671 Pain in right foot: Secondary | ICD-10-CM | POA: Diagnosis not present

## 2020-02-15 DIAGNOSIS — L905 Scar conditions and fibrosis of skin: Secondary | ICD-10-CM | POA: Diagnosis not present

## 2020-02-15 DIAGNOSIS — M722 Plantar fascial fibromatosis: Secondary | ICD-10-CM

## 2020-02-15 DIAGNOSIS — Q828 Other specified congenital malformations of skin: Secondary | ICD-10-CM

## 2020-02-15 DIAGNOSIS — M79672 Pain in left foot: Secondary | ICD-10-CM

## 2020-02-15 NOTE — Progress Notes (Signed)
Subjective:  Joy Barber is a 59 y.o. female patient who returns to office for follow up evaluation of foot pain. Reports that the "wart" areas are really painful and has been using Equate lotion and vaseline to area and has been trying to file them but hurts really bad especially on left. Reports that she still has pain at right arch at surgical area especially when she steps down has a sharp pain to the area. Reports that she is still using her pain patches to both feet at areas that hurt and wearing her brace on the left. Patient reports multiple concerns at this visit including billing concerns and documentation concerns.  No other issues at this time.   Patient Active Problem List   Diagnosis Date Noted  . ADHD (attention deficit hyperactivity disorder) 06/11/2013  . PTSD (post-traumatic stress disorder) 06/11/2013  . History of seizures 06/11/2013  . TBI (traumatic brain injury) ([place]) 06/11/2013    Current Outpatient Medications on File Prior to Visit  Medication Sig Dispense Refill  . Ascorbic Acid (VITAMIN C PO) Take 1 tablet by mouth daily.    . busPIRone (BUSPAR) 15 MG tablet Take 2 tablets (30 mg total) by mouth 2 (two) times daily. 120 tablet 0  . diclofenac (FLECTOR) 1.3 % PTCH Place 1 patch onto the skin 2 (two) times daily. 30 patch 5  . diphenhydramine-acetaminophen (TYLENOL PM) 25-500 MG TABS tablet Take 1 tablet by mouth every morning.    Marland Kitchen doxylamine, Sleep, (UNISOM) 25 MG tablet Take 25 mg by mouth at bedtime as needed.    . gabapentin (NEURONTIN) 300 MG capsule Take 1 capsule (300 mg total) by mouth 3 (three) times daily. 90 capsule 0  . hydrochlorothiazide (HYDRODIURIL) 25 MG tablet Take 1 tablet (25 mg total) by mouth daily. (Patient taking differently: Take 25 mg by mouth daily as needed (for edema). ) 90 tablet 3  . hydrOXYzine (VISTARIL) 50 MG capsule TAKE 2 CAPSULES BY MOUTH 3 TIMES DAILY AS NEEDED FOR ANXIETY. (Patient not taking: Reported on 11/18/2018) 90 capsule  2  . Multiple Vitamins-Minerals (AIRBORNE PO) Take by mouth.    Marland Kitchen NIFEdipine (PROCARDIA-XL/ADALAT-CC/NIFEDICAL-XL) 30 MG 24 hr tablet TAKE 1 TABLET BY MOUTH ONCE DAILY 90 tablet 0  . NON FORMULARY Piracet 800 mg tablets (memory aid): Take 1 tablet by mouth once a day or as otherwise instructed    . Omega-3 Fatty Acids (FISH OIL PO) Take 1 capsule by mouth daily.    . sodium chloride (OCEAN) 0.65 % SOLN nasal spray Place 1-2 sprays into both nostrils 3 (three) times daily as needed for congestion.     Current Facility-Administered Medications on File Prior to Visit  Medication Dose Route Frequency Provider Last Rate Last Admin  . betamethasone acetate-betamethasone sodium phosphate (CELESTONE) injection 3 mg  3 mg Intramuscular Once Edrick Kins, DPM        Allergies  Allergen Reactions  . Cephalosporins Itching  . Nsaids Other (See Comments)    History of subdural hematoma  . Penicillins Itching and Other (See Comments)    Welts (also) Has patient had a PCN reaction causing immediate rash, facial/tongue/throat swelling, SOB or lightheadedness with hypotension: Yes Has patient had a PCN reaction causing severe rash involving mucus membranes or skin necrosis: No Has patient had a PCN reaction that required hospitalization: Yes Has patient had a PCN reaction occurring within the last 10 years: No If all of the above answers are "NO", then may proceed with Cephalosporin  use.     Objective:  General: Alert and oriented x3 in no acute distress with moments of frustrations regarding other concerns  Dermatology: + thick scar/keloid formation at plantar right foot that is sensitive to touch as previous.  There is a small raised soft tissue mass along the plantar fashion on the left arch consistent with fibroma   Plantar forefoot calluses bilateral with nucleated core. No signs of infection.   Vascular: Dorsalis Pedis and Posterior Tibial pedal pulses intact, Temperature gradient within  normal limits.  Neurology: Gross sensation intact via light touch bilateral. Sharp pain in right arch.  Musculoskeletal: + tenderness to bilateral forefoot and R>L foot at arch and 1st toe joint, Range of motion within normal limits with mild guarding due to pain, waring ankle brace on left  Gait: Antalgic gait rolling walker assisted  Assessment and Plan: Problem List Items Addressed This Visit    None    Visit Diagnoses    Porokeratosis    -  Primary   Painful scar       Plantar fascial fibromatosis       Foot pain, bilateral          -Complete examination performed -Mechanically debrided callus plantar forefoot bilateral x 4 using sterile 15 blade and then applied salinocaine and bandaid dressing and advised patient to keep intact today and then on tomorrow may remove and use foot miracle cream to areas -Applied offloading padding to current orthotics and advised patient that these are pressure calluses and not warts -Dispensed foot miracle cream and advised patient to use a small (pea-size) amount to callus areas and to let me know next visit how this has helped -Continue with Flector patches bilateral and OTC PRN meds as needed for pain -[facility] at [facility] for scar management as well as bilateral fibromas with dry needling -Continue with good supportive shoes and advised patient that after PT works with her then we will eventually discuss new shoes and orthotics but for now lets see if the padding helps -Patient to return to office in 1 month or sooner if condition worsens.  Landis Martins, DPM

## 2020-02-17 ENCOUNTER — Telehealth: Payer: Self-pay | Admitting: *Deleted

## 2020-02-17 DIAGNOSIS — R52 Pain, unspecified: Secondary | ICD-10-CM

## 2020-02-17 DIAGNOSIS — M722 Plantar fascial fibromatosis: Secondary | ICD-10-CM

## 2020-02-17 NOTE — Telephone Encounter (Signed)
-----   Message from Landis Martins, [place] sent at 02/15/2020  7:55 PM EDT ----- Regarding: PT at [facility] PT with treatment and dry needling Plantar fibromas bilateral Chronic right foot and left ankle pain

## 2020-02-17 NOTE — Telephone Encounter (Signed)
Delivered to [facility]. 

## 2020-02-25 ENCOUNTER — Telehealth: Payer: Self-pay | Admitting: Sports Medicine

## 2020-02-25 NOTE — Telephone Encounter (Signed)
Pt called and wanted Dr. Cannon Kettle to please call her before her therapy appt Tuesday  W/[facility]  shes having some confusion and wanted to know why her feet are so white from where stover worked on the bottom of her feet

## 2020-02-28 NOTE — Telephone Encounter (Signed)
FYI Returned patient phone call. Patient reports that she has some Feet concerns pain to lateral side of both feet, I explained that her pain on lateral side of both feet is likely from the way she walks and stand, rubbing on 5th met base. I advised her that she may need new orthotics but recommend her to start PT first. Patient also states that she has concerns about her PT and meeting Lynn Ito, patient will be coming at 10 am tomorrow and I advised her that I will discuss with Lynn Ito and that we will plan for an assessment tomorrow by PT. Patient also concerned with her medical records and the information that is not correct in her chart. I advised patient that we should focus on getting her started with PT 1st and then we can follow up the concerns that she has about her medical records at a later time. -Dr. Chauncey Cruel

## 2020-02-28 NOTE — Telephone Encounter (Signed)
Pt called and wanted to know if the calluses being white and painful and wanted to know if it would keep from doing pt pt stated she forgot to mention to Dr.Stover in there conversation on 02/28/20

## 2020-03-06 ENCOUNTER — Telehealth: Payer: Self-pay | Admitting: Sports Medicine

## 2020-03-06 NOTE — Telephone Encounter (Signed)
Patient called requesting a call back from Dr. Cannon Kettle.

## 2020-03-07 NOTE — Telephone Encounter (Signed)
FYI I returned patient phone call. She reports that she got information from [facility] on last week. She listened to the recording from our last conversation and thought that PT last week was going to be a meet and greet and has the Rx. I advised patient when she comes to office we can discuss PT and her moving forward with dry needling. Advised patient that we will move forward with PT at her pace. She also reports that she has 3 phone # for [facility] and gets confused, I advised patient to continue to use phone # that she gets someone on and not the others. She reports that she is trying to get a Neuropsych consult  and has called a place that was really medication management. I advised her to talk with Joycelyn Schmid again regarding this or her brother who is a doctor in [place] about recommendations or for her to do research on the proper locations for her. Patient reminded me that she is coming in next week. I advised patient that I will see her then to talk about her foot pain and PT. -Dr. Chauncey Cruel

## 2020-03-13 ENCOUNTER — Telehealth: Payer: Self-pay | Admitting: Sports Medicine

## 2020-03-13 NOTE — Telephone Encounter (Signed)
Called patient back few concerns:  1. Paper reciepts with different transaction #s and talked with Ria Comment said that receipts are billing of which she is address lateral 2. PT on tomorrow and patient wants to see me prior to going to her visit and I told her that I can say "hi" to her before she goes to therapy then she will see me at 115 pm for her appointment and then after she can speak to vicki in billing about the different addresses and transaction #s on her receipts -Dr. Cannon Kettle

## 2020-03-13 NOTE — Telephone Encounter (Signed)
Please give patient a call today. Pt has some questions to review

## 2020-03-14 ENCOUNTER — Other Ambulatory Visit: Payer: Self-pay

## 2020-03-14 ENCOUNTER — Ambulatory Visit (INDEPENDENT_AMBULATORY_CARE_PROVIDER_SITE_OTHER): Payer: Medicare Other | Admitting: Sports Medicine

## 2020-03-14 ENCOUNTER — Encounter: Payer: Self-pay | Admitting: Sports Medicine

## 2020-03-14 DIAGNOSIS — M722 Plantar fascial fibromatosis: Secondary | ICD-10-CM | POA: Diagnosis not present

## 2020-03-14 DIAGNOSIS — Z87820 Personal history of traumatic brain injury: Secondary | ICD-10-CM | POA: Diagnosis not present

## 2020-03-14 DIAGNOSIS — R269 Unspecified abnormalities of gait and mobility: Secondary | ICD-10-CM | POA: Diagnosis not present

## 2020-03-14 DIAGNOSIS — M79671 Pain in right foot: Secondary | ICD-10-CM | POA: Diagnosis not present

## 2020-03-14 DIAGNOSIS — M25671 Stiffness of right ankle, not elsewhere classified: Secondary | ICD-10-CM | POA: Diagnosis not present

## 2020-03-14 DIAGNOSIS — L905 Scar conditions and fibrosis of skin: Secondary | ICD-10-CM

## 2020-03-14 DIAGNOSIS — M79672 Pain in left foot: Secondary | ICD-10-CM

## 2020-03-14 DIAGNOSIS — R52 Pain, unspecified: Secondary | ICD-10-CM

## 2020-03-14 DIAGNOSIS — Q828 Other specified congenital malformations of skin: Secondary | ICD-10-CM

## 2020-03-14 DIAGNOSIS — M25672 Stiffness of left ankle, not elsewhere classified: Secondary | ICD-10-CM | POA: Diagnosis not present

## 2020-03-14 NOTE — Progress Notes (Signed)
Subjective:  ALAIYAH BOLLMAN is a 59 y.o. female patient who returns to office for follow up evaluation of foot pain.  Patient is a little frazzled today because my medical assistant brought her back and closed the door in the room and she was uncomfortable with this action.  I was able to talk with the patient and calm her down to let her know that my assistant was a Adult nurse.  Patient constantly kept referring to that experience however patient was able to be attentive and record during this visit for me to provide care.  Patient reports that her visit with physical therapy went well this morning and that she will be seen twice a week and at first they will work with physical therapy and then slowly if patient needs to add on dry needling.  Patient reports that she still has soreness at the callus areas and is also concerned about the changes at her right great toenail.  Patient also reports that she is worried about her son and has new people living at the hotel and because of this she has been carrying around extra bags with her belongings and it because she is afraid to leave it at the hotel where she lives because she cannot trust the new people that have moved in.  Patient Active Problem List   Diagnosis Date Noted   ADHD (attention deficit hyperactivity disorder) 06/11/2013   PTSD (post-traumatic stress disorder) 06/11/2013   History of seizures 06/11/2013   TBI (traumatic brain injury) ([place]) 06/11/2013    Current Outpatient Medications on File Prior to Visit  Medication Sig Dispense Refill   Ascorbic Acid (VITAMIN C PO) Take 1 tablet by mouth daily.     busPIRone (BUSPAR) 15 MG tablet Take 2 tablets (30 mg total) by mouth 2 (two) times daily. 120 tablet 0   diclofenac (FLECTOR) 1.3 % PTCH Place 1 patch onto the skin 2 (two) times daily. 30 patch 5   diphenhydramine-acetaminophen (TYLENOL PM) 25-500 MG TABS tablet Take 1 tablet by mouth every morning.     doxylamine, Sleep,  (UNISOM) 25 MG tablet Take 25 mg by mouth at bedtime as needed.     gabapentin (NEURONTIN) 300 MG capsule Take 1 capsule (300 mg total) by mouth 3 (three) times daily. 90 capsule 0   hydrochlorothiazide (HYDRODIURIL) 25 MG tablet Take 1 tablet (25 mg total) by mouth daily. (Patient taking differently: Take 25 mg by mouth daily as needed (for edema). ) 90 tablet 3   hydrOXYzine (VISTARIL) 50 MG capsule TAKE 2 CAPSULES BY MOUTH 3 TIMES DAILY AS NEEDED FOR ANXIETY. 90 capsule 2   Multiple Vitamins-Minerals (AIRBORNE PO) Take by mouth.     NIFEdipine (PROCARDIA-XL/ADALAT-CC/NIFEDICAL-XL) 30 MG 24 hr tablet TAKE 1 TABLET BY MOUTH ONCE DAILY 90 tablet 0   NON FORMULARY Piracet 800 mg tablets (memory aid): Take 1 tablet by mouth once a day or as otherwise instructed     Omega-3 Fatty Acids (FISH OIL PO) Take 1 capsule by mouth daily.     sodium chloride (OCEAN) 0.65 % SOLN nasal spray Place 1-2 sprays into both nostrils 3 (three) times daily as needed for congestion.     Current Facility-Administered Medications on File Prior to Visit  Medication Dose Route Frequency Provider Last Rate Last Admin   betamethasone acetate-betamethasone sodium phosphate (CELESTONE) injection 3 mg  3 mg Intramuscular Once Edrick Kins, DPM        Allergies  Allergen Reactions   Cephalosporins  Itching   Nsaids Other (See Comments)    History of subdural hematoma   Penicillins Itching and Other (See Comments)    Welts (also) Has patient had a PCN reaction causing immediate rash, facial/tongue/throat swelling, SOB or lightheadedness with hypotension: Yes Has patient had a PCN reaction causing severe rash involving mucus membranes or skin necrosis: No Has patient had a PCN reaction that required hospitalization: Yes Has patient had a PCN reaction occurring within the last 10 years: No If all of the above answers are "NO", then may proceed with Cephalosporin use.     Objective:  General: Alert and  oriented x3 in no acute distress with moments of frustrations and emotions regarding above in the subjective portion of this note  Dermatology: + thick scar/keloid formation at plantar right foot with decreased sensitivity there is a small raised soft tissue mass along the plantar fashion on the left arch consistent with fibroma with taut plantar fascial is noted bilateral.  Plantar forefoot calluses bilateral with nucleated core. No signs of infection.   Right hallux and second toenails thickened with subungual debris likely consistent with fungus versus traumatic nail changes with no acute surrounding signs of infection.  Vascular: Dorsalis Pedis and Posterior Tibial pedal pulses intact, Temperature gradient within normal limits.  Neurology: Johney Maine sensation intact via light touch bilateral.   Musculoskeletal: + tenderness to bilateral forefoot and R>L foot at arch and 1st toe joint, right greater than left.  Pes cavus foot type.  Gait: Antalgic gait rolling walker assisted  Assessment and Plan: Problem List Items Addressed This Visit    None    Visit Diagnoses    Porokeratosis    -  Primary   Foot pain, bilateral       Plantar fascial fibromatosis       Painful scar       History of traumatic brain injury          -Complete examination performed -Mechanically debrided callus plantar forefoot bilateral x 4 using sterile chisel blade without incident -Advised patient to only soak once weekly may hold off on soaking until Thursday or Friday of this week with warm water and Epson salt however to avoid excessive soaking -Continue with foot miracle cream daily to the callused areas to help keep them from accumulating more dead skin to the area -Continue with current orthotics and advised patient that we will allow physical therapy to continue to work with her to help with her gait and then once this is improved may consider a new set of custom orthotics -Continue with Flector patches  bilateral only on days where she is doing a lot of walking and standing to help with any pain to her feet -Patient to return to office in 1 month or sooner if condition worsens.  Landis Martins, DPM

## 2020-03-17 DIAGNOSIS — M79672 Pain in left foot: Secondary | ICD-10-CM | POA: Diagnosis not present

## 2020-03-17 DIAGNOSIS — M25672 Stiffness of left ankle, not elsewhere classified: Secondary | ICD-10-CM | POA: Diagnosis not present

## 2020-03-17 DIAGNOSIS — R269 Unspecified abnormalities of gait and mobility: Secondary | ICD-10-CM | POA: Diagnosis not present

## 2020-03-17 DIAGNOSIS — M79671 Pain in right foot: Secondary | ICD-10-CM | POA: Diagnosis not present

## 2020-03-17 DIAGNOSIS — M25671 Stiffness of right ankle, not elsewhere classified: Secondary | ICD-10-CM | POA: Diagnosis not present

## 2020-03-20 ENCOUNTER — Telehealth: Payer: Self-pay | Admitting: Sports Medicine

## 2020-03-20 NOTE — Telephone Encounter (Signed)
Pt has called and wanted someone from billing to call her because she states she has billing problems

## 2020-04-11 ENCOUNTER — Ambulatory Visit: Payer: Medicare Other | Admitting: Sports Medicine

## 2020-04-18 ENCOUNTER — Ambulatory Visit: Payer: Medicare Other | Admitting: Sports Medicine

## 2020-04-27 ENCOUNTER — Ambulatory Visit: Payer: Medicare Other | Admitting: Sports Medicine

## 2020-05-04 ENCOUNTER — Ambulatory Visit: Payer: Medicare Other | Admitting: Sports Medicine

## 2020-06-01 ENCOUNTER — Ambulatory Visit: Payer: Medicare Other | Admitting: Sports Medicine

## 2020-06-22 ENCOUNTER — Ambulatory Visit: Payer: Medicare Other | Admitting: Sports Medicine

## 2020-07-13 ENCOUNTER — Ambulatory Visit: Payer: Medicare Other | Admitting: Sports Medicine

## 2020-07-13 ENCOUNTER — Other Ambulatory Visit: Payer: Self-pay

## 2020-07-13 ENCOUNTER — Encounter: Payer: Self-pay | Admitting: Sports Medicine

## 2020-07-13 DIAGNOSIS — M722 Plantar fascial fibromatosis: Secondary | ICD-10-CM | POA: Diagnosis not present

## 2020-07-13 DIAGNOSIS — Z87820 Personal history of traumatic brain injury: Secondary | ICD-10-CM

## 2020-07-13 DIAGNOSIS — R269 Unspecified abnormalities of gait and mobility: Secondary | ICD-10-CM | POA: Diagnosis not present

## 2020-07-13 DIAGNOSIS — R52 Pain, unspecified: Secondary | ICD-10-CM | POA: Diagnosis not present

## 2020-07-13 DIAGNOSIS — M25672 Stiffness of left ankle, not elsewhere classified: Secondary | ICD-10-CM | POA: Diagnosis not present

## 2020-07-13 DIAGNOSIS — M79671 Pain in right foot: Secondary | ICD-10-CM

## 2020-07-13 DIAGNOSIS — M25671 Stiffness of right ankle, not elsewhere classified: Secondary | ICD-10-CM | POA: Diagnosis not present

## 2020-07-13 DIAGNOSIS — L905 Scar conditions and fibrosis of skin: Secondary | ICD-10-CM

## 2020-07-13 DIAGNOSIS — Q828 Other specified congenital malformations of skin: Secondary | ICD-10-CM | POA: Diagnosis not present

## 2020-07-13 DIAGNOSIS — M79672 Pain in left foot: Secondary | ICD-10-CM

## 2020-07-13 NOTE — Progress Notes (Signed)
Subjective:  Joy Barber is a 59 y.o. female patient who returns to office for follow up evaluation of foot pain.  Patient reports that she went for physical therapy today and states that she has been using her topical Flector patches to the bottoms of both feet and soaking which seems to help but there is some pain at the callus areas.  Patient reports that likely she will have 2 more sessions of physical therapy before she is done.  Patient reports that she is a little frazzled today because they took money from her at check-in instead of check out like they have done before.  Patient also reports that she will wait later to get new orthotics.  Patient denies any other pedal complaints at this time.  Patient Active Problem List   Diagnosis Date Noted  . ADHD (attention deficit hyperactivity disorder) 06/11/2013  . PTSD (post-traumatic stress disorder) 06/11/2013  . History of seizures 06/11/2013  . TBI (traumatic brain injury) ([place]) 06/11/2013    Current Outpatient Medications on File Prior to Visit  Medication Sig Dispense Refill  . Ascorbic Acid (VITAMIN C PO) Take 1 tablet by mouth daily.    . busPIRone (BUSPAR) 15 MG tablet Take 2 tablets (30 mg total) by mouth 2 (two) times daily. 120 tablet 0  . diclofenac (FLECTOR) 1.3 % PTCH Place 1 patch onto the skin 2 (two) times daily. 30 patch 5  . diphenhydramine-acetaminophen (TYLENOL PM) 25-500 MG TABS tablet Take 1 tablet by mouth every morning.    Marland Kitchen doxylamine, Sleep, (UNISOM) 25 MG tablet Take 25 mg by mouth at bedtime as needed.    . gabapentin (NEURONTIN) 300 MG capsule Take 1 capsule (300 mg total) by mouth 3 (three) times daily. 90 capsule 0  . hydrochlorothiazide (HYDRODIURIL) 25 MG tablet Take 1 tablet (25 mg total) by mouth daily. (Patient taking differently: Take 25 mg by mouth daily as needed (for edema). ) 90 tablet 3  . hydrOXYzine (VISTARIL) 50 MG capsule TAKE 2 CAPSULES BY MOUTH 3 TIMES DAILY AS NEEDED FOR ANXIETY. 90 capsule  2  . Multiple Vitamins-Minerals (AIRBORNE PO) Take by mouth.    Marland Kitchen NIFEdipine (PROCARDIA-XL/ADALAT-CC/NIFEDICAL-XL) 30 MG 24 hr tablet TAKE 1 TABLET BY MOUTH ONCE DAILY 90 tablet 0  . NON FORMULARY Piracet 800 mg tablets (memory aid): Take 1 tablet by mouth once a day or as otherwise instructed    . Omega-3 Fatty Acids (FISH OIL PO) Take 1 capsule by mouth daily.    . sodium chloride (OCEAN) 0.65 % SOLN nasal spray Place 1-2 sprays into both nostrils 3 (three) times daily as needed for congestion.     Current Facility-Administered Medications on File Prior to Visit  Medication Dose Route Frequency Provider Last Rate Last Admin  . betamethasone acetate-betamethasone sodium phosphate (CELESTONE) injection 3 mg  3 mg Intramuscular Once Edrick Kins, DPM        Allergies  Allergen Reactions  . Cephalosporins Itching  . Nsaids Other (See Comments)    History of subdural hematoma  . Penicillins Itching and Other (See Comments)    Welts (also) Has patient had a PCN reaction causing immediate rash, facial/tongue/throat swelling, SOB or lightheadedness with hypotension: Yes Has patient had a PCN reaction causing severe rash involving mucus membranes or skin necrosis: No Has patient had a PCN reaction that required hospitalization: Yes Has patient had a PCN reaction occurring within the last 10 years: No If all of the above answers are "NO", then  may proceed with Cephalosporin use.     Objective:  General: Alert and oriented x3 in no acute distress with moments of emotions due to TBI  Dermatology: + thick scar/keloid formation at plantar right foot with decreased sensitivity there is a small raised soft tissue mass along the plantar fashion on the left arch consistent with fibroma with taut plantar fascial is noted bilateral that both appear to be improving.  Plantar forefoot calluses bilateral with nucleated core. No signs of infection.   Right hallux and second toenails thickened with  subungual debris likely consistent with fungus versus traumatic nail changes with no acute surrounding signs of infection.  Vascular: Dorsalis Pedis and Posterior Tibial pedal pulses intact, Temperature gradient within normal limits.  Neurology: Johney Maine sensation intact via light touch bilateral.   Musculoskeletal: + tenderness to bilateral forefoot and R>L foot at arch and 1st toe joint, right greater than left.  Pes cavus foot type.  Gait: Antalgic gait rolling walker assisted like previous with multiple bags.  Assessment and Plan:  Problem List Items Addressed This Visit    None    Visit Diagnoses    Porokeratosis    -  Primary   Foot pain, bilateral       Plantar fascial fibromatosis       Painful scar       History of traumatic brain injury          -Complete examination performed -Mechanically debrided callus plantar forefoot bilateral x 4 using sterile chisel blade without incident -Continue with flector patches  -Continue with current orthotics and added additional padding to her current insoles -Continue with PT -Patient to return to office in 4-6 weeks or sooner if condition worsens.  Landis Martins, DPM

## 2020-08-24 ENCOUNTER — Ambulatory Visit: Payer: Medicare Other | Admitting: Sports Medicine

## 2020-09-28 ENCOUNTER — Ambulatory Visit: Payer: Medicare Other | Admitting: Sports Medicine

## 2020-10-26 ENCOUNTER — Ambulatory Visit: Payer: Medicare Other | Admitting: Sports Medicine

## 2020-11-09 ENCOUNTER — Ambulatory Visit: Payer: Medicare Other | Admitting: Sports Medicine

## 2020-11-23 ENCOUNTER — Ambulatory Visit: Payer: Medicare Other | Admitting: Sports Medicine

## 2020-12-21 ENCOUNTER — Ambulatory Visit: Payer: Medicare Other | Admitting: Sports Medicine

## 2020-12-28 ENCOUNTER — Ambulatory Visit: Payer: Medicare Other | Admitting: Sports Medicine

## 2021-01-25 ENCOUNTER — Ambulatory Visit: Payer: Medicare Other | Admitting: Sports Medicine

## 2021-04-19 ENCOUNTER — Ambulatory Visit: Payer: Medicare Other | Admitting: Sports Medicine

## 2021-05-07 NOTE — Telephone Encounter (Signed)
error 

## 2021-05-15 ENCOUNTER — Other Ambulatory Visit: Payer: Self-pay

## 2021-05-17 ENCOUNTER — Ambulatory Visit: Payer: Medicare Other | Admitting: Sports Medicine

## 2021-06-21 ENCOUNTER — Ambulatory Visit: Payer: Medicare Other | Admitting: Sports Medicine

## 2021-07-05 ENCOUNTER — Ambulatory Visit: Payer: Medicare Other | Admitting: Sports Medicine

## 2021-08-09 ENCOUNTER — Ambulatory Visit: Payer: Medicare Other | Admitting: Sports Medicine

## 2021-09-06 ENCOUNTER — Ambulatory Visit: Payer: Medicare Other | Admitting: Sports Medicine

## 2021-09-27 ENCOUNTER — Ambulatory Visit: Payer: Medicare Other | Admitting: Sports Medicine

## 2021-10-11 ENCOUNTER — Ambulatory Visit: Payer: Medicare Other | Admitting: Sports Medicine

## 2021-10-16 ENCOUNTER — Telehealth: Payer: Self-pay | Admitting: Sports Medicine

## 2021-10-16 NOTE — Telephone Encounter (Signed)
Patient called and wanted Dr. Cannon Kettle to know she got some PF orthotics from [facility] and they are working. The only thing  is they are shifting her weight to her heel. Is that normal?

## 2021-11-15 ENCOUNTER — Ambulatory Visit: Payer: Medicare Other | Admitting: Sports Medicine

## 2021-12-13 ENCOUNTER — Ambulatory Visit: Payer: Medicare Other | Admitting: Sports Medicine

## 2022-01-31 ENCOUNTER — Ambulatory Visit: Payer: Medicare Other | Admitting: Sports Medicine

## 2022-02-14 ENCOUNTER — Ambulatory Visit: Payer: Medicare Other | Admitting: Sports Medicine

## 2022-02-14 DIAGNOSIS — Q828 Other specified congenital malformations of skin: Secondary | ICD-10-CM | POA: Diagnosis not present

## 2022-02-14 DIAGNOSIS — M79672 Pain in left foot: Secondary | ICD-10-CM

## 2022-02-14 DIAGNOSIS — R52 Pain, unspecified: Secondary | ICD-10-CM | POA: Diagnosis not present

## 2022-02-14 DIAGNOSIS — M25371 Other instability, right ankle: Secondary | ICD-10-CM

## 2022-02-14 DIAGNOSIS — M722 Plantar fascial fibromatosis: Secondary | ICD-10-CM

## 2022-02-14 DIAGNOSIS — M79671 Pain in right foot: Secondary | ICD-10-CM | POA: Diagnosis not present

## 2022-02-14 DIAGNOSIS — L905 Scar conditions and fibrosis of skin: Secondary | ICD-10-CM

## 2022-02-14 NOTE — Progress Notes (Signed)
Subjective:  Joy Barber is a 61 y.o. female patient who returns to office for follow up evaluation of foot pain.  Patient reports that 1.  She is still having some pain in her right arch at the knot.  Admits a year ago had some active infection but no recent infection has been seen.  States that it still hurts and makes her walk funny 2.  Ankle turning out on the right currently still using brace, 3.  Hard skin to the plantar surfaces of both feet.  Patient denies any other pedal complaints at this time.  Patient Active Problem List   Diagnosis Date Noted   ADHD (attention deficit hyperactivity disorder) 06/11/2013   PTSD (post-traumatic stress disorder) 06/11/2013   History of seizures 06/11/2013   TBI (traumatic brain injury) ([place]) 06/11/2013    Current Outpatient Medications on File Prior to Visit  Medication Sig Dispense Refill   Ascorbic Acid (VITAMIN C PO) Take 1 tablet by mouth daily.     busPIRone (BUSPAR) 15 MG tablet Take 2 tablets (30 mg total) by mouth 2 (two) times daily. 120 tablet 0   diclofenac (FLECTOR) 1.3 % PTCH Place 1 patch onto the skin 2 (two) times daily. 30 patch 5   diphenhydramine-acetaminophen (TYLENOL PM) 25-500 MG TABS tablet Take 1 tablet by mouth every morning.     doxylamine, Sleep, (UNISOM) 25 MG tablet Take 25 mg by mouth at bedtime as needed.     gabapentin (NEURONTIN) 300 MG capsule Take 1 capsule (300 mg total) by mouth 3 (three) times daily. 90 capsule 0   hydrochlorothiazide (HYDRODIURIL) 25 MG tablet Take 1 tablet (25 mg total) by mouth daily. (Patient taking differently: Take 25 mg by mouth daily as needed (for edema). ) 90 tablet 3   hydrOXYzine (VISTARIL) 50 MG capsule TAKE 2 CAPSULES BY MOUTH 3 TIMES DAILY AS NEEDED FOR ANXIETY. 90 capsule 2   Multiple Vitamins-Minerals (AIRBORNE PO) Take by mouth.     NIFEdipine (PROCARDIA-XL/ADALAT-CC/NIFEDICAL-XL) 30 MG 24 hr tablet TAKE 1 TABLET BY MOUTH ONCE DAILY 90 tablet 0   NON FORMULARY Piracet 800 mg  tablets (memory aid): Take 1 tablet by mouth once a day or as otherwise instructed     Omega-3 Fatty Acids (FISH OIL PO) Take 1 capsule by mouth daily.     sodium chloride (OCEAN) 0.65 % SOLN nasal spray Place 1-2 sprays into both nostrils 3 (three) times daily as needed for congestion.     Current Facility-Administered Medications on File Prior to Visit  Medication Dose Route Frequency Provider Last Rate Last Admin   betamethasone acetate-betamethasone sodium phosphate (CELESTONE) injection 3 mg  3 mg Intramuscular Once Edrick Kins, DPM        Allergies  Allergen Reactions   Cephalosporins Itching   Nsaids Other (See Comments)    History of subdural hematoma   Penicillins Itching and Other (See Comments)    Welts (also) Has patient had a PCN reaction causing immediate rash, facial/tongue/throat swelling, SOB or lightheadedness with hypotension: Yes Has patient had a PCN reaction causing severe rash involving mucus membranes or skin necrosis: No Has patient had a PCN reaction that required hospitalization: Yes Has patient had a PCN reaction occurring within the last 10 years: No If all of the above answers are "NO", then may proceed with Cephalosporin use.     Objective:  General: Alert and oriented x3 in no acute distress with moments of emotions due to TBI  Dermatology: + thick  scar/keloid formation at plantar right foot with sensitivity there is a small raised soft tissue mass along the plantar fascia on the right arch consistent with fibroma with taut plantar fascia on the right  Plantar forefoot calluses bilateral with nucleated core sub met 1 and 5 bilateral. No signs of infection.   Right hallux and second toenails thickened with subungual debris likely consistent with fungus versus traumatic nail changes with no acute surrounding signs of infection.  Vascular: Dorsalis Pedis and Posterior Tibial pedal pulses intact, Temperature gradient within normal limits.  Neurology:  Johney Maine sensation intact via light touch bilateral.   Musculoskeletal: + tenderness to bilateral forefoot and R>L foot at arch and 1st toe joint, right greater than left.  Pes cavus foot type.  Compensation supination noted on the right foot with walking no frank instability but history of weakness at the right ankle which has improved with physical therapy.  Assessment and Plan:  Problem List Items Addressed This Visit   None Visit Diagnoses     Plantar fascial fibromatosis    -  Primary   Painful scar       Porokeratosis       Foot pain, bilateral       Ankle instability, right           -Complete examination performed -Mechanically debrided callus plantar forefoot bilateral x 4 using sterile chisel blade without incident -Discussed with patient extent of plantar fibroma on the right arch and at this time do not recommend repeat surgery would recommend physical therapy adding on iontophoresis/ultrasound to the area to help with pain or sensitivity -May return to using flector patches  -Continue with current orthotics and good supportive shoes daily for foot type -Dispensed formula 7 for patient to use as directed to right first and second toenails -Patient to return to office in 6 months for callus care and follow-up foot evaluation with Dr. Blenda Mounts or sooner if condition worsens. Patient suffers with PTSD and has anxiety about meeting new providers so she will have Estill Bamberg introduced her to Dr. Blenda Mounts to make her feel more comfortable.  Landis Martins, DPM

## 2022-09-03 ENCOUNTER — Telehealth: Payer: Self-pay | Admitting: Sports Medicine

## 2022-09-03 NOTE — Telephone Encounter (Signed)
Received call from [facility] stating they got a plan of care form back that stated Dr Cannon Kettle is no longer with our practice and she wanted to see if anyone else could sign the forms. I explained pt has not been seen since 5.25.2023 with Dr Cannon Kettle so no other provider is able to sign the forms. She would need to see another provider to get the forms signed.

## 2024-05-22 ENCOUNTER — Emergency Department (HOSPITAL_COMMUNITY)
Admission: EM | Admit: 2024-05-22 | Discharge: 2024-05-22 | Disposition: A | Discharge status: Home / Self Care | Attending: Emergency Medicine | Admitting: Emergency Medicine

## 2024-05-22 ENCOUNTER — Encounter (HOSPITAL_COMMUNITY): Payer: Self-pay

## 2024-05-22 ENCOUNTER — Other Ambulatory Visit: Payer: Self-pay

## 2024-05-22 ENCOUNTER — Emergency Department (HOSPITAL_COMMUNITY)

## 2024-05-22 DIAGNOSIS — R000 Tachycardia, unspecified: Secondary | ICD-10-CM | POA: Diagnosis not present

## 2024-05-22 DIAGNOSIS — J029 Acute pharyngitis, unspecified: Secondary | ICD-10-CM | POA: Insufficient documentation

## 2024-05-22 DIAGNOSIS — R059 Cough, unspecified: Secondary | ICD-10-CM | POA: Diagnosis present

## 2024-05-22 DIAGNOSIS — Y900 Blood alcohol level of less than 20 mg/100 ml: Secondary | ICD-10-CM | POA: Diagnosis not present

## 2024-05-22 DIAGNOSIS — Z720 Tobacco use: Secondary | ICD-10-CM | POA: Diagnosis not present

## 2024-05-22 DIAGNOSIS — R718 Other abnormality of red blood cells: Secondary | ICD-10-CM | POA: Diagnosis not present

## 2024-05-22 DIAGNOSIS — R451 Restlessness and agitation: Secondary | ICD-10-CM | POA: Diagnosis not present

## 2024-05-22 DIAGNOSIS — F419 Anxiety disorder, unspecified: Secondary | ICD-10-CM | POA: Diagnosis not present

## 2024-05-22 DIAGNOSIS — Z79899 Other long term (current) drug therapy: Secondary | ICD-10-CM | POA: Diagnosis not present

## 2024-05-22 DIAGNOSIS — I10 Essential (primary) hypertension: Secondary | ICD-10-CM | POA: Diagnosis not present

## 2024-05-22 DIAGNOSIS — E876 Hypokalemia: Secondary | ICD-10-CM | POA: Insufficient documentation

## 2024-05-22 DIAGNOSIS — R4789 Other speech disturbances: Secondary | ICD-10-CM | POA: Insufficient documentation

## 2024-05-22 DIAGNOSIS — F1210 Cannabis abuse, uncomplicated: Secondary | ICD-10-CM | POA: Diagnosis not present

## 2024-05-22 LAB — URINALYSIS, ROUTINE W REFLEX MICROSCOPIC
Bacteria, UA: NONE SEEN
Bilirubin Urine: NEGATIVE
Glucose, UA: NEGATIVE mg/dL
Ketones, ur: 80 mg/dL — AB
Nitrite: NEGATIVE
Protein, ur: 100 mg/dL — AB
Specific Gravity, Urine: 1.023 (ref 1.005–1.030)
pH: 5 (ref 5.0–8.0)

## 2024-05-22 LAB — RESP PANEL BY RT-PCR (RSV, FLU A&B, COVID)  RVPGX2
Influenza A by PCR: NEGATIVE
Influenza B by PCR: NEGATIVE
Resp Syncytial Virus by PCR: NEGATIVE
SARS Coronavirus 2 by RT PCR: NEGATIVE

## 2024-05-22 LAB — URINE DRUG SCREEN
Amphetamines: NEGATIVE
Barbiturates: NEGATIVE
Benzodiazepines: NEGATIVE
Cocaine: NEGATIVE
Fentanyl: NEGATIVE
Methadone Scn, Ur: NEGATIVE
Opiates: NEGATIVE
Tetrahydrocannabinol: POSITIVE — AB

## 2024-05-22 LAB — CBC
HCT: 47.2 % — ABNORMAL HIGH (ref 36.0–46.0)
Hemoglobin: 15.2 g/dL — ABNORMAL HIGH (ref 12.0–15.0)
MCH: 28.1 pg (ref 26.0–34.0)
MCHC: 32.2 g/dL (ref 30.0–36.0)
MCV: 87.2 fL (ref 80.0–100.0)
Platelets: 344 K/uL (ref 150–400)
RBC: 5.41 MIL/uL — ABNORMAL HIGH (ref 3.87–5.11)
RDW: 12.8 % (ref 11.5–15.5)
WBC: 10 K/uL (ref 4.0–10.5)
nRBC: 0 % (ref 0.0–0.2)

## 2024-05-22 LAB — LIPASE, BLOOD: Lipase: 15 U/L (ref 11–51)

## 2024-05-22 LAB — GROUP A STREP BY PCR: Group A Strep by PCR: NOT DETECTED

## 2024-05-22 LAB — COMPREHENSIVE METABOLIC PANEL WITH GFR
ALT: 26 U/L (ref 0–44)
AST: 31 U/L (ref 15–41)
Albumin: 4.7 g/dL (ref 3.5–5.0)
Alkaline Phosphatase: 77 U/L (ref 38–126)
Anion gap: 21 — ABNORMAL HIGH (ref 5–15)
BUN: 11 mg/dL (ref 8–23)
CO2: 20 mmol/L — ABNORMAL LOW (ref 22–32)
Calcium: 9.9 mg/dL (ref 8.9–10.3)
Chloride: 99 mmol/L (ref 98–111)
Creatinine, Ser: 0.72 mg/dL (ref 0.44–1.00)
GFR, Estimated: >60 mL/min (ref >60)
Glucose, Bld: 128 mg/dL — ABNORMAL HIGH (ref 70–99)
Potassium: 3.3 mmol/L — ABNORMAL LOW (ref 3.5–5.1)
Sodium: 140 mmol/L (ref 135–145)
Total Bilirubin: 0.8 mg/dL (ref 0.0–1.2)
Total Protein: 7.8 g/dL (ref 6.5–8.1)

## 2024-05-22 LAB — D-DIMER, QUANTITATIVE: D-Dimer, Quant: <0.27 ug{FEU}/mL (ref 0.00–0.50)

## 2024-05-22 LAB — ETHANOL: Alcohol, Ethyl (B): <15 mg/dL (ref <15)

## 2024-05-22 MED ORDER — SODIUM CHLORIDE 0.9 % IV BOLUS
1000.0000 mL | Freq: Once | INTRAVENOUS | Status: AC
  Administered 2024-05-22: 1000 mL via INTRAVENOUS

## 2024-05-22 MED ORDER — LORAZEPAM 0.5 MG PO TABS
0.5000 mg | ORAL_TABLET | Freq: Once | ORAL | Status: AC | PRN
Start: 2024-05-22 — End: 2024-05-22
  Administered 2024-05-22: 0.5 mg via ORAL
  Filled 2024-05-22: qty 1

## 2024-05-22 MED ORDER — HYDROXYZINE HCL 25 MG PO TABS
25.0000 mg | ORAL_TABLET | Freq: Four times a day (QID) | ORAL | 0 refills | Status: DC
  Filled 2024-05-22: qty 12, 3d supply, fill #0

## 2024-05-22 MED ORDER — DEXAMETHASONE 4 MG PO TABS
6.0000 mg | ORAL_TABLET | Freq: Once | ORAL | Status: AC
  Administered 2024-05-22: 6 mg via ORAL
  Filled 2024-05-22: qty 1

## 2024-05-22 MED ORDER — HYDROXYZINE HCL 25 MG PO TABS
25.0000 mg | ORAL_TABLET | Freq: Four times a day (QID) | ORAL | 0 refills | Status: AC
Start: 2024-05-22 — End: ?

## 2024-05-22 NOTE — ED Notes (Addendum)
 Patient is extremely anxious and will not stop talking for RN to ask  Questions.  Family member is trying to get patient to calm down.

## 2024-05-22 NOTE — ED Triage Notes (Addendum)
 Patient reports feeling malaise and like I have mono x 4 months.  Reports this past week having severe sore throat and then today has been coughing so hard she vomits.  Throat is red and irritated but tonsils are not swollen nor exudate. Reports been taking tylenol  motrin and gargling salt water and taking some type of garlic. Sick exposure per mother

## 2024-05-22 NOTE — ED Provider Notes (Signed)
 [place] [facility] AT [facility] Provider Note   CSN: [id number] Arrival date & time: 05/22/24  9082     Patient presents with: Sore Throat and Fatigue   Joy Barber is a 63 y.o. female.   63 year old female presenting with multiple complaints.  Patient tells me that for 4 months she has had significant fatigue, I feel like I have mono, I cannot get up and do anything, denies shortness of breath/chest pain/lower extremity swelling.  For approximately 1 week she endorses a sore throat, tells me that her tonsils were enlarged and had white spots on them, she has been gargling salt water and eating garlic with every meal to help with her sore throat and blood pressure.  She also tells me she has been hacking so much that it has caused her to vomit, she did notice a trace amount of blood in her vomit.  She tells me that she has lost a significant amount of weight over the last 4 months.  She tells me that she has a history of absence seizure's, has been off of her medications for a long time, she tells me I do not think they were helping me, when asked about this she tells me that she continues to have these episodes, stating maybe I should restart my medications.   Sore Throat       Prior to Admission medications   Medication Sig Start Date End Date Taking? Authorizing Provider  Ascorbic Acid (VITAMIN C PO) Take 1 tablet by mouth daily.    [provider]  busPIRone  (BUSPAR ) 15 MG tablet Take 2 tablets (30 mg total) by mouth 2 (two) times daily. 08/12/18   Arloa Suzen RAMAN, NP  diclofenac  (FLECTOR ) 1.3 % PTCH Place 1 patch onto the skin 2 (two) times daily. 02/16/19   Stover, Titorya, DPM  diphenhydramine -acetaminophen  (TYLENOL  PM) 25-500 MG TABS tablet Take 1 tablet by mouth every morning.    [provider]  doxylamine, Sleep, (UNISOM) 25 MG tablet Take 25 mg by mouth at bedtime as needed.    [provider]   gabapentin  (NEURONTIN ) 300 MG capsule Take 1 capsule (300 mg total) by mouth 3 (three) times daily. 08/12/18   Arloa Suzen RAMAN, NP  hydrochlorothiazide  (HYDRODIURIL ) 25 MG tablet Take 1 tablet (25 mg total) by mouth daily. Patient taking differently: Take 25 mg by mouth daily as needed (for edema).  01/31/17   Arloa Suzen RAMAN, NP  hydrOXYzine  (VISTARIL ) 50 MG capsule TAKE 2 CAPSULES BY MOUTH 3 TIMES DAILY AS NEEDED FOR ANXIETY. 05/19/18   Doles-Johnson, Teah, NP  Multiple Vitamins-Minerals (AIRBORNE PO) Take by mouth.    [provider]  NIFEdipine  (PROCARDIA -XL/ADALAT -CC/NIFEDICAL-XL) 30 MG 24 hr tablet TAKE 1 TABLET BY MOUTH ONCE DAILY 06/02/18   Fulp, Cammie, MD  NON FORMULARY Piracet 800 mg tablets (memory aid): Take 1 tablet by mouth once a day or as otherwise instructed    [provider]  Omega-3 Fatty Acids (FISH OIL PO) Take 1 capsule by mouth daily.    [provider]  sodium chloride  (OCEAN) 0.65 % SOLN nasal spray Place 1-2 sprays into both nostrils 3 (three) times daily as needed for congestion.    [provider]    Allergies: Cephalosporins, Nsaids, and Penicillins    Review of Systems  Updated Vital Signs  Vitals:   05/22/24 1030 05/22/24 1132 05/22/24 1208 05/22/24 1408  BP: (!) 143/97  (!) 154/93   Pulse: (!) 104  ROLLEN)  103   Resp: 12  (!) 21   Temp:    98.5 F (36.9 C)  TempSrc:    Oral  SpO2:   100%   Weight:  56.8 kg    Height:         Physical Exam Vitals and nursing note reviewed.  HENT:     Head: Normocephalic.     Mouth/Throat:     Comments: Mildly erythematous oropharynx, no appreciable tonsillar swelling/exudate, uvula midline Eyes:     Extraocular Movements: Extraocular movements intact.     Pupils: Pupils are equal, round, and reactive to light.  Cardiovascular:     Rate and Rhythm: Regular rhythm. Tachycardia present.  Pulmonary:     Effort: Pulmonary effort is normal.  Abdominal:     Palpations: Abdomen  is soft.  Musculoskeletal:     Cervical back: Normal range of motion and neck supple. No rigidity.     Right lower leg: No edema.     Left lower leg: No edema.     Comments: Moves all extremities spontaneously without difficulty  Lymphadenopathy:     Cervical: No cervical adenopathy.  Skin:    General: Skin is warm and dry.  Neurological:     Mental Status: She is alert and oriented to person, place, and time.  Psychiatric:        Mood and Affect: Mood is anxious.        Speech: Speech is rapid and pressured.        Behavior: Behavior is agitated.     (all labs ordered are listed, but only abnormal results are displayed) Labs Reviewed  CBC - Abnormal; Notable for the following components:      Result Value   RBC 5.41 (*)    Hemoglobin 15.2 (*)    HCT 47.2 (*)    All other components within normal limits  COMPREHENSIVE METABOLIC PANEL WITH GFR - Abnormal; Notable for the following components:   Potassium 3.3 (*)    CO2 20 (*)    Glucose, Bld 128 (*)    Anion gap 21 (*)    All other components within normal limits  URINALYSIS, ROUTINE W REFLEX MICROSCOPIC - Abnormal; Notable for the following components:   Hgb urine dipstick SMALL (*)    Ketones, ur 80 (*)    Protein, ur 100 (*)    Leukocytes,Ua TRACE (*)    All other components within normal limits  URINE DRUG SCREEN - Abnormal; Notable for the following components:   Tetrahydrocannabinol POSITIVE (*)    All other components within normal limits  RESP PANEL BY RT-PCR (RSV, FLU A&B, COVID)  [id number]  GROUP A STREP BY PCR  D-DIMER, QUANTITATIVE  LIPASE, BLOOD  ETHANOL    EKG: EKG Interpretation Date/Time:  Saturday May 22 2024 09:38:39 EDT Ventricular Rate:  126 PR Interval:  151 QRS Duration:  103 QT Interval:  311 QTC Calculation: 451 R Axis:   36  Text Interpretation: Sinus tachycardia Confirmed by Bernard Drivers ([id number]) on 05/22/2024 10:02:14 AM  Radiology: DG Chest 2 View Result Date: 05/22/2024 EXAM: 2  VIEW(S) XRAY OF THE CHEST 05/22/2024 09:55:00 AM COMPARISON: 12/15/2014 CLINICAL HISTORY: Cough. Per chart: Patient reports feeling malaise and like I have mono x 4 months. Reports this past week having severe sore throat and then today has been coughing so hard she vomits. Throat is red and irritated but tonsils are not swollen nor exudate. Reports been taking tylenol  motrin and gargling salt water and taking  some type of garlic. FINDINGS: LUNGS AND PLEURA: No focal pulmonary opacity. No pulmonary edema. No pleural effusion. No pneumothorax. HEART AND MEDIASTINUM: No acute abnormality of the cardiac and mediastinal silhouettes. BONES AND SOFT TISSUES: No acute osseous abnormality. IMPRESSION: 1. No acute process. Electronically signed by: Norman Gatlin MD 05/22/2024 10:00 AM EDT RP Workstation: [id number]     Procedures   Medications Ordered in the ED  sodium chloride  0.9 % bolus 1,000 mL (1,000 mLs Intravenous New Bag/Given 05/22/24 1045)  LORazepam  (ATIVAN ) tablet 0.5 mg (0.5 mg Oral Given 05/22/24 1124)  dexamethasone  (DECADRON ) tablet 6 mg (6 mg Oral Given 05/22/24 1435)                                    Medical Decision Making This patient presents to the ED for concern of multiple complaints, this involves an extensive number of treatment options, and is a complaint that carries with it a high risk of complications and morbidity.  The differential diagnosis includes COVID/flu/RSV, strep pharyngitis, pneumonia, electrolyte disturbance   Co morbidities that complicate the patient evaluation  History of absence seizures   Additional history obtained:  There are no recent PCP notes available for review  Lab Tests:  I Ordered, and personally interpreted labs.  The pertinent results include: COVID/flu/RSV negative, strep negative.  CBC notable for mild elevation in RBC/hemoglobin/hematocrit, I have no recent baseline for comparison.  CMP notable for borderline hypokalemia with  potassium of 3.3, otherwise unremarkable.  D-dimer negative.  Ethanol level undetectable.  Lipase within normal limits.  Urinalysis notable for small RBCs with ketones/protein, I suspect that this is secondary to dehydration as patient admits to poor oral intake, trace leukocytes however negative nitrites, she does not endorse dysuria so I did feel that this is reflective of a urinary tract infection.  UDS positive for THC.   Imaging Studies ordered:  I ordered imaging studies including CXR  I independently visualized and interpreted imaging which showed 1. No acute process. I agree with the radiologist interpretation   Cardiac Monitoring: / EKG:  The patient was maintained on a cardiac monitor.  I personally viewed and interpreted the cardiac monitored which showed an underlying rhythm of: Sinus tachycardia   Problem List / ED Course / Critical interventions / Medication management  I ordered medication including Ativan  for anxiety, IV fluids for rehydration, decadron  for pharyngitis Reevaluation of the patient after these medicines showed that the patient improved I have reviewed the patients home medicines and have made adjustments as needed   Social Determinants of Health:  Tobacco use   Test / Admission - Considered:  Physical exam notable as above.  Patient is tachycardic however this did improve after fluid bolus, D-dimer negative so I do not feel that further imaging is necessary to workup for pulmonary embolism, she denies chest pain/shortness of breath.  She is also hypertensive, this has also improved on its own without intervention throughout her stay in the emergency department today.  She has been told that she has hypertension in the past, I encouraged her to discuss this with her primary care provider when she establishes care. She complains of sore throat, however no tonsillar swelling/exudate, uvula midline, no evidence of tonsillar/retropharyngeal abscess, neck is  supple without obvious palpable cervical lymphadenopathy.  I provided her with 6 mg of Decadron  p.o. for sore throat.   She expresses a multitude of complaints but primarily  notes feelings of malaise for several months as well as weight loss, however she does tell me that she does not eat very much, she is also very agitated and telling me repeatedly that she needs to speak with the insurance lady as my medical record numbers do not match, she has papers scattered all over the bed with information on previous diagnoses that she has received from prior hospitalizations/neurologists/providers.   She is not established with a primary care provider nor does she appear to be taking any medications daily aside from supplements that she gets over-the-counter.  She is extremely anxious, was given 0.5 of Ativan  for anxiety, she was aware of this medication before it was given to her however she tells me I do not do benzodiazepines, she is requesting medication for anxiety, I discussed with her in depth that she needs to establish care with a primary care provider, as she may need to be on long-term medications for control of her symptoms.  She was previously on BuSpar .  I recommended hydroxyzine , as this is something she has used previously and felt she benefited from, will send prescription to her pharmacy.   She does demonstrate some symptoms consistent with mania, including rapid/pressured speech, however she is not demonstrating any aggressive behavior nor do I feel that she is at risk of harm to herself or others, she is accompanied by her mother at her visit today who tells me that this behavior is normal for her.  She requests a copy of all of my test results from today, I relayed this information to nursing staff. Workup is largely unremarkable as above.  I will provide the patient with the contact information for [facility] in order to establish care with a primary care  provider, she is in agreement with this plan.  Return precautions discussed.  She is appropriate for discharge at this time.    Amount and/or Complexity of Data Reviewed Labs: ordered. Radiology: ordered.  Risk Prescription drug management.        Final diagnoses:  Sore throat  Anxiety    ED Discharge Orders          Ordered    hydrOXYzine  (ATARAX ) 25 MG tablet  Every 6 hours        05/22/24 1505               Glendia Rocky SAILOR, PA-C 05/22/24 1516    Bernard Drivers, MD 05/23/24 [phone]

## 2024-05-22 NOTE — Discharge Instructions (Addendum)
 Your testing for strep throat/COVID/flu/RSV was negative today.  If your sore throat persists, try chloraseptic throat spray for relief of your symptoms, this can be found over the counter.  Start hydroxyzine , use every 6 hours as needed for anxiety.  I have provided you with the contact information for [facility], please schedule follow-up to establish care for management of your chronic health conditions.

## 2024-05-23 ENCOUNTER — Other Ambulatory Visit: Payer: Self-pay

## 2024-05-25 ENCOUNTER — Other Ambulatory Visit (HOSPITAL_COMMUNITY): Payer: Self-pay
